# Patient Record
Sex: Male | Born: 1945 | Race: White | Hispanic: No | Marital: Married | Smoking: Former smoker
Health system: Southern US, Community
[De-identification: ages and names within clinical notes are randomized; demographics above are authoritative.]

## PROBLEM LIST (undated history)

## (undated) DIAGNOSIS — G709 Myoneural disorder, unspecified: Secondary | ICD-10-CM

## (undated) DIAGNOSIS — E7800 Pure hypercholesterolemia, unspecified: Secondary | ICD-10-CM

## (undated) DIAGNOSIS — I10 Essential (primary) hypertension: Secondary | ICD-10-CM

## (undated) DIAGNOSIS — R7303 Prediabetes: Secondary | ICD-10-CM

## (undated) DIAGNOSIS — M1990 Unspecified osteoarthritis, unspecified site: Secondary | ICD-10-CM

## (undated) DIAGNOSIS — Z87442 Personal history of urinary calculi: Secondary | ICD-10-CM

## (undated) DIAGNOSIS — C187 Malignant neoplasm of sigmoid colon: Secondary | ICD-10-CM

## (undated) HISTORY — DX: Pure hypercholesterolemia, unspecified: E78.00

## (undated) HISTORY — PX: COLON SURGERY: SHX602

## (undated) HISTORY — PX: PORTACATH PLACEMENT: SHX2246

## (undated) HISTORY — DX: Essential (primary) hypertension: I10

## (undated) HISTORY — DX: Malignant neoplasm of sigmoid colon: C18.7

---

## 2005-07-11 ENCOUNTER — Ambulatory Visit (HOSPITAL_COMMUNITY): Admission: RE | Admit: 2005-07-11 | Discharge: 2005-07-11 | Payer: Self-pay | Admitting: Urology

## 2005-07-13 ENCOUNTER — Observation Stay (HOSPITAL_COMMUNITY): Admission: RE | Admit: 2005-07-13 | Discharge: 2005-07-15 | Payer: Self-pay | Admitting: Urology

## 2005-07-13 ENCOUNTER — Encounter (INDEPENDENT_AMBULATORY_CARE_PROVIDER_SITE_OTHER): Payer: Self-pay | Admitting: Urology

## 2005-07-16 ENCOUNTER — Ambulatory Visit: Payer: Self-pay | Admitting: Internal Medicine

## 2005-07-18 ENCOUNTER — Ambulatory Visit: Payer: Self-pay | Admitting: Internal Medicine

## 2005-07-18 ENCOUNTER — Ambulatory Visit (HOSPITAL_COMMUNITY): Admission: RE | Admit: 2005-07-18 | Discharge: 2005-07-18 | Payer: Self-pay | Admitting: Internal Medicine

## 2005-07-24 ENCOUNTER — Inpatient Hospital Stay (HOSPITAL_COMMUNITY): Admission: AD | Admit: 2005-07-24 | Discharge: 2005-08-02 | Payer: Self-pay | Admitting: General Surgery

## 2005-07-25 ENCOUNTER — Encounter (INDEPENDENT_AMBULATORY_CARE_PROVIDER_SITE_OTHER): Payer: Self-pay | Admitting: General Surgery

## 2005-08-13 ENCOUNTER — Ambulatory Visit (HOSPITAL_COMMUNITY): Payer: Self-pay | Admitting: Oncology

## 2005-08-13 ENCOUNTER — Encounter (HOSPITAL_COMMUNITY): Admission: RE | Admit: 2005-08-13 | Discharge: 2005-09-12 | Payer: Self-pay | Admitting: Oncology

## 2005-08-13 ENCOUNTER — Encounter: Admission: RE | Admit: 2005-08-13 | Discharge: 2005-08-13 | Payer: Self-pay | Admitting: Oncology

## 2005-08-20 ENCOUNTER — Ambulatory Visit (HOSPITAL_COMMUNITY): Admission: RE | Admit: 2005-08-20 | Discharge: 2005-08-20 | Payer: Self-pay | Admitting: Oncology

## 2005-08-24 ENCOUNTER — Ambulatory Visit: Admission: RE | Admit: 2005-08-24 | Discharge: 2005-10-19 | Payer: Self-pay | Admitting: Radiation Oncology

## 2005-08-29 ENCOUNTER — Ambulatory Visit (HOSPITAL_COMMUNITY): Admission: RE | Admit: 2005-08-29 | Discharge: 2005-08-29 | Payer: Self-pay | Admitting: General Surgery

## 2005-09-24 ENCOUNTER — Encounter (HOSPITAL_COMMUNITY): Admission: RE | Admit: 2005-09-24 | Discharge: 2005-10-24 | Payer: Self-pay | Admitting: Oncology

## 2005-09-24 ENCOUNTER — Encounter: Admission: RE | Admit: 2005-09-24 | Discharge: 2005-09-24 | Payer: Self-pay | Admitting: Oncology

## 2005-10-01 ENCOUNTER — Ambulatory Visit (HOSPITAL_COMMUNITY): Payer: Self-pay | Admitting: Oncology

## 2005-10-29 ENCOUNTER — Encounter: Admission: RE | Admit: 2005-10-29 | Discharge: 2005-10-29 | Payer: Self-pay | Admitting: Oncology

## 2005-10-29 ENCOUNTER — Encounter (HOSPITAL_COMMUNITY): Admission: RE | Admit: 2005-10-29 | Discharge: 2005-11-28 | Payer: Self-pay | Admitting: Oncology

## 2005-11-19 ENCOUNTER — Ambulatory Visit (HOSPITAL_COMMUNITY): Payer: Self-pay | Admitting: Oncology

## 2005-12-11 ENCOUNTER — Encounter: Admission: RE | Admit: 2005-12-11 | Discharge: 2005-12-11 | Payer: Self-pay | Admitting: Oncology

## 2005-12-11 ENCOUNTER — Encounter (HOSPITAL_COMMUNITY): Admission: RE | Admit: 2005-12-11 | Discharge: 2006-01-10 | Payer: Self-pay | Admitting: Oncology

## 2006-01-14 ENCOUNTER — Encounter: Admission: RE | Admit: 2006-01-14 | Discharge: 2006-01-14 | Payer: Self-pay | Admitting: Oncology

## 2006-01-14 ENCOUNTER — Encounter (HOSPITAL_COMMUNITY): Admission: RE | Admit: 2006-01-14 | Discharge: 2006-02-13 | Payer: Self-pay | Admitting: Oncology

## 2006-02-04 ENCOUNTER — Ambulatory Visit (HOSPITAL_COMMUNITY): Payer: Self-pay | Admitting: Oncology

## 2006-02-19 ENCOUNTER — Encounter: Admission: RE | Admit: 2006-02-19 | Discharge: 2006-02-19 | Payer: Self-pay | Admitting: Oncology

## 2006-02-19 ENCOUNTER — Encounter (HOSPITAL_COMMUNITY): Admission: RE | Admit: 2006-02-19 | Discharge: 2006-03-21 | Payer: Self-pay | Admitting: Oncology

## 2006-03-21 ENCOUNTER — Ambulatory Visit: Payer: Self-pay | Admitting: Internal Medicine

## 2006-03-25 ENCOUNTER — Ambulatory Visit (HOSPITAL_COMMUNITY): Admission: RE | Admit: 2006-03-25 | Discharge: 2006-03-25 | Payer: Self-pay | Admitting: Oncology

## 2006-03-26 ENCOUNTER — Encounter: Admission: RE | Admit: 2006-03-26 | Discharge: 2006-03-26 | Payer: Self-pay | Admitting: Oncology

## 2006-03-26 ENCOUNTER — Encounter (HOSPITAL_COMMUNITY): Admission: RE | Admit: 2006-03-26 | Discharge: 2006-04-25 | Payer: Self-pay | Admitting: Oncology

## 2006-03-26 ENCOUNTER — Ambulatory Visit (HOSPITAL_COMMUNITY): Payer: Self-pay | Admitting: Oncology

## 2006-03-28 ENCOUNTER — Ambulatory Visit (HOSPITAL_COMMUNITY): Admission: RE | Admit: 2006-03-28 | Discharge: 2006-03-28 | Payer: Self-pay | Admitting: Urology

## 2006-04-02 ENCOUNTER — Ambulatory Visit (HOSPITAL_COMMUNITY): Admission: RE | Admit: 2006-04-02 | Discharge: 2006-04-02 | Payer: Self-pay | Admitting: Urology

## 2006-05-07 ENCOUNTER — Ambulatory Visit: Payer: Self-pay | Admitting: Internal Medicine

## 2006-05-07 ENCOUNTER — Ambulatory Visit (HOSPITAL_COMMUNITY): Admission: RE | Admit: 2006-05-07 | Discharge: 2006-05-07 | Payer: Self-pay | Admitting: Internal Medicine

## 2006-05-15 ENCOUNTER — Ambulatory Visit (HOSPITAL_COMMUNITY): Payer: Self-pay | Admitting: Oncology

## 2006-05-31 ENCOUNTER — Ambulatory Visit (HOSPITAL_COMMUNITY): Admission: RE | Admit: 2006-05-31 | Discharge: 2006-05-31 | Payer: Self-pay | Admitting: Urology

## 2006-06-27 ENCOUNTER — Encounter (HOSPITAL_COMMUNITY): Admission: RE | Admit: 2006-06-27 | Discharge: 2006-07-27 | Payer: Self-pay | Admitting: Oncology

## 2006-08-12 ENCOUNTER — Ambulatory Visit (HOSPITAL_COMMUNITY): Payer: Self-pay | Admitting: Oncology

## 2006-09-27 ENCOUNTER — Ambulatory Visit (HOSPITAL_COMMUNITY): Payer: Self-pay | Admitting: Oncology

## 2006-09-27 ENCOUNTER — Encounter (HOSPITAL_COMMUNITY): Admission: RE | Admit: 2006-09-27 | Discharge: 2006-10-27 | Payer: Self-pay | Admitting: Oncology

## 2006-10-30 ENCOUNTER — Ambulatory Visit (HOSPITAL_COMMUNITY): Admission: RE | Admit: 2006-10-30 | Discharge: 2006-10-30 | Payer: Self-pay | Admitting: Oncology

## 2006-11-04 ENCOUNTER — Encounter (HOSPITAL_COMMUNITY): Admission: RE | Admit: 2006-11-04 | Discharge: 2006-12-04 | Payer: Self-pay | Admitting: Oncology

## 2006-12-19 ENCOUNTER — Ambulatory Visit (HOSPITAL_COMMUNITY): Payer: Self-pay | Admitting: Oncology

## 2007-01-31 ENCOUNTER — Encounter (HOSPITAL_COMMUNITY): Admission: RE | Admit: 2007-01-31 | Discharge: 2007-03-02 | Payer: Self-pay | Admitting: Oncology

## 2007-03-14 ENCOUNTER — Ambulatory Visit (HOSPITAL_COMMUNITY): Payer: Self-pay | Admitting: Oncology

## 2007-04-22 ENCOUNTER — Encounter (HOSPITAL_COMMUNITY): Admission: RE | Admit: 2007-04-22 | Discharge: 2007-05-22 | Payer: Self-pay | Admitting: Oncology

## 2007-04-28 ENCOUNTER — Ambulatory Visit (HOSPITAL_COMMUNITY): Admission: RE | Admit: 2007-04-28 | Discharge: 2007-04-28 | Payer: Self-pay | Admitting: Oncology

## 2007-05-09 ENCOUNTER — Ambulatory Visit (HOSPITAL_COMMUNITY): Payer: Self-pay | Admitting: Oncology

## 2007-07-15 ENCOUNTER — Encounter (HOSPITAL_COMMUNITY): Admission: RE | Admit: 2007-07-15 | Discharge: 2007-08-14 | Payer: Self-pay | Admitting: Oncology

## 2007-07-15 ENCOUNTER — Ambulatory Visit (HOSPITAL_COMMUNITY): Payer: Self-pay | Admitting: Oncology

## 2007-08-01 IMAGING — CR DG CHEST 2V
2 series · 2 of 2 positions shown · non-contrast
Comparison: None.

CLINICAL DATA: Pre-op evaluation for colon cancer.  
 CHEST - 2 VIEW:

[view not recorded (1 of 2)]
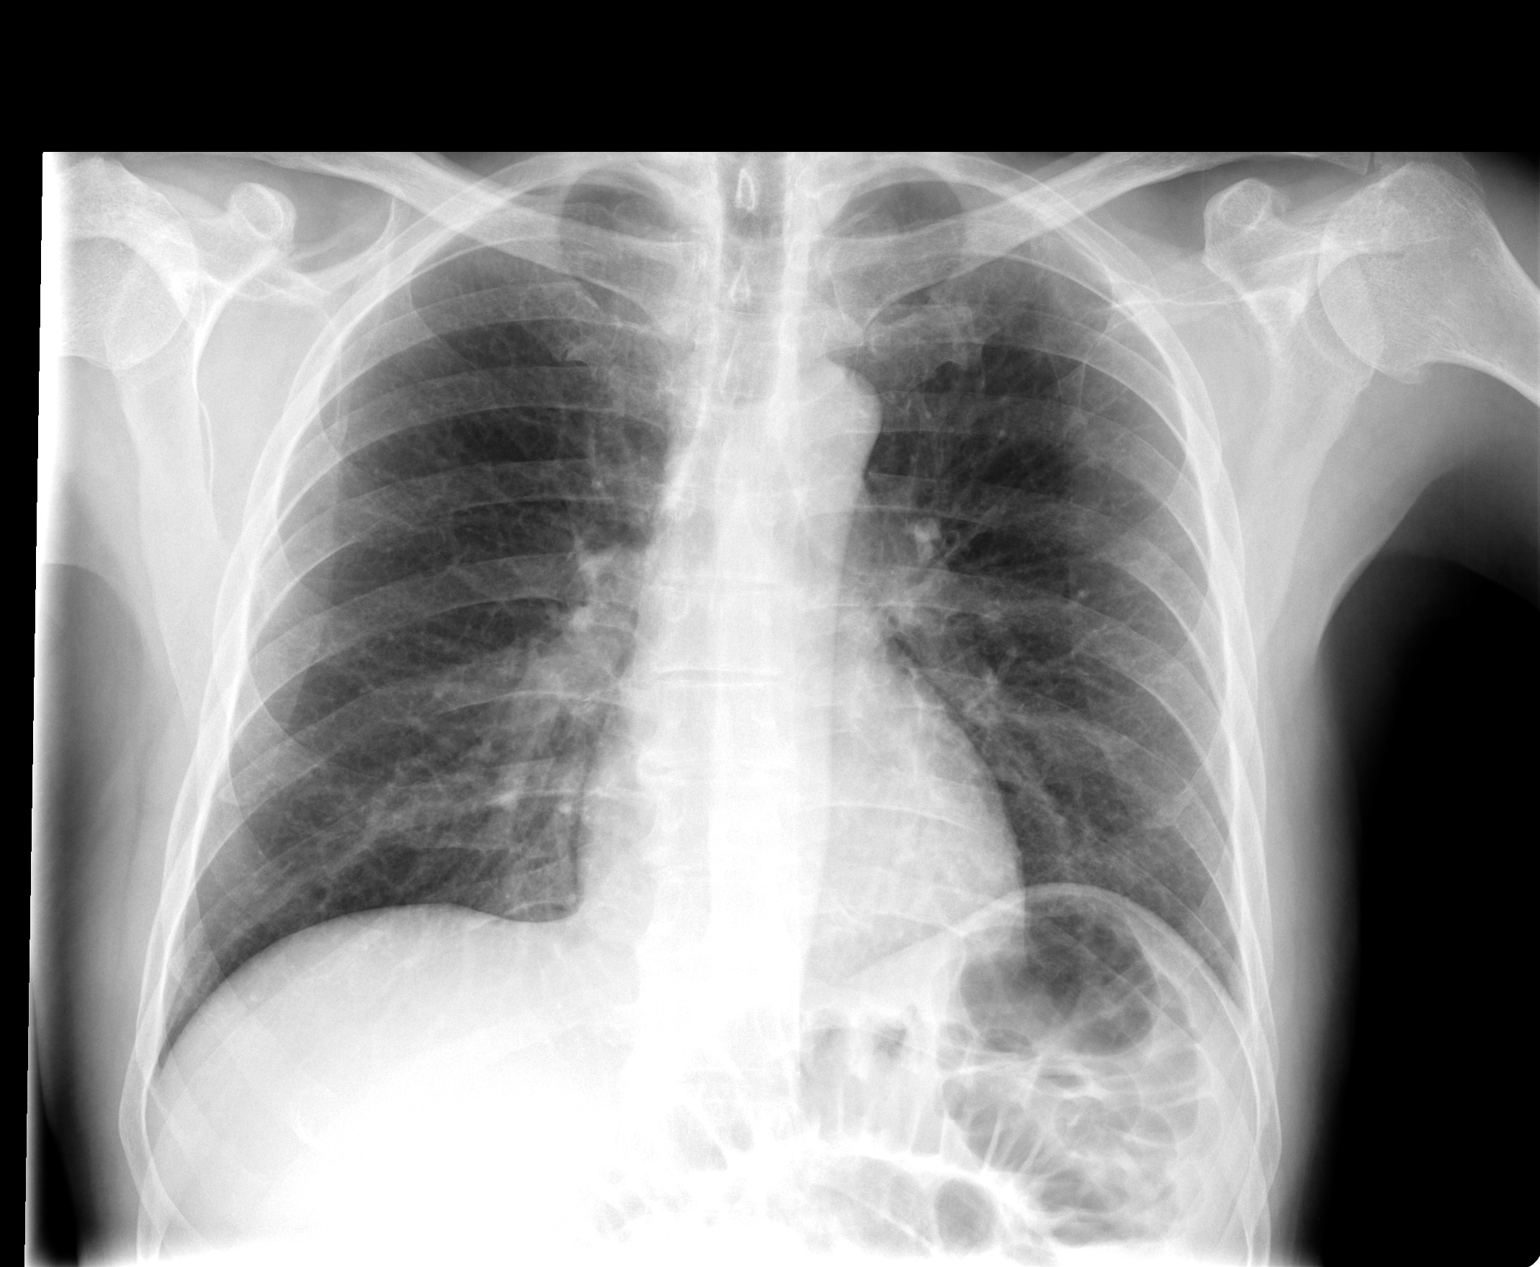

[view not recorded (2 of 2)]
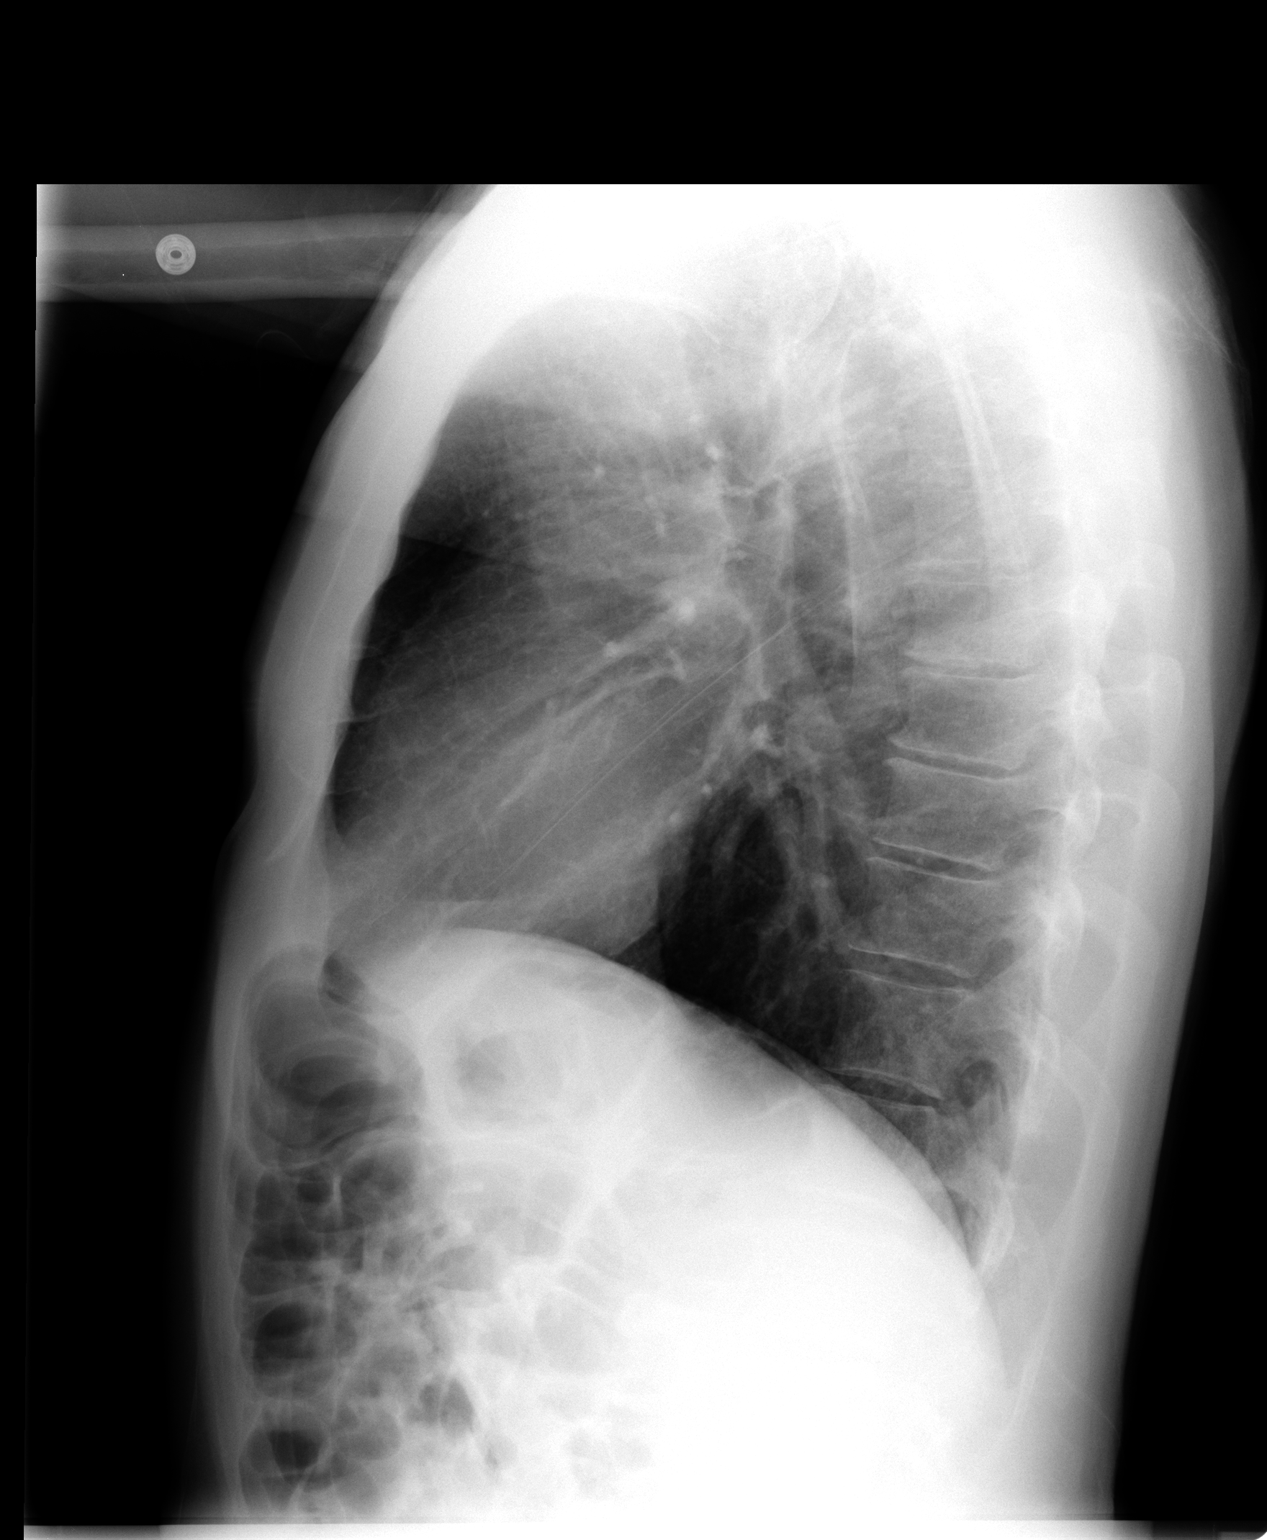

[2 of 2 positions shown; findings below may reference images not displayed]

Lungs are clear without focal consolidation, edema, or pleural effusion.  The cardiopericardial silhouette is within normal limits for size.  Bony structures of the visualized thorax are intact.
IMPRESSION: No acute cardiopulmonary process.

## 2007-10-07 ENCOUNTER — Ambulatory Visit (HOSPITAL_COMMUNITY): Payer: Self-pay | Admitting: Oncology

## 2007-10-07 ENCOUNTER — Encounter (HOSPITAL_COMMUNITY): Admission: RE | Admit: 2007-10-07 | Discharge: 2007-11-06 | Payer: Self-pay | Admitting: Oncology

## 2007-12-02 ENCOUNTER — Ambulatory Visit (HOSPITAL_COMMUNITY): Payer: Self-pay | Admitting: Oncology

## 2007-12-30 ENCOUNTER — Encounter (HOSPITAL_COMMUNITY): Admission: RE | Admit: 2007-12-30 | Discharge: 2008-01-29 | Payer: Self-pay | Admitting: Oncology

## 2008-02-13 ENCOUNTER — Encounter (HOSPITAL_COMMUNITY): Admission: RE | Admit: 2008-02-13 | Discharge: 2008-03-14 | Payer: Self-pay | Admitting: Oncology

## 2008-02-13 ENCOUNTER — Ambulatory Visit (HOSPITAL_COMMUNITY): Payer: Self-pay | Admitting: Oncology

## 2008-03-23 ENCOUNTER — Encounter (HOSPITAL_COMMUNITY): Admission: RE | Admit: 2008-03-23 | Discharge: 2008-04-22 | Payer: Self-pay | Admitting: Oncology

## 2008-05-07 ENCOUNTER — Ambulatory Visit (HOSPITAL_COMMUNITY): Payer: Self-pay | Admitting: Oncology

## 2008-06-09 ENCOUNTER — Encounter (HOSPITAL_COMMUNITY): Admission: RE | Admit: 2008-06-09 | Discharge: 2008-07-09 | Payer: Self-pay | Admitting: Oncology

## 2008-07-21 ENCOUNTER — Ambulatory Visit (HOSPITAL_COMMUNITY): Payer: Self-pay | Admitting: Oncology

## 2008-09-01 ENCOUNTER — Encounter (HOSPITAL_COMMUNITY): Admission: RE | Admit: 2008-09-01 | Discharge: 2008-10-01 | Payer: Self-pay | Admitting: Oncology

## 2008-10-14 ENCOUNTER — Ambulatory Visit (HOSPITAL_COMMUNITY): Payer: Self-pay | Admitting: Oncology

## 2008-12-08 ENCOUNTER — Ambulatory Visit (HOSPITAL_COMMUNITY): Payer: Self-pay | Admitting: Oncology

## 2008-12-08 ENCOUNTER — Encounter (HOSPITAL_COMMUNITY): Admission: RE | Admit: 2008-12-08 | Discharge: 2009-01-07 | Payer: Self-pay | Admitting: Oncology

## 2009-01-20 ENCOUNTER — Encounter (HOSPITAL_COMMUNITY): Admission: RE | Admit: 2009-01-20 | Discharge: 2009-02-19 | Payer: Self-pay | Admitting: Oncology

## 2009-03-01 ENCOUNTER — Ambulatory Visit (HOSPITAL_COMMUNITY): Payer: Self-pay | Admitting: Oncology

## 2009-04-14 ENCOUNTER — Encounter (HOSPITAL_COMMUNITY): Admission: RE | Admit: 2009-04-14 | Discharge: 2009-05-14 | Payer: Self-pay | Admitting: Oncology

## 2009-06-02 ENCOUNTER — Ambulatory Visit (HOSPITAL_COMMUNITY): Payer: Self-pay | Admitting: Oncology

## 2009-07-01 ENCOUNTER — Encounter (HOSPITAL_COMMUNITY): Admission: RE | Admit: 2009-07-01 | Discharge: 2009-07-31 | Payer: Self-pay | Admitting: Oncology

## 2009-08-26 ENCOUNTER — Ambulatory Visit (HOSPITAL_COMMUNITY): Payer: Self-pay | Admitting: Oncology

## 2009-10-07 ENCOUNTER — Encounter (HOSPITAL_COMMUNITY): Admission: RE | Admit: 2009-10-07 | Discharge: 2009-11-06 | Payer: Self-pay | Admitting: Oncology

## 2009-11-18 ENCOUNTER — Ambulatory Visit (HOSPITAL_COMMUNITY): Payer: Self-pay | Admitting: Oncology

## 2009-12-27 ENCOUNTER — Encounter (HOSPITAL_COMMUNITY): Admission: RE | Admit: 2009-12-27 | Discharge: 2010-01-26 | Payer: Self-pay | Admitting: Oncology

## 2010-02-07 ENCOUNTER — Ambulatory Visit (HOSPITAL_COMMUNITY): Payer: Self-pay | Admitting: Oncology

## 2010-03-21 ENCOUNTER — Encounter (HOSPITAL_COMMUNITY): Admission: RE | Admit: 2010-03-21 | Discharge: 2010-03-24 | Payer: Self-pay | Admitting: Oncology

## 2010-05-02 ENCOUNTER — Encounter (HOSPITAL_COMMUNITY)
Admission: RE | Admit: 2010-05-02 | Discharge: 2010-06-01 | Payer: Self-pay | Source: Home / Self Care | Attending: Oncology | Admitting: Oncology

## 2010-05-02 ENCOUNTER — Ambulatory Visit (HOSPITAL_COMMUNITY): Payer: Self-pay | Admitting: Oncology

## 2010-06-13 ENCOUNTER — Encounter (HOSPITAL_COMMUNITY)
Admission: RE | Admit: 2010-06-13 | Discharge: 2010-07-13 | Payer: Self-pay | Source: Home / Self Care | Attending: Oncology | Admitting: Oncology

## 2010-06-30 ENCOUNTER — Ambulatory Visit (HOSPITAL_COMMUNITY)
Admission: RE | Admit: 2010-06-30 | Discharge: 2010-07-25 | Payer: Self-pay | Source: Home / Self Care | Attending: Oncology | Admitting: Oncology

## 2010-07-10 ENCOUNTER — Ambulatory Visit (HOSPITAL_COMMUNITY)
Admission: RE | Admit: 2010-07-10 | Discharge: 2010-07-10 | Payer: Self-pay | Source: Home / Self Care | Attending: General Surgery | Admitting: General Surgery

## 2010-07-10 LAB — BASIC METABOLIC PANEL
BUN: 6 mg/dL (ref 6–23)
CO2: 28 mEq/L (ref 19–32)
Calcium: 9.8 mg/dL (ref 8.4–10.5)
Chloride: 98 mEq/L (ref 96–112)
Creatinine, Ser: 0.82 mg/dL (ref 0.4–1.5)
GFR calc Af Amer: >60 mL/min (ref >60)
GFR calc non Af Amer: >60 mL/min (ref >60)
Glucose, Bld: 87 mg/dL (ref 70–99)
Potassium: 3.8 mEq/L (ref 3.5–5.1)
Sodium: 137 mEq/L (ref 135–145)

## 2010-07-10 LAB — CBC
HCT: 45.2 % (ref 39.0–52.0)
Hemoglobin: 16.2 g/dL (ref 13.0–17.0)
MCH: 31 pg (ref 26.0–34.0)
MCHC: 35.8 g/dL (ref 30.0–36.0)
MCV: 86.4 fL (ref 78.0–100.0)
Platelets: 174 10*3/uL (ref 150–400)
RBC: 5.23 MIL/uL (ref 4.22–5.81)
RDW: 13 % (ref 11.5–15.5)
WBC: 4.6 10*3/uL (ref 4.0–10.5)

## 2010-07-10 LAB — DIFFERENTIAL
Basophils Absolute: 0 10*3/uL (ref 0.0–0.1)
Basophils Relative: 0 % (ref 0–1)
Eosinophils Absolute: 0.1 10*3/uL (ref 0.0–0.7)
Eosinophils Relative: 3 % (ref 0–5)
Lymphocytes Relative: 26 % (ref 12–46)
Lymphs Abs: 1.2 10*3/uL (ref 0.7–4.0)
Monocytes Absolute: 0.4 10*3/uL (ref 0.1–1.0)
Monocytes Relative: 8 % (ref 3–12)
Neutro Abs: 2.8 10*3/uL (ref 1.7–7.7)
Neutrophils Relative %: 62 % (ref 43–77)

## 2010-07-10 LAB — SURGICAL PCR SCREEN
MRSA, PCR: NEGATIVE
Staphylococcus aureus: NEGATIVE

## 2010-07-16 ENCOUNTER — Encounter (INDEPENDENT_AMBULATORY_CARE_PROVIDER_SITE_OTHER): Payer: Self-pay | Admitting: Internal Medicine

## 2010-07-25 ENCOUNTER — Encounter (HOSPITAL_COMMUNITY): Admission: RE | Admit: 2010-07-25 | Payer: Self-pay | Source: Home / Self Care | Admitting: Oncology

## 2010-07-31 NOTE — Op Note (Signed)
  NAMEANTWOINE, Walter Orozco                 ACCOUNT NO.:  [id number]  MEDICAL RECORD NO.:  [id number]          PATIENT TYPE:  AMB  LOCATION:  DAY                           FACILITY:  [facility]  PHYSICIAN:  Barbaraann Barthel, M.D. DATE OF BIRTH:  February 15, 1946  DATE OF PROCEDURE:  07/10/2010 DATE OF DISCHARGE:                              OPERATIVE REPORT   SURGEON:  Barbaraann Barthel, MD  PREOPERATIVE DIAGNOSIS:  Status post treatment of colon cancer with Port- A-Cath.  POSTOPERATIVE DIAGNOSIS:  Status post treatment of colon cancer with Port-A-Cath.  PROCEDURE:  Removal of Port-A-Cath from the right subclavian vein.  NOTE:  This is a 65 year old white male who was treated for invasive rectal carcinoma and had an en bloc resection approximately 5 years ago with a permanent colostomy.  He has done well with this chemotherapy and his treatment and 5 years with no sign of recurrence. Oncology referred him for removal of his Port-A-Cath.  SPECIMEN:  Port-A-Cath Silastic catheter and an infusion device.  WOUND CLASSIFICATION:  Clean.  TECHNIQUE:  The patient was placed in supine position after the adequate administration of IV sedation, used 1% Xylocaine without epinephrine around the area of the port site and excised this and removed this catheter.  There was a considerable pseudo catheter around it so that in manipulating the catheter, it came detached from its Silastic catheter. This was not a problem.  The Silastic catheter did not migrate.  We removed this without any problem and ligated its pseudocapsule vein sheath with 3-0 Polysorb.  We removed this, irrigated the wound with normal saline solution, closed the subcutaneous layer with 3-0 Polysorb and closed the skin with 4-0 nylon.  Sterile dressing with Neosporin OpSite and a 2 x 2 was applied.  Prior to closure, all sponge, needle, and instrument counts were found to be correct.  Estimated blood loss was minimal.  He received 500 mL of  crystalloids intraoperatively. No drains were placed.  There were no complications.     Barbaraann Barthel, M.D.     WB/MEDQ  D:  07/10/2010  T:  07/10/2010  Job:  [id number]  cc:   [name]. [name], MD Fax: [phone]  Electronically Signed by Barbaraann Barthel M.D. on 07/31/2010 10:53:25 AM

## 2010-09-04 LAB — COMPREHENSIVE METABOLIC PANEL
ALT: 34 U/L (ref 0–53)
AST: 23 U/L (ref 0–37)
Albumin: 4.1 g/dL (ref 3.5–5.2)
Calcium: 9.3 mg/dL (ref 8.4–10.5)
GFR calc Af Amer: >60 mL/min (ref >60)
Glucose, Bld: 85 mg/dL (ref 70–99)
Potassium: 3.9 mEq/L (ref 3.5–5.1)
Sodium: 141 mEq/L (ref 135–145)
Total Protein: 7 g/dL (ref 6.0–8.3)

## 2010-09-04 LAB — DIFFERENTIAL
Eosinophils Absolute: 0.1 10*3/uL (ref 0.0–0.7)
Lymphs Abs: 1.3 10*3/uL (ref 0.7–4.0)
Monocytes Absolute: 0.5 10*3/uL (ref 0.1–1.0)
Monocytes Relative: 9 % (ref 3–12)
Neutrophils Relative %: 69 % (ref 43–77)

## 2010-09-04 LAB — CEA: CEA: <0.5 ng/mL (ref 0.0–5.0)

## 2010-09-04 LAB — CBC
HCT: 42.5 % (ref 39.0–52.0)
Hemoglobin: 15.5 g/dL (ref 13.0–17.0)
MCH: 31.8 pg (ref 26.0–34.0)
MCHC: 36.5 g/dL — ABNORMAL HIGH (ref 30.0–36.0)
MCV: 87.1 fL (ref 78.0–100.0)
Platelets: 178 10*3/uL (ref 150–400)
RBC: 4.88 MIL/uL (ref 4.22–5.81)
RDW: 13.2 % (ref 11.5–15.5)
WBC: 6.2 10*3/uL (ref 4.0–10.5)

## 2010-09-10 LAB — COMPREHENSIVE METABOLIC PANEL
ALT: 26 U/L (ref 0–53)
Alkaline Phosphatase: 77 U/L (ref 39–117)
CO2: 25 mEq/L (ref 19–32)
Calcium: 9.1 mg/dL (ref 8.4–10.5)
GFR calc non Af Amer: >60 mL/min (ref >60)
Glucose, Bld: 86 mg/dL (ref 70–99)
Potassium: 3.9 mEq/L (ref 3.5–5.1)
Sodium: 139 mEq/L (ref 135–145)

## 2010-09-10 LAB — DIFFERENTIAL
Basophils Absolute: 0 10*3/uL (ref 0.0–0.1)
Basophils Relative: 0 % (ref 0–1)
Eosinophils Absolute: 0.1 10*3/uL (ref 0.0–0.7)
Neutrophils Relative %: 67 % (ref 43–77)

## 2010-09-10 LAB — CEA: CEA: 0.7 ng/mL (ref 0.0–5.0)

## 2010-09-10 LAB — CBC
HCT: 44 % (ref 39.0–52.0)
Hemoglobin: 15.3 g/dL (ref 13.0–17.0)
MCV: 90.6 fL (ref 78.0–100.0)
RDW: 13.5 % (ref 11.5–15.5)

## 2010-09-12 LAB — COMPREHENSIVE METABOLIC PANEL
AST: 24 U/L (ref 0–37)
Albumin: 4 g/dL (ref 3.5–5.2)
Alkaline Phosphatase: 79 U/L (ref 39–117)
Chloride: 106 mEq/L (ref 96–112)
Creatinine, Ser: 0.74 mg/dL (ref 0.4–1.5)
GFR calc Af Amer: >60 mL/min (ref >60)
Potassium: 3.8 mEq/L (ref 3.5–5.1)
Sodium: 138 mEq/L (ref 135–145)
Total Bilirubin: 0.5 mg/dL (ref 0.3–1.2)

## 2010-09-12 LAB — CBC
RBC: 4.73 MIL/uL (ref 4.22–5.81)
WBC: 4.9 10*3/uL (ref 4.0–10.5)

## 2010-09-12 LAB — DIFFERENTIAL
Basophils Absolute: 0 10*3/uL (ref 0.0–0.1)
Eosinophils Relative: 5 % (ref 0–5)
Lymphocytes Relative: 23 % (ref 12–46)
Monocytes Absolute: 0.5 10*3/uL (ref 0.1–1.0)

## 2010-09-12 LAB — CEA: CEA: 0.9 ng/mL (ref 0.0–5.0)

## 2010-09-21 ENCOUNTER — Encounter (HOSPITAL_COMMUNITY): Payer: BC Managed Care – PPO | Attending: Oncology

## 2010-09-21 ENCOUNTER — Other Ambulatory Visit (HOSPITAL_COMMUNITY): Payer: BC Managed Care – PPO

## 2010-09-21 DIAGNOSIS — Z125 Encounter for screening for malignant neoplasm of prostate: Secondary | ICD-10-CM | POA: Insufficient documentation

## 2010-09-21 DIAGNOSIS — C189 Malignant neoplasm of colon, unspecified: Secondary | ICD-10-CM

## 2010-09-21 DIAGNOSIS — Z79899 Other long term (current) drug therapy: Secondary | ICD-10-CM | POA: Insufficient documentation

## 2010-09-21 DIAGNOSIS — I10 Essential (primary) hypertension: Secondary | ICD-10-CM | POA: Insufficient documentation

## 2010-09-21 DIAGNOSIS — Z85038 Personal history of other malignant neoplasm of large intestine: Secondary | ICD-10-CM | POA: Insufficient documentation

## 2010-10-01 LAB — COMPREHENSIVE METABOLIC PANEL
AST: 23 U/L (ref 0–37)
BUN: 7 mg/dL (ref 6–23)
CO2: 27 mEq/L (ref 19–32)
Calcium: 9.3 mg/dL (ref 8.4–10.5)
Creatinine, Ser: 0.73 mg/dL (ref 0.4–1.5)
GFR calc Af Amer: >60 mL/min (ref >60)
GFR calc non Af Amer: >60 mL/min (ref >60)
Glucose, Bld: 96 mg/dL (ref 70–99)

## 2010-10-01 LAB — DIFFERENTIAL
Lymphocytes Relative: 18 % (ref 12–46)
Lymphs Abs: 0.8 10*3/uL (ref 0.7–4.0)
Neutro Abs: 3.1 10*3/uL (ref 1.7–7.7)
Neutrophils Relative %: 71 % (ref 43–77)

## 2010-10-01 LAB — CBC
Hemoglobin: 15.3 g/dL (ref 13.0–17.0)
MCHC: 35 g/dL (ref 30.0–36.0)
RDW: 13.6 % (ref 11.5–15.5)

## 2010-10-02 LAB — CEA: CEA: 0.8 ng/mL (ref 0.0–5.0)

## 2010-10-05 LAB — CEA: CEA: 1 ng/mL (ref 0.0–5.0)

## 2010-11-10 NOTE — H&P (Signed)
NAME:  Walter Orozco, Walter Orozco                 ACCOUNT NO.:  [id number]   MEDICAL RECORD NO.:  [id number]          PATIENT TYPE:  AMB   LOCATION:  DAY                           FACILITY:  [facility]   PHYSICIAN:  Lionel December, M.D.    DATE OF BIRTH:  06/22/1946   DATE OF ADMISSION:  DATE OF DISCHARGE:  LH                                HISTORY & PHYSICAL   PRESENTING COMPLAINT:  Follow-up regarding colon carcinoma.   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  He had a CT, which  suggested apple core lesion in sigmoid colon.  It was right next to the  bladder.  He had a colonoscopy by Dr. Jena Gauss on July 18, 2005.  He had a  neoplastic lesion at 15 cm from the anal verge, which was debulked.  He had  another apple core lesion just proximal to it.  Therefore, examination was  not complete because of a high-grade stricture.  The patient had surgery on  July 25, 2005, by Dr. Malvin Johns and Dr. Jerre Simon;  he had en bloc resection  of abdominal wall tumor, sigmoid colon and dome of the bladder with end-  colostomy.  He had reconstruction of abdominal wall with MPF graft.  He  received adjuvant radiation and chemotherapy.  He has finished his  chemotherapy.  He has done well.  He did have some side effects of  chemotherapy, a skin rash, etc., but states his rash is going away.  He is  scheduled for a PET scan on March 25, 2006, and has an appointment to see  Dr. [name] following that.  He is having semi-formed stools in his  colostomy.  He has not passed any mucus stool per rectum recently, although  he did that for a few weeks following his surgery.  He has a good appetite.  He denies nausea, vomiting or abdominal pain.  At times he has flatulence,  but this goes away spontaneously.  Following his surgery, he lost his weight  down to 118 pounds but  since then he has gained 14 pounds.  He says he feels  great.   He is on MVI daily.   PAST MEDICAL HISTORY:  Unremarkable other than what is discussed above.   ALLERGIES:  None known.   FAMILY HISTORY:  Positive for colon carcinoma in his father at age 17, but  he died of unrelated causes at age 20.  He has two sisters in good health.   SOCIAL HISTORY:  He is married.  He has two children.  He works at [employer] in [place], [place].  He drinks alcohol  occasionally.  He smoked cigarettes for a few years but quit several years  ago.   PHYSICAL EXAMINATION:  GENERAL:  A pleasant, well-developed, thin Caucasian  male who is in no acute distress.  VITAL SIGNS:  He weighs 132-1/2 pounds.  He is 5 feet 6 inches tall.  Pulse  68 per minute, blood pressure 118/74, temperature is 97.7.  HEENT:  Conjunctivae are pink.  Sclerae are nonicteric.  Oropharyngeal  mucosa is normal.  NECK:  No neck masses or thyromegaly noted.  LUNGS:  Clear to auscultation.  ABDOMEN:  Flat.  A well-healed scar.  Colostomy in LLQ with pasty yellowish  stool in the bag.  Abdomen is soft and nontender without hepatosplenomegaly.  RECTAL:  Examination deferred.  SKIN:  He has some peeling of skin of his hands but no ulceration noted.   ASSESSMENT:  Walter Orozco is a 65 year old Caucasian male who is status post  sigmoid colon resection en bloc with the dome of bladder for infiltrating  adenocarcinoma, who also received adjuvant therapy, both radiation therapy  and chemotherapy, and is doing well.  His proximal colon has never been  evaluated given that he had high-grade stricture.  Feel that this needs to  be done and therefore his proximal colon needs to be examined at some point.  As soon as the result of PET scan is known and hopefully is negative, we  will schedule him for surveillance colonoscopy.  Since he had a very low  lesion, I would also look at the rectal stump.  Once we have known  that he  has no residual or recurrent disease, hopefully Dr. Malvin Johns will take down  his colostomy at some point in the future.   RECOMMENDATIONS:  A colonoscopy and examination of the rectal pouch will be  scheduled after a PET scan has been completed.      Lionel December, M.D.  Electronically Signed     NR/MEDQ  D:  03/21/2006  T:  03/21/2006  Job:  [id number]   cc:   Patrica Duel, M.D.  Fax: [phone]   Barbaraann Barthel, M.D.  Fax: [phone]   [name]. [name], MD  Fax: [phone]   [facility]  Fax: [phone]

## 2010-11-10 NOTE — Consult Note (Signed)
NAMEJAKE, FUHRMANN                 ACCOUNT NO.:  [id number]   MEDICAL RECORD NO.:  [id number]          PATIENT TYPE:  INP   LOCATION:  A309                          FACILITY:  [facility]   PHYSICIAN:  Ky Barban, M.D.DATE OF BIRTH:  [birth year]   DATE OF CONSULTATION:  07/24/2005  DATE OF DISCHARGE:                                   CONSULTATION   CHIEF COMPLAINT:  Bladder tumor.   HISTORY OF PRESENT ILLNESS:  This is a 65 year old gentleman I had seen a  couple of weeks ago, and he was found to have a bladder tumor, and he was  having gross hematuria.  I dissected this tumor.  The tumor appears to be  infiltrating.  Part of the workup and CT scan showed that he had a sigmoid  mass which was invading the bladder and also the abdominal wall.  The  pathologist read the tumor from the bladder.  It looks like it is over the  bowel region.  Further workup with CT and MRI has shown there is a huge mass  in the pelvis invading the bladder, sigmoid and the ureters.  In discussing  with the pathologist, he is not sure if it is primary, ureteral or two  separate primaries coming from the sigmoid colon and ureters, invading the  bladder.  Because ureteral carcinoma is also pathologically similar to the  bowel carcinoma.  He also had colonoscopy done which showed extensive length  of his sigmoid colon involved with it.  Dr. Malvin Johns has admitted the  patient.  We plan to do surgery on him tomorrow, so, I was asked to see him  also.  He is not having any voiding difficulty now.   PAST MEDICAL HISTORY:  1.  He has been healthy all his life.  2.  History of kidney stone 18-20 years ago.  He passed that stone.  3.  No other medical problems like diabetes and hypertension.   FAMILY HISTORY:  No history of prostate cancer.   SOCIAL HISTORY:  He does not smoke or drink.   REVIEW OF SYSTEMS:  Denies any chest pain, orthopnea, PND, nausea, vomiting.   PHYSICAL EXAMINATION:  GENERAL APPEARANCE:   Pale looking gentleman, not in  acute distress.  VITAL SIGNS:  Blood pressure 120/80, temperature normal.  ABDOMEN:  Soft, flat.  Liver, spleen and kidneys nonpalpable.  There is a  tender mass above the supraclavicular area in the midline.  EXTERNAL  GENITALIA:  Circumcised.  Testicles are normal.  RECTAL:  Normal sphincter tone.  No rectal mass.  Prostate 1+, smooth and  firm.   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   RECOMMENDATIONS:  What I suggested and discussed with the patient in detail  is that I am going to have Dr. Malvin Johns, so that we can remove these tumors.  We will have to remove part of his rectus muscle.  Hopefully, we can have a  rectal sheath to close it.  We may have to use synthetic material to close  it.  As  far as the bladder is concerned, I am going to see if I can get away  with doing partial cystectomy.  In case it is difficult to close the  bladder, I may have to use a piece of the bowel to enlarge the bladder.  These things I told him, we cannot tell unless when we go in and actually do  the surgery.  It is a quite complicated and serious operation.  He may  require additional surgery if there is any leak anywhere.  The patient and  his wife are made aware of it and they understand and wanted [place] to go and  proceed.   I appreciate Dr. Malvin Johns letting us see this hypertension.      Ky Barban, M.D.  Electronically Signed     MIJ/MEDQ  D:  07/24/2005  T:  07/25/2005  Job:  [id number]

## 2010-11-10 NOTE — Discharge Summary (Signed)
Walter Orozco, Walter Orozco                 ACCOUNT NO.:  [id number]   MEDICAL RECORD NO.:  [id number]          PATIENT TYPE:  INP   LOCATION:  IC09                          FACILITY:  [facility]   PHYSICIAN:  Barbaraann Barthel, M.D. 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   PROCEDURE:  On July 25, 2005, en bloc resection of abdominal wall, dome  of bladder and sigmoid colon with end colostomy and repair of abdominal wall  with MFT graft.   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 Preadmission, he had a cystoscopy  which revealed an adenocarcinoma infiltrating the done of the bladder. He  also had a CT scan which further delineated this. Colonoscopy revealed two  lesions within the sigmoid colon, one apple-core lesion approximately 15 cm  and another had invaded directly into the bladder. He also shortly after  cystoscopy developed a hard suprapubic mass in his abdominal wall. This was  evaluated with CT scan and MRI which revealed tumor in this area. He was  admitted on July 24, 2005 at which time he had an antibiotic and a  mechanical prep to his colon. He was taken to surgery on July 25, 2005  where an en block resection was carried out in conjunction with the urology  department. We removed the abdominal wall mass, and according to the  pathology report, there were negative margins to this. There were obvious  positive margins where the sigmoid colon had invaded the bladder, and the  bladder margins were very close, where the bladder was repaired, there were  free margins, there was no residual tumor left within the sigmoid colon.  There were noted 3 out of 15 positive lymph nodes with a tumor staging of T4  N1 MX by the pathology department. This was a maximum tumor size of 6 cm and  moderate grade adenocarcinoma. Postoperatively, he did very well. He had  ureteral stents placed intraoperatively and a three-way Foley catheter as  well as a drain in his pelvis and a drain below the graft. The pelvic drain  and the graft drain were removed on approximately the fifth postoperative  day at which time his NG tube was removed. He tolerated the surgery very  well. He did require 3 units of packed red blood cells perioperatively as he  began this surgery with some anemia. He, however, did very well. At the time  of discharge, he has his Foley catheter in place, and this will be attended  to by the urology service. His wound is clean without sign of infection. He  is tolerating p.o. well, and his colostomy shows a viable stoma with good  function, and he has had colostomy care instruction. The rest of his  postoperative course was really unremarkable. He was in the ICU  postoperatively  due to lack of beds. He was not able to be discharged from  the ICU, but he was cleared for discharge on the sixth postoperative day.   LABORATORY DATA:  Pathology report:  As mentioned above, tumor size is 6 cm,  adenocarcinoma with moderate grade, 3 out of 15 positive lymph nodes with T4  N1 MX lesion. His CEA level preoperatively was 1.1. On July 30, 2005, his  white count was 6.8 with a H&H of 9.8 and 28.6. Electrolytes were within  normal limits. CT scans and chest x-ray revealed no acute pulmonary process.  CT scan revealed the findings mentioned regarding his abdominal wall tumor  and the extent of his sigmoid colon.   DISCHARGE INSTRUCTIONS:  He is discharged and has made arrangements for  colostomy cares. He was told to clean his wound with alcohol three times a  day and before and after colostomy care. He is discharged on a regular diet.  He is told to do no heavy lifting, no driving. He is permitted to  shower. He  is permitted to go up and down the  stairs and increase his activity as tolerated. He is discharged on  multivitamin, Bactrim DS 1 tablet 2 times a day for 10 days. He is told not  to remove his Foley catheter, if he has any problems with his Foley to  contact Dr. Jerre Simon. We have made follow-up arrangements to see him, and Dr.  Jerre Simon will see him prior to his discharge.      Barbaraann Barthel, M.D.  Electronically Signed     WB/MEDQ  D:  08/02/2005  T:  08/02/2005  Job:  [id number]   cc:   Patrica Duel, M.D.  Fax: [phone]   Ky Barban, M.D.  Fax: [phone]   [name]. [name], MD  Fax: [phone]

## 2010-11-10 NOTE — Op Note (Signed)
Walter Orozco, Walter Orozco                 ACCOUNT NO.:  [id number]   MEDICAL RECORD NO.:  [id number]          PATIENT TYPE:  AMB   LOCATION:  DAY                           FACILITY:  [facility]   PHYSICIAN:  Lionel December, M.D.    DATE OF BIRTH:  07-Sep-1945   DATE OF PROCEDURE:  05/07/2006  DATE OF DISCHARGE:                                 OPERATIVE REPORT   PROCEDURE:  Surveillance colonoscopy.   INDICATIONS:  Walter Orozco is a 65 year old Caucasian male who was diagnosed with  colon carcinoma invading into the bladder.  His initial colonoscopy in  January2007 by Dr. Jena Gauss was limited because of high-grade stricture due  to neoplasm.  He has undergone en bloc resection of abdominal wall tumor  sigmoid colon and dome of bladder with end colostomy on 07/25/2005.  He  received adjuvant radiation and chemotherapy.  Recent PET scan by Dr.  [name] is negative.  He is undergoing surveillance colonoscopy.  As a  matter of fact his proximal colon has never been evaluated for reasons  stated above.  Procedure risks were reviewed the patient, informed consent  was obtained.   MEDS FOR CONSCIOUS SEDATION:  Demerol 50 mg IV, Versed 5 mg IV.   FINDINGS:  Procedure performed in endoscopy suite.  The patient's vital  signs and O2 sat were monitored during procedure and remained stable.  The  patient was left in supine position.  Digital exam of colostomy was normal.  Olympus videoscope was advanced via colostomy into the proximal sigmoid or  descending colon.  It was easily advanced into splenic flexure beyond.  Preparation was satisfactory.  Scope was passed into cecum which was  identified by appendiceal orifice and ileocecal valve.  Pictures taken for  the record.  Colonic mucosa was carefully examined the way out and there  were no mucosal abnormalities noted.  The patient was then placed in left  lateral recumbent position.  No abnormality noted on external or digital  exam.  Olympus videoscope was placed  rectum and advanced into the  rectosigmoid junction.  There was a short segment of sigmoid colon.  Blunt-  end was well examined.  Mucosa was normal.  Scope was retroflexed to examine  anorectal junction and hemorrhoids were noted below the dentate line.  Pictures taken for the record.  Endoscope was withdrawn.  The patient  tolerated the procedure well.   FINAL DIAGNOSIS:  Normal colonoscopy via colostomy.  Normal rectosigmoid  pouch.   RECOMMENDATIONS:  He will resume his usual meds and diet.   He will discuss with Dr. Malvin Johns when and if he would have takedown of his  colostomy.   We should continue yearly Hemoccults and we will bring him back for another      Lionel December, M.D.  Electronically Signed     NR/MEDQ  D:  05/07/2006  T:  05/07/2006  Job:  [id number]   cc:   Patrica Duel, M.D.  Fax: [phone]   Barbaraann Barthel, M.D.  Fax: [phone]   [name]. [name], MD  Fax: [phone]

## 2010-11-10 NOTE — Group Therapy Note (Signed)
NAMEWAYMAN, HOARD                 ACCOUNT NO.:  [id number]   MEDICAL RECORD NO.:  [id number]          PATIENT TYPE:  REC   LOCATION:  RDNC                         FACILITY:  [facility]   PHYSICIAN:  Boris M. Darovsky, M.D.DATE OF BIRTH:  1946-01-26   DATE OF PROCEDURE:  DATE OF DISCHARGE:  10/19/2005                                   PROGRESS NOTE   [name] is a pleasant 65 year old white gentleman in follow-up of  his stage 4, N1, M0 adenocarcinoma of the sigmoid colon, who previously  underwent resection of his tumor on July 25, 2005 including bladder.  He  had a 3/5 positive lymph node involved in metastatic process.  Apparently  his PET scan was negative postoperative for metastatic disease.  He recently  finished radiation therapy by Artist Pais. Kathrynn Running, M.D. and currently he is  receiving adjuvant chemotherapy, receiving oxaliplatin based chemotherapy  with Avastin and Xeloda.  He already finished four cycles of chemotherapy  and his next treatment is due on January 22, 2006.  Apparently, he tolerated  treatment quite well.  Today he reports that he does have some numbness in  his fingers and some toes but appears to be stable.  Also he has some mild  nausea while he is taking potassium in the morning.  Otherwise, his review  of systems is quite unremarkable.  He denies any fever or chills, denies any  night sweats and weight loss, any cough, any chest pain, any shortness of  breath, any abdominal discomfort, no constipation, no diarrhea.  No  neurological problems except as above.  He denies any skin changes also.  He  reports he is back to work.   PHYSICAL EXAMINATION:  VITAL SIGNS:  Temperature 97.9, heart rate 84,  respiratory rate 16, blood pressure 117/80.  His weight is 142 today.  GENERAL:  Middle aged gentleman appears in no acute distress, alert and  oriented x3, appropriate, __________, no ___________ appreciated.  HEENT:  PERLA.  Mucous membrane moist, no oral  lesions.  LUNGS:  Clear to auscultation bilaterally.  HEART:  Regular rate and rhythm.  No S3, S4.  ABDOMEN:  Soft, benign, no obvious organomegaly.  No obvious masses present.  EXTREMITIES:  No cyanosis, clubbing or edema.  NEUROLOGIC:  Grossly unremarkable.  SKIN:  Do no appreciate any skin changes including tips of his fingers or  his palms.   LABORATORY DATA:  Blood work from today:  none.   IMPRESSION:  1.  Advanced stage III, T4, N1, M0 adenocarcinoma of the sigmoid colon with      invasion into significant portion of the bladder, status post resection      on July 25, 2005 by Dr. Jerre Simon and Dr. Malvin Johns, status post      radiation therapy.  Currently undergoing adjuvant chemotherapy      oxaliplatin based regimen with Avastin and Xeloda, tolerates well.  2.  Neuropathy associated with oxaliplatin treatments, grade 1, stable.  3.  Mild nausea associated with potassium intake.  4.  History of kidney stones.   PLAN:  1.  Patient is to continue his current medications and activities as      previously advised.  2.  Cell count to be checked prior to the next chemotherapy.  3.  Next chemotherapy cycle #5 is scheduled on Oct 23, 2005.  4.  The patient was advised to try to take potassium at bedtime and not with      meals.  5.  Prescriptions Xeloda 500 mg and 150 mg pills to be called into the      pharmacy.  6.  Time spent with patient 20 minutes.           ______________________________  Lebron Conners. Ubaldo Glassing, M.D.     BMD/MEDQ  D:  01/14/2006  T:  01/14/2006  Job:  [id number]   cc:   Barbaraann Barthel, M.D.  Fax: [phone]   Artist Pais. Kathrynn Running, M.D.  Fax: [phone]   Ky Barban, M.D.  Fax: [phone]

## 2010-12-15 ENCOUNTER — Other Ambulatory Visit (HOSPITAL_COMMUNITY): Payer: Self-pay | Admitting: Oncology

## 2010-12-15 ENCOUNTER — Encounter (HOSPITAL_COMMUNITY): Payer: BC Managed Care – PPO | Attending: Oncology

## 2010-12-15 DIAGNOSIS — C189 Malignant neoplasm of colon, unspecified: Secondary | ICD-10-CM

## 2010-12-15 DIAGNOSIS — Z85038 Personal history of other malignant neoplasm of large intestine: Secondary | ICD-10-CM | POA: Insufficient documentation

## 2010-12-15 LAB — CEA: CEA: <0.5 ng/mL (ref 0.0–5.0)

## 2010-12-18 ENCOUNTER — Encounter (HOSPITAL_COMMUNITY): Payer: BC Managed Care – PPO

## 2010-12-18 DIAGNOSIS — C189 Malignant neoplasm of colon, unspecified: Secondary | ICD-10-CM

## 2011-03-13 ENCOUNTER — Encounter (HOSPITAL_COMMUNITY): Payer: PRIVATE HEALTH INSURANCE | Attending: Oncology

## 2011-03-13 DIAGNOSIS — C189 Malignant neoplasm of colon, unspecified: Secondary | ICD-10-CM

## 2011-03-13 DIAGNOSIS — Z85038 Personal history of other malignant neoplasm of large intestine: Secondary | ICD-10-CM | POA: Insufficient documentation

## 2011-03-13 NOTE — Progress Notes (Signed)
Labs drawn today for cea 

## 2011-03-14 LAB — CEA: CEA: <0.5 ng/mL (ref 0.0–5.0)

## 2011-03-15 LAB — CEA: CEA: <0.5

## 2011-03-22 LAB — COMPREHENSIVE METABOLIC PANEL
AST: 24
BUN: 6
CO2: 29
Calcium: 8.9
Creatinine, Ser: 0.8
GFR calc Af Amer: >60
GFR calc non Af Amer: >60
Glucose, Bld: 114 — ABNORMAL HIGH

## 2011-03-22 LAB — CBC
Hemoglobin: 15
MCHC: 34.4
RDW: 13.3

## 2011-03-22 LAB — DIFFERENTIAL
Lymphocytes Relative: 20
Lymphs Abs: 0.9
Neutro Abs: 3.3
Neutrophils Relative %: 70

## 2011-03-26 LAB — CEA: CEA: 1.1

## 2011-03-30 LAB — LIPID PANEL
LDL Cholesterol: 168 mg/dL — ABNORMAL HIGH (ref 0–99)
Total CHOL/HDL Ratio: 5.5 RATIO
VLDL: 16 mg/dL (ref 0–40)

## 2011-03-30 LAB — CBC
HCT: 46.6 % (ref 39.0–52.0)
Hemoglobin: 16 g/dL (ref 13.0–17.0)
RBC: 5.11 MIL/uL (ref 4.22–5.81)
WBC: 5 10*3/uL (ref 4.0–10.5)

## 2011-03-30 LAB — COMPREHENSIVE METABOLIC PANEL
ALT: 25 U/L (ref 0–53)
AST: 24 U/L (ref 0–37)
Alkaline Phosphatase: 82 U/L (ref 39–117)
CO2: 27 mEq/L (ref 19–32)
GFR calc Af Amer: >60 mL/min (ref >60)
GFR calc non Af Amer: >60 mL/min (ref >60)
Glucose, Bld: 110 mg/dL — ABNORMAL HIGH (ref 70–99)
Potassium: 4.3 mEq/L (ref 3.5–5.1)
Sodium: 139 mEq/L (ref 135–145)
Total Protein: 6.6 g/dL (ref 6.0–8.3)

## 2011-03-30 LAB — DIFFERENTIAL
Basophils Relative: 0 % (ref 0–1)
Eosinophils Absolute: 0.1 10*3/uL (ref 0.0–0.7)
Eosinophils Relative: 1 % (ref 0–5)
Monocytes Relative: 9 % (ref 3–12)
Neutrophils Relative %: 73 % (ref 43–77)

## 2011-04-09 LAB — CEA: CEA: 0.5

## 2011-06-05 ENCOUNTER — Encounter (HOSPITAL_COMMUNITY): Payer: PRIVATE HEALTH INSURANCE | Attending: Oncology

## 2011-06-05 DIAGNOSIS — C189 Malignant neoplasm of colon, unspecified: Secondary | ICD-10-CM

## 2011-06-05 DIAGNOSIS — C187 Malignant neoplasm of sigmoid colon: Secondary | ICD-10-CM | POA: Insufficient documentation

## 2011-06-05 LAB — CEA: CEA: <0.5 ng/mL (ref 0.0–5.0)

## 2011-06-05 NOTE — Progress Notes (Signed)
Labs drawn today for cea 

## 2011-06-11 ENCOUNTER — Encounter (HOSPITAL_COMMUNITY): Payer: Self-pay | Admitting: Oncology

## 2011-06-11 ENCOUNTER — Encounter (HOSPITAL_BASED_OUTPATIENT_CLINIC_OR_DEPARTMENT_OTHER): Payer: PRIVATE HEALTH INSURANCE | Admitting: Oncology

## 2011-06-11 VITALS — BP 159/75 | HR 76 | Temp 98.0°F | Wt 173.0 lb

## 2011-06-11 DIAGNOSIS — C7919 Secondary malignant neoplasm of other urinary organs: Secondary | ICD-10-CM

## 2011-06-11 DIAGNOSIS — C187 Malignant neoplasm of sigmoid colon: Secondary | ICD-10-CM | POA: Insufficient documentation

## 2011-06-11 HISTORY — DX: Malignant neoplasm of sigmoid colon: C18.7

## 2011-06-11 NOTE — Progress Notes (Signed)
Isabella Stalling, MD [address] [place] [address]  1. Adenocarcinoma of sigmoid colon  rosuvastatin (CRESTOR) 5 MG tablet, Multiple Vitamin (MULTIVITAMIN) tablet, CEA, CEA, CEA, CEA, CBC, Differential, Comprehensive metabolic panel    CURRENT THERAPY:S/P resection by Dr. Malvin Johns and Dr. Jerre Simon on 07/25/2005 followed by chemotherapy and radiation therapy. He them on to have chemotherapy until August 2007   INTERVAL HISTORY: Walter Orozco 65 y.o. male returns for  regular  visit for followup of  Advanced stage III (T4, N1, M0) adenocarcinoma of the sigmoid colon with invasion into significant portion of his bladder with resection by Dr. Malvin Johns and Dr. Jerre Simon on 07/25/2005 followed by chemotherapy and radiation therapy. His last chemotherapy was in August 2007 and he has remained disease-free since.  The patient denies any complaints. He has a negative oncological review of systems. He is status post Port-A-Cath removal.  Patient understands that he will undergo blood work every 3 months. We'll then followup with him in 6 months time.  He denies any change in his bowel habits. He denies any blood in his stool. His colostomy is operating as expected.   Past Medical History  Diagnosis Date  . Adenocarcinoma of sigmoid colon 06/11/2011  . Hypertension   . High cholesterol     has Adenocarcinoma of sigmoid colon on his problem list.      has no known allergies.  Mr. Plush does not currently have medications on file.  Past Surgical History  Procedure Date  . Colon surgery     Denies any headaches, dizziness, double vision, fevers, chills, night sweats, nausea, vomiting, diarrhea, constipation, chest pain, heart palpitations, shortness of breath, blood in stool, black tarry stool, urinary pain, urinary burning, urinary frequency, hematuria.   PHYSICAL EXAMINATION  ECOG PERFORMANCE STATUS: 0 - Asymptomatic  Filed Vitals:   06/11/11 0939  BP: 159/75  Pulse: 76    Temp: 98 F (36.7 C)    GENERAL:alert, no distress, well nourished, well developed, comfortable, cooperative and smiling SKIN: skin color, texture, turgor are normal HEAD: Normocephalic EYES: normal EARS: External ears normal OROPHARYNX:mucous membranes are moist  NECK: supple, no adenopathy, no bruits, thyroid normal size, non-tender, without nodularity, no stridor, non-tender, trachea midline LYMPH:  no palpable lymphadenopathy, no hepatosplenomegaly BREAST:not examined LUNGS: clear to auscultation and percussion HEART: regular rate & rhythm, no murmurs, no gallops, S1 normal and S2 normal ABDOMEN:abdomen soft, non-tender, normal bowel sounds, colostomy producing stool as expected and no hepatosplenomegaly BACK: Back symmetric, no curvature., No CVA tenderness EXTREMITIES:less then 2 second capillary refill, no joint deformities, effusion, or inflammation, no edema, no skin discoloration, no clubbing, no cyanosis  NEURO: alert & oriented x 3 with fluent speech, no focal motor/sensory deficits, gait normal   LABORATORY DATA: Lab Results  Component Value Date   CEA <0.5 06/05/2011     RADIOGRAPHIC STUDIES:  06/29/2010  Clinical Data: Adenocarcinoma of the sigmoid colon post surgery,  chemotherapy and radiation therapy, follow-up, question metastatic  disease  CT CHEST, ABDOMEN AND PELVIS WITH CONTRAST  Technique: Multidetector CT imaging of the chest, abdomen and  pelvis was performed following the standard protocol during bolus  administration of intravenous contrast. Breast shield utilized.  Sagittal and coronal MPR images reconstructed from axial data set.  Contrast: Dilute oral contrast and 100 ml Omnipaque-300 IV  Comparison: Noncontrast CT images from PET CT 04/28/2007  CT CHEST  Findings:  Aorta normal caliber with minimal atherosclerotic calcification.  Minimal coronary arterial calcifications.  Right chest  wall Port-A-Cath, tip in SVC.  No thoracic adenopathy.   3 mm diameter left lower lobe nodule image 28, probably not  significantly changed accounting for differences in technique.  Minimal atelectasis posterior left sulcus.  Lungs otherwise clear.  No pleural effusion or pneumothorax.  Few scattered Schmorl's nodes.  IMPRESSION:  Stable left lower lobe pulmonary nodule.  No evidence of intrathoracic metastatic disease.  CT ABDOMEN AND PELVIS  Findings:  Multiple bilateral nonobstructing renal calculi, largest 4 mm  diameter lower pole right kidney.  Stable partially calcified mass within body of pancreas, 1.5 x 1.3  cm image 61.  Multiple tiny hepatic cysts.  Remainder of liver, spleen, pancreas, kidneys, and adrenal glands  unremarkable.  Retroaortic left renal vein.  Descending colostomy left pelvis.  Few diverticula descending colon.  Stomach and small bowel loops unremarkable.  Rectal stump unremarkable.  Mild prostatic enlargement, gland 4.6 x 3.4 cm image 114.  No additional mass, adenopathy, free fluid, or inflammatory  process.  Unremarkable bladder and distal ureters.  New lytic focus with sclerotic rim identified in the L3 vertebral  body, most likely a large Schmorl's node.  IMPRESSION:  No evidence of intra abdominal or intrapelvic metastatic disease.  Stable small calcified pancreatic mass.  Few uncomplicated descending colonic diverticula.  Multiple bilateral nonobstructing renal calculi.  Provider: Wilmer Floor, Ronnald Collum    ASSESSMENT:  1. Advanced stage III (T4, N1, M0) adenocarcinoma of the sigmoid colon with invasion into significant portion of his bladder with resection by Dr. Malvin Johns and Dr. Jerre Simon on 07/25/2005 followed by chemotherapy and radiation therapy. His last chemotherapy was in August 2007 and he has remained disease-free since.   PLAN:  1. Lab work every 3 months: CEA 2. Lab work in 6 months: CEA, CBC diff, CMET 3. Return in 6 months for follow-up.    All questions were answered. The  patient knows to call the clinic with any problems, questions or concerns. We can certainly see the patient much sooner if necessary.   Natilee Gauer

## 2011-06-11 NOTE — Progress Notes (Signed)
Addended by: Sterling Big on: 06/11/2011 11:11 AM   Modules accepted: Orders

## 2011-06-11 NOTE — Patient Instructions (Signed)
Walter Orozco  [id number] 10-02-1945   [facility]  Discharge Instructions  RECOMMENDATIONS MADE BY THE CONSULTANT AND ANY TEST RESULTS WILL BE SENT TO YOUR REFERRING DOCTOR.   EXAM FINDINGS BY MD TODAY AND SIGNS AND SYMPTOMS TO REPORT TO CLINIC OR PRIMARY MD: You look great!  CEA has been normal.  Report changes in bowel habits, blood in stool, bone pain, etc.  Call us with name of BP medication and dosage of your crestor [phone]).  MEDICATIONS PRESCRIBED: none     SPECIAL INSTRUCTIONS/FOLLOW-UP: Lab work Needed every 3 months and see Dr. [name] in 6 months.   I acknowledge that I have been informed and understand all the instructions given to me and received a copy. I do not have any more questions at this time, but understand that I may call the Specialty Clinic at [facility] at [phone] during business hours should I have any further questions or need assistance in obtaining follow-up care.    __________________________________________  _____________  __________ Signature of Patient or Authorized Representative            Date                   Time    __________________________________________ Nurse's Signature

## 2011-08-28 ENCOUNTER — Encounter (HOSPITAL_COMMUNITY): Payer: PRIVATE HEALTH INSURANCE | Attending: Oncology

## 2011-08-28 DIAGNOSIS — C187 Malignant neoplasm of sigmoid colon: Secondary | ICD-10-CM

## 2011-08-28 DIAGNOSIS — C7919 Secondary malignant neoplasm of other urinary organs: Secondary | ICD-10-CM

## 2011-08-28 LAB — CEA: CEA: 0.6 ng/mL (ref 0.0–5.0)

## 2011-08-29 NOTE — Progress Notes (Signed)
Lab draw

## 2011-11-20 ENCOUNTER — Encounter (HOSPITAL_COMMUNITY): Payer: PRIVATE HEALTH INSURANCE

## 2011-11-20 ENCOUNTER — Encounter (HOSPITAL_COMMUNITY): Payer: PRIVATE HEALTH INSURANCE | Attending: Oncology

## 2011-11-20 DIAGNOSIS — C187 Malignant neoplasm of sigmoid colon: Secondary | ICD-10-CM | POA: Insufficient documentation

## 2011-11-20 LAB — CBC
MCH: 30.7 pg (ref 26.0–34.0)
MCHC: 34.9 g/dL (ref 30.0–36.0)
MCV: 87.8 fL (ref 78.0–100.0)
Platelets: 162 10*3/uL (ref 150–400)
RBC: 5.25 MIL/uL (ref 4.22–5.81)
RDW: 12.8 % (ref 11.5–15.5)

## 2011-11-20 LAB — COMPREHENSIVE METABOLIC PANEL
ALT: 30 U/L (ref 0–53)
AST: 22 U/L (ref 0–37)
Albumin: 4.2 g/dL (ref 3.5–5.2)
Alkaline Phosphatase: 100 U/L (ref 39–117)
Calcium: 9.7 mg/dL (ref 8.4–10.5)
GFR calc Af Amer: >90 mL/min (ref >90)
Potassium: 4.2 mEq/L (ref 3.5–5.1)
Sodium: 140 mEq/L (ref 135–145)
Total Protein: 7 g/dL (ref 6.0–8.3)

## 2011-11-20 LAB — DIFFERENTIAL
Basophils Absolute: 0 10*3/uL (ref 0.0–0.1)
Basophils Relative: 0 % (ref 0–1)
Eosinophils Absolute: 0.3 10*3/uL (ref 0.0–0.7)
Eosinophils Relative: 5 % (ref 0–5)
Lymphs Abs: 1.9 10*3/uL (ref 0.7–4.0)
Neutrophils Relative %: 54 % (ref 43–77)

## 2011-11-26 ENCOUNTER — Encounter (HOSPITAL_COMMUNITY): Payer: Self-pay | Admitting: Oncology

## 2011-11-26 ENCOUNTER — Encounter (HOSPITAL_COMMUNITY): Payer: PRIVATE HEALTH INSURANCE | Attending: Oncology | Admitting: Oncology

## 2011-11-26 VITALS — BP 146/85 | HR 73 | Temp 98.2°F | Wt 176.0 lb

## 2011-11-26 DIAGNOSIS — C187 Malignant neoplasm of sigmoid colon: Secondary | ICD-10-CM | POA: Insufficient documentation

## 2011-11-26 NOTE — Progress Notes (Signed)
Isabella Stalling, MD, MD [address] [place] [address]  1. Adenocarcinoma of sigmoid colon  pravastatin (PRAVACHOL) 40 MG tablet, CEA, CEA    CURRENT THERAPY: Observation with every 3 months CEA  INTERVAL HISTORY: Derrek P Sweetser 66 y.o. male returns for  regular  visit for followup of Advanced stage III (T4, N1, M0) adenocarcinoma of the sigmoid colon with invasion into significant portion of his bladder with resection by Dr. Malvin Johns and Dr. Jerre Simon on 07/25/2005 followed by chemotherapy and radiation therapy. His last chemotherapy was in August 2007 and he has remained disease-free since.  Mr Coakley is doing very well.  He continues to perform some wood-working activities at home, particularly producing "corn-hole" games.    I personally reviewed and went over laboratory results with the patient.  His CEA remains within normal limits.  His other counts are unremarkable.  The patient is getting lab work performed every 3-6 months by his PCP.  Therefore, we will perform on CEA blood test and allow his PCP to monitor his other lab results.   He inquires about a colostomy reversal.  We discussed what this procedure may entail.  He is considering a surgical consultation with a Careers adviser in [place].  He will continue to consider this procedure and contact our clinic if he decides to pursue a surgical consultation.   Oncologically, the patient denies any complaints.  He denies any change in his stool pattern.  He denies any stool color changes, he denies any blood in stool or black tarry stool.  He denies any pencil thin stool.  His colostomy is producing stool appropriately.      Past Medical History  Diagnosis Date  . Adenocarcinoma of sigmoid colon 06/11/2011  . Hypertension   . High cholesterol     has Adenocarcinoma of sigmoid colon on his problem list.      has no known allergies.  Mr. Adan does not currently have medications on file.  Past Surgical History  Procedure  Date  . Colon surgery     Denies any headaches, dizziness, double vision, fevers, chills, night sweats, nausea, vomiting, diarrhea, constipation, chest pain, heart palpitations, shortness of breath, blood in stool, black tarry stool, urinary pain, urinary burning, urinary frequency, hematuria.   PHYSICAL EXAMINATION  ECOG PERFORMANCE STATUS: 0 - Asymptomatic  Filed Vitals:   11/26/11 0900  BP: 146/85  Pulse: 73  Temp: 98.2 F (36.8 C)    GENERAL:alert, healthy, no distress, well nourished, well developed, comfortable, cooperative and smiling SKIN: skin color, texture, turgor are normal, no rashes or significant lesions HEAD: Normocephalic, No masses, lesions, tenderness or abnormalities EYES: normal, Conjunctiva are pink and non-injected EARS: External ears normal OROPHARYNX:lips, buccal mucosa, and tongue normal and mucous membranes are moist  NECK: supple, no adenopathy, thyroid normal size, non-tender, without nodularity, no stridor, non-tender, trachea midline LYMPH:  no palpable lymphadenopathy, no hepatosplenomegaly BREAST:not examined LUNGS: clear to auscultation and percussion HEART: regular rate & rhythm, no murmurs, no gallops, S1 normal and S2 normal ABDOMEN:abdomen soft, non-tender, normal bowel sounds, no masses or organomegaly, colostomy producing stool and no hepatosplenomegaly BACK: Back symmetric, no curvature., No CVA tenderness EXTREMITIES:less then 2 second capillary refill, no joint deformities, effusion, or inflammation, no edema, no skin discoloration, no clubbing, no cyanosis  NEURO: alert & oriented x 3 with fluent speech, no focal motor/sensory deficits, gait normal   LABORATORY DATA: CBC    Component Value Date/Time   WBC 5.7 11/20/2011 0845   RBC  5.25 11/20/2011 0845   HGB 16.1 11/20/2011 0845   HCT 46.1 11/20/2011 0845   PLT 162 11/20/2011 0845   MCV 87.8 11/20/2011 0845   MCH 30.7 11/20/2011 0845   MCHC 34.9 11/20/2011 0845   RDW 12.8 11/20/2011  0845   LYMPHSABS 1.9 11/20/2011 0845   MONOABS 0.5 11/20/2011 0845   EOSABS 0.3 11/20/2011 0845   BASOSABS 0.0 11/20/2011 0845      Chemistry      Component Value Date/Time   NA 140 11/20/2011 0845   K 4.2 11/20/2011 0845   CL 103 11/20/2011 0845   CO2 27 11/20/2011 0845   BUN 13 11/20/2011 0845   CREATININE 0.76 11/20/2011 0845      Component Value Date/Time   CALCIUM 9.7 11/20/2011 0845   ALKPHOS 100 11/20/2011 0845   AST 22 11/20/2011 0845   ALT 30 11/20/2011 0845   BILITOT 0.3 11/20/2011 0845     Lab Results  Component Value Date   CEA 0.7 11/20/2011       ASSESSMENT:  1. Advanced stage III (T4, N1, M0) adenocarcinoma of the sigmoid colon with invasion into significant portion of his bladder with resection by Dr. Malvin Johns and Dr. Jerre Simon on 07/25/2005 followed by chemotherapy and radiation therapy. His last chemotherapy was in August 2007 and he has remained disease-free since.    PLAN:  1. I personally reviewed and went over laboratory results with the patient. 2. Lab work every 3 months: CEA 3. He will consider a surgical consultation regarding reversal of his colostomy. 4. On our next encounter, we will need to discuss screening colonoscopy.  May need to review the patient's paper chart.  Do not identify GI follow-up in [facility] at this time.  5. Will not pursue CT abd/pelvis screening unless symptomatic.  6. Return in 1 year for follow-up.   All questions were answered. The patient knows to call the clinic with any problems, questions or concerns. We can certainly see the patient much sooner if necessary.  Zaeda Mcferran

## 2011-11-26 NOTE — Patient Instructions (Signed)
[facility]  Discharge Instructions  RECOMMENDATIONS MADE BY THE CONSULTANT AND ANY TEST RESULTS WILL BE SENT TO YOUR REFERRING DOCTOR.   We will draw labs (CEA level) again in 3 months and 6 months and have you see the doctor again after 6 month labs.    I acknowledge that I have been informed and understand all the instructions given to me and received a copy. I do not have any more questions at this time, but understand that I may call the Specialty Clinic at [facility] at [phone] during business hours should I have any further questions or need assistance in obtaining follow-up care.    __________________________________________  _____________  __________ Signature of Patient or Authorized Representative            Date                   Time    __________________________________________ Nurse's Signature

## 2012-02-12 ENCOUNTER — Encounter (HOSPITAL_COMMUNITY): Payer: PRIVATE HEALTH INSURANCE | Attending: Oncology

## 2012-02-12 DIAGNOSIS — C187 Malignant neoplasm of sigmoid colon: Secondary | ICD-10-CM | POA: Insufficient documentation

## 2012-02-12 NOTE — Progress Notes (Signed)
Labs drawn today for cea 

## 2012-02-26 ENCOUNTER — Other Ambulatory Visit (HOSPITAL_COMMUNITY): Payer: PRIVATE HEALTH INSURANCE

## 2012-05-27 ENCOUNTER — Encounter (HOSPITAL_COMMUNITY): Payer: PRIVATE HEALTH INSURANCE | Attending: Oncology

## 2012-05-27 DIAGNOSIS — Z85038 Personal history of other malignant neoplasm of large intestine: Secondary | ICD-10-CM | POA: Insufficient documentation

## 2012-05-27 DIAGNOSIS — Z09 Encounter for follow-up examination after completed treatment for conditions other than malignant neoplasm: Secondary | ICD-10-CM | POA: Insufficient documentation

## 2012-05-27 DIAGNOSIS — C187 Malignant neoplasm of sigmoid colon: Secondary | ICD-10-CM

## 2012-05-27 NOTE — Progress Notes (Signed)
Walter Orozco presented for labwork. Labs per MD order drawn via Peripheral Line 23 gauge needle inserted in right AC  Good blood return present. Procedure without incident.  Needle removed intact. Patient tolerated procedure well.

## 2012-05-28 ENCOUNTER — Encounter (HOSPITAL_BASED_OUTPATIENT_CLINIC_OR_DEPARTMENT_OTHER): Payer: PRIVATE HEALTH INSURANCE | Admitting: Oncology

## 2012-05-28 VITALS — BP 157/83 | HR 80 | Temp 97.6°F | Resp 20 | Wt 172.3 lb

## 2012-05-28 DIAGNOSIS — Z85038 Personal history of other malignant neoplasm of large intestine: Secondary | ICD-10-CM

## 2012-05-28 DIAGNOSIS — C187 Malignant neoplasm of sigmoid colon: Secondary | ICD-10-CM

## 2012-05-28 NOTE — Patient Instructions (Addendum)
[facility]  Discharge Instructions  RECOMMENDATIONS MADE BY THE CONSULTANT AND ANY TEST RESULTS WILL BE SENT TO YOUR REFERRING DOCTOR.   EXAM FINDINGS BY MD TODAY AND SIGNS AND SYMPTOMS TO REPORT TO CLINIC OR PRIMARY MD: Exam and discussion per MD.  We will check your CEA in June.  We will contact Dr. Patty Sermons office to see when you are due for your next colonoscopy.  MEDICATIONS PRESCRIBED: none   INSTRUCTIONS GIVEN AND DISCUSSED: Other :  Report changes in bowel habits, blood in your stool, unexplained weight loss, etc.  SPECIAL INSTRUCTIONS/FOLLOW-UP: Lab work Needed in 6 months and Return to Clinic after labs to see PA   Thank you for choosing [facility] to provide your oncology and hematology care.  To afford each patient quality time with our providers, please arrive at least 15 minutes before your scheduled appointment time.  With your help, our goal is to use those 15 minutes to complete the necessary work-up to ensure our physicians have the information they need to help with your evaluation and healthcare recommendations.    Effective January 1st, 2014, we ask that you re-schedule your appointment with our physicians should you arrive 10 or more minutes late for your appointment.  We strive to give you quality time with our providers, and arriving late affects you and other patients whose appointments are after yours.    Again, thank you for choosing [facility].  Our hope is that these requests will decrease the amount of time that you wait before being seen by our physicians.       _____________________________________________________________  I acknowledge that I have been informed and understand all the instructions given to me and received a copy. I do not have anymore questions at this time but understand that I may call the Cancer Center at [facility] at [phone] during business hours should I have any further  questions or need assistance in obtaining follow-up care.    __________________________________________  _____________  __________ Signature of Patient or Authorized Representative            Date                   Time    __________________________________________ Nurse's Signature

## 2012-05-28 NOTE — Progress Notes (Signed)
Problem number 1 advanced stage III (T4, N1, M0) adenocarcinoma sigmoid colon with invasion into a significant portion of the bladder status post resection by Dr. Malvin Johns and Dr. Brent Bulla on 07/25/2005 followed by combination chemotherapy and radiation therapy followed by oxaliplatinum-based chemotherapy in addition to bevacizumab. He remains disease free. He did not have his CAT scan this year and I suspect it was due to cost issues. He still is not strongly in favor of doing one more annual CT scan due to the cost. He has tremendous out-of-pocket payment. His CEAs remain normal and he feels great. His oncology review of systems is negative. He has not had colonoscopy that I know of for some time so we will get Dr. Karilyn Cota to see him back.  His vital signs are stable fact his weight is little higher than it should be for his height. I first met him he  weighed 124 pounds now is 170 to do 180 pounds. He looks great. He is in acute distress. He has no lymphadenopathy in any location. His lungs are clear. Heart shows a regular rhythm and rate without murmur rub or gallop. Abdomen remains very soft nontender without hepatosplenomegaly. Colostomy is intact. Bowel sounds are normal. No arm or leg edema.  He promises to come back in 6 months for lab work. We'll be in touch with Dr. Patty Sermons office. He is out 7 years from surgery coming 07/25/2012. He has agreed to CAT scans if his CEAs every change or if he develops symptoms etc.

## 2012-05-30 ENCOUNTER — Other Ambulatory Visit (INDEPENDENT_AMBULATORY_CARE_PROVIDER_SITE_OTHER): Payer: Self-pay | Admitting: *Deleted

## 2012-05-30 DIAGNOSIS — Z800 Family history of malignant neoplasm of digestive organs: Secondary | ICD-10-CM

## 2012-05-30 DIAGNOSIS — Z85038 Personal history of other malignant neoplasm of large intestine: Secondary | ICD-10-CM

## 2012-06-02 ENCOUNTER — Encounter (INDEPENDENT_AMBULATORY_CARE_PROVIDER_SITE_OTHER): Payer: Self-pay | Admitting: *Deleted

## 2012-06-02 ENCOUNTER — Telehealth (INDEPENDENT_AMBULATORY_CARE_PROVIDER_SITE_OTHER): Payer: Self-pay | Admitting: *Deleted

## 2012-06-02 DIAGNOSIS — Z1211 Encounter for screening for malignant neoplasm of colon: Secondary | ICD-10-CM

## 2012-06-02 MED ORDER — PEG-KCL-NACL-NASULF-NA ASC-C 100 G PO SOLR: 1.0000 | Freq: Once | ORAL | Status: DC

## 2012-06-02 NOTE — Telephone Encounter (Signed)
Patient needs movi prep 

## 2012-06-12 ENCOUNTER — Telehealth (INDEPENDENT_AMBULATORY_CARE_PROVIDER_SITE_OTHER): Payer: Self-pay | Admitting: *Deleted

## 2012-06-12 NOTE — Telephone Encounter (Signed)
  Procedure: tcs  Reason/Indication:  Hx colon ca, fam hx colon ca  Has patient had this procedure before?  yes  If so, when, by whom and where?  2007  Is there a family history of colon cancer?  yes  Who?  What age when diagnosed?  father  Is patient diabetic?   no      Does patient have prosthetic heart valve?  no  Do you have a pacemaker?  no  Has patient had joint replacement within last 12 months?  no  Is patient on Coumadin, Plavix and/or Aspirin? no  Medications: amlodipine 5 mg daily, simvastatin 40 mg daily  Allergies: nkda  Medication Adjustment:   Procedure date & time: 07/10/12 at 12:00

## 2012-06-12 NOTE — Telephone Encounter (Signed)
agree

## 2012-06-23 NOTE — Progress Notes (Signed)
Patient is scheduled for surveillance colonoscopy on 07/10/2012.

## 2012-06-26 ENCOUNTER — Encounter (HOSPITAL_COMMUNITY): Payer: Self-pay | Admitting: Pharmacy Technician

## 2012-07-10 ENCOUNTER — Encounter (HOSPITAL_COMMUNITY)
Admission: RE | Disposition: A | Discharge status: Home / Self Care | Payer: Self-pay | Source: Ambulatory Visit | Attending: Internal Medicine

## 2012-07-10 ENCOUNTER — Encounter (HOSPITAL_COMMUNITY): Payer: Self-pay | Admitting: *Deleted

## 2012-07-10 ENCOUNTER — Ambulatory Visit (HOSPITAL_COMMUNITY)
Admission: RE | Admit: 2012-07-10 | Discharge: 2012-07-10 | Disposition: A | Discharge status: Home / Self Care | Payer: BC Managed Care – PPO | Source: Ambulatory Visit | Attending: Internal Medicine | Admitting: Internal Medicine

## 2012-07-10 DIAGNOSIS — Z800 Family history of malignant neoplasm of digestive organs: Secondary | ICD-10-CM

## 2012-07-10 DIAGNOSIS — K5730 Diverticulosis of large intestine without perforation or abscess without bleeding: Secondary | ICD-10-CM

## 2012-07-10 DIAGNOSIS — Z85038 Personal history of other malignant neoplasm of large intestine: Secondary | ICD-10-CM

## 2012-07-10 DIAGNOSIS — Z860100 Personal history of colon polyps, unspecified: Secondary | ICD-10-CM | POA: Insufficient documentation

## 2012-07-10 DIAGNOSIS — D126 Benign neoplasm of colon, unspecified: Secondary | ICD-10-CM | POA: Insufficient documentation

## 2012-07-10 DIAGNOSIS — Z933 Colostomy status: Secondary | ICD-10-CM | POA: Insufficient documentation

## 2012-07-10 DIAGNOSIS — I10 Essential (primary) hypertension: Secondary | ICD-10-CM | POA: Insufficient documentation

## 2012-07-10 DIAGNOSIS — Z9049 Acquired absence of other specified parts of digestive tract: Secondary | ICD-10-CM | POA: Insufficient documentation

## 2012-07-10 HISTORY — PX: COLONOSCOPY: SHX5424

## 2012-07-10 SURGERY — COLONOSCOPY
Anesthesia: Moderate Sedation

## 2012-07-10 MED ORDER — SODIUM CHLORIDE 0.45 % IV SOLN
INTRAVENOUS | Status: DC
  Administered 2012-07-10: 1000 mL via INTRAVENOUS

## 2012-07-10 MED ORDER — MEPERIDINE HCL 50 MG/ML IJ SOLN
INTRAMUSCULAR | Status: AC
  Filled 2012-07-10: qty 1

## 2012-07-10 MED ORDER — STERILE WATER FOR IRRIGATION IR SOLN
Status: DC | PRN
  Administered 2012-07-10: 12:00:00

## 2012-07-10 MED ORDER — MIDAZOLAM HCL 5 MG/5ML IJ SOLN
INTRAMUSCULAR | Status: AC
  Filled 2012-07-10: qty 10

## 2012-07-10 MED ORDER — MIDAZOLAM HCL 5 MG/5ML IJ SOLN
INTRAMUSCULAR | Status: DC | PRN
  Administered 2012-07-10 (×4): 2 mg via INTRAVENOUS

## 2012-07-10 MED ORDER — MEPERIDINE HCL 50 MG/ML IJ SOLN
INTRAMUSCULAR | Status: DC | PRN
  Administered 2012-07-10 (×2): 25 mg via INTRAVENOUS

## 2012-07-10 NOTE — OR Nursing (Signed)
Scope placed in rectal vault at 1247. Out at 1249. Thick mucus noted.

## 2012-07-10 NOTE — Op Note (Signed)
COLONOSCOPY PROCEDURE REPORT  PATIENT:  Walter Orozco  MR#:  [id number] Birthdate:  [birth year], 67 y.o., male Endoscopist:  Dr. Malissa Hippo, MD Referred By:  Dr. Bonnetta Barry ref. provider found Procedure Date: 07/10/2012  Procedure:   Colonoscopy via colostomy and examination along with examination of the rectum.  Indications: Patient is 67 year old Caucasian male with history of adenocarcinoma of colon. He is undergoing surveillance colonoscopy. His last exam was in November 2007.  Informed Consent:  The procedure and risks were reviewed with the patient and informed consent was obtained.  Medications:  Demerol 50 mg IV Versed 8 mg IV  Description of procedure:   Procedure was performed in 2 parts. For the first part patient was maintained in supine position and digital examination of colostomy was performed and was normal. Entex video colonoscope was placed via colostomy into the descending colon advanced proximally. It was easily passed into cecum which is identified by appendiceal orifice and ileocecal valve. As the scope was withdrawn findings were recorded and therapy rendered as below. For the second part patient was turned into left lateral recumbent position and rectal examination performed. No abnormality noted on digital exam. Colonoscope was placed in the rectum where large piece of inspissated mucus was noted. Rectal mucosa was unremarkable. I did not attempt to retroflex the scope.  Findings:   Few scattered diverticula throughout the colon. Small polyp ablated via cold biopsy from ascending colon and another 6 mmpolyp snared from ascending colon. Both of these polyps were submitted together. 2 small polyps are coagulated at transverse colon. Normal rectal mucosa.  Therapeutic/Diagnostic Maneuvers Performed:  See above  Complications:  None  Cecal Withdrawal Time:  19+2 minutes  Impression:  Two small polyps at the ascending colon submitted together. One was ablated via cold  biopsy and the other one was snared. Two small polyps at transverse colon were coagulated. Few scattered diverticula throughout the colon. Normal rectal mucosa.  Recommendations:  Standard instructions given. I will contact patient with biopsy results and further recommendations.  Walter Orozco  07/10/2012 12:55 PM  CC: Dr. Isabella Stalling, MD & Dr. Bonnetta Barry ref. provider found

## 2012-07-10 NOTE — H&P (Signed)
Walter Orozco is an 67 y.o. male.   Chief Complaint: Patient is here for colonoscopy. HPI: Patient is 67 year old Caucasian male with surgery for colon carcinoma back in January 2007. He had resection of sigmoid colon along with portion of his bladder and abdominal wall(en bloc resection). It  was T4, N1, M0 lesion. He was treated with chemoradiation postop. He has done well. He has surveillance colonoscopy in examination or rectal stump by me in November 2007. He was supposed to return for followup exam in 3 years. He denies abdominal pain bleeding or tarry stools. He also denies rectal discharge or bleeding. He has good appetite and his weight has been stable. History is significant for colon carcinoma in his father was in his 2s and lived to be 38.   Past Medical History  Diagnosis Date  . Hypertension   . High cholesterol   . Adenocarcinoma of sigmoid colon 06/11/2011    Past Surgical History  Procedure Date  . Colon surgery     Family History  Problem Relation Age of Onset  . Cancer Father    Social History:  reports that he has quit smoking. He has quit using smokeless tobacco. He reports that he drinks alcohol. He reports that he does not use illicit drugs.  Allergies: No Known Allergies  Medications Prior to Admission  Medication Sig Dispense Refill  . amLODipine (NORVASC) 5 MG tablet Take 5 mg by mouth daily.        . peg 3350 powder (MOVIPREP) 100 G SOLR Take 1 kit (100 g total) by mouth once.  1 kit  0  . pravastatin (PRAVACHOL) 40 MG tablet Take 40 mg by mouth daily.        No results found for this or any previous visit (from the past 48 hour(s)). No results found.  ROS  Blood pressure 131/85, pulse 80, temperature 98.2 F (36.8 C), temperature source Oral, resp. rate 22, height 5\' 6"  (1.676 m), weight 167 lb (75.751 kg), SpO2 95.00%. Physical Exam  Constitutional: He appears well-developed and well-nourished.  HENT:  Mouth/Throat: Oropharynx is clear and  moist.  Eyes: Conjunctivae normal are normal. No scleral icterus.  Neck: No thyromegaly present.  Cardiovascular: Normal rate, regular rhythm and normal heart sounds.   No murmur heard. Respiratory: Effort normal and breath sounds normal.  GI: Soft. He exhibits no distension and no mass. There is no tenderness.       Colostomy at [facility]  Musculoskeletal: He exhibits no edema.  Lymphadenopathy:    He has no cervical adenopathy.  Neurological: He is alert.  Skin: Skin is warm and dry.     Assessment/Plan History of adenocarcinoma of colon. Colonoscopy via colostomy and examination a rectosigmoid segment.  Walter Orozco U 07/10/2012, 12:12 PM

## 2012-07-14 ENCOUNTER — Encounter (INDEPENDENT_AMBULATORY_CARE_PROVIDER_SITE_OTHER): Payer: Self-pay | Admitting: *Deleted

## 2012-07-14 ENCOUNTER — Encounter (HOSPITAL_COMMUNITY): Payer: Self-pay | Admitting: Internal Medicine

## 2012-11-24 ENCOUNTER — Encounter (HOSPITAL_COMMUNITY): Payer: Medicare Other | Attending: Oncology

## 2012-11-24 DIAGNOSIS — C187 Malignant neoplasm of sigmoid colon: Secondary | ICD-10-CM

## 2012-11-24 DIAGNOSIS — Z85038 Personal history of other malignant neoplasm of large intestine: Secondary | ICD-10-CM

## 2012-11-24 DIAGNOSIS — Z09 Encounter for follow-up examination after completed treatment for conditions other than malignant neoplasm: Secondary | ICD-10-CM | POA: Insufficient documentation

## 2012-11-24 LAB — COMPREHENSIVE METABOLIC PANEL
AST: 19 U/L (ref 0–37)
Albumin: 4.2 g/dL (ref 3.5–5.2)
Alkaline Phosphatase: 92 U/L (ref 39–117)
Chloride: 103 mEq/L (ref 96–112)
Potassium: 4.1 mEq/L (ref 3.5–5.1)
Total Bilirubin: 0.5 mg/dL (ref 0.3–1.2)

## 2012-11-24 LAB — CBC WITH DIFFERENTIAL/PLATELET
Basophils Absolute: 0 10*3/uL (ref 0.0–0.1)
Lymphocytes Relative: 27 % (ref 12–46)
Neutro Abs: 4.1 10*3/uL (ref 1.7–7.7)
Neutrophils Relative %: 64 % (ref 43–77)
Platelets: 188 10*3/uL (ref 150–400)
RDW: 13.2 % (ref 11.5–15.5)
WBC: 6.4 10*3/uL (ref 4.0–10.5)

## 2012-11-24 LAB — CEA: CEA: 0.7 ng/mL (ref 0.0–5.0)

## 2012-11-24 NOTE — Progress Notes (Signed)
Labs drawn today for cbc/diff,cmp,cea 

## 2012-11-25 ENCOUNTER — Other Ambulatory Visit (HOSPITAL_COMMUNITY): Payer: PRIVATE HEALTH INSURANCE

## 2012-11-25 ENCOUNTER — Encounter (HOSPITAL_COMMUNITY): Payer: Self-pay | Admitting: Oncology

## 2012-11-25 ENCOUNTER — Encounter (HOSPITAL_BASED_OUTPATIENT_CLINIC_OR_DEPARTMENT_OTHER): Payer: Medicare Other | Admitting: Oncology

## 2012-11-25 VITALS — BP 154/76 | HR 74 | Temp 97.9°F | Resp 16 | Wt 176.2 lb

## 2012-11-25 DIAGNOSIS — C187 Malignant neoplasm of sigmoid colon: Secondary | ICD-10-CM

## 2012-11-25 NOTE — Progress Notes (Signed)
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 Malvin Johns and Dr. Jerre Simon on 07/25/2005 followed by combination chemotherapy and radiation therapy followed by oxaliplatinum-based chemotherapy in addition to bevacizumab. Thus far he remains disease free. CAT scans are too expensive for him to afford and he has not had one for 2 years. His last CAT scan was on 06/29/2010 of the chest abdomen and pelvis which showed no evidence for recurrent disease.  Oncology review of systems is negative. He is doing well. He is working intermittently. He is now 67 years old. He has no complaints on oncology review of systems. His colostomy remains functioning quite well.  BP 154/76  Pulse 74  Temp(Src) 97.9 F (36.6 C) (Oral)  Resp 16  Wt 176 lb 3.2 oz (79.924 kg)  BMI 28.45 kg/m2  He is in no acute distress. He has no obvious thyromegaly. He has no obvious skin lesions. His lungs are clear to auscultation and percussion. His heart shows a regular rhythm and rate without murmur rub or gallop. Abdomen remains soft and nontender without hepatosplenomegaly or masses. Bowel sounds are normal. His colostomy is intact. He has no peripheral edema. He is alert and oriented.  His weight is up 4 pounds since December 2013. He states his appetite is superb.  His lab work is excellent. Clinically he has no evidence recurrent disease. He will come back in a year.

## 2012-11-25 NOTE — Patient Instructions (Addendum)
[facility] Discharge Instructions  RECOMMENDATIONS MADE BY THE CONSULTANT AND ANY TEST RESULTS WILL BE SENT TO YOUR REFERRING PHYSICIAN.  EXAM FINDINGS BY THE PHYSICIAN TODAY AND SIGNS OR SYMPTOMS TO REPORT TO CLINIC OR PRIMARY PHYSICIAN: Exam and discussion by MD.  Your blood work is normal and your exam was normal.  No evidence of recurrence by exam.  MEDICATIONS PRESCRIBED:  none  INSTRUCTIONS GIVEN AND DISCUSSED: Report changes in bowel habits, blood in your stool or other problems.  SPECIAL INSTRUCTIONS/FOLLOW-UP: Blood work and follow-up appointment in 1 year.  Thank you for choosing [facility] to provide your oncology and hematology care.  To afford each patient quality time with our providers, please arrive at least 15 minutes before your scheduled appointment time.  With your help, our goal is to use those 15 minutes to complete the necessary work-up to ensure our physicians have the information they need to help with your evaluation and healthcare recommendations.    Effective January 1st, 2014, we ask that you re-schedule your appointment with our physicians should you arrive 10 or more minutes late for your appointment.  We strive to give you quality time with our providers, and arriving late affects you and other patients whose appointments are after yours.    Again, thank you for choosing [facility].  Our hope is that these requests will decrease the amount of time that you wait before being seen by our physicians.       _____________________________________________________________  Should you have questions after your visit to [facility], please contact our office at [phone] between the hours of 8:30 a.m. and 5:00 p.m.  Voicemails left after 4:30 p.m. will not be returned until the following business day.  For prescription refill requests, have your pharmacy contact our office with your prescription refill  request.

## 2012-11-26 ENCOUNTER — Ambulatory Visit (HOSPITAL_COMMUNITY): Payer: PRIVATE HEALTH INSURANCE | Admitting: Oncology

## 2012-11-26 NOTE — Progress Notes (Signed)
Patient up-to-date on surveillance colonoscopies Last exam was in January 2014

## 2013-01-28 ENCOUNTER — Other Ambulatory Visit: Payer: Self-pay

## 2013-04-30 ENCOUNTER — Other Ambulatory Visit: Payer: Self-pay

## 2013-11-24 ENCOUNTER — Encounter (HOSPITAL_COMMUNITY): Payer: Medicare Other | Attending: Hematology and Oncology

## 2013-11-24 DIAGNOSIS — Z87891 Personal history of nicotine dependence: Secondary | ICD-10-CM | POA: Insufficient documentation

## 2013-11-24 DIAGNOSIS — E7800 Pure hypercholesterolemia, unspecified: Secondary | ICD-10-CM | POA: Insufficient documentation

## 2013-11-24 DIAGNOSIS — M7989 Other specified soft tissue disorders: Secondary | ICD-10-CM | POA: Insufficient documentation

## 2013-11-24 DIAGNOSIS — Z79899 Other long term (current) drug therapy: Secondary | ICD-10-CM | POA: Insufficient documentation

## 2013-11-24 DIAGNOSIS — Z923 Personal history of irradiation: Secondary | ICD-10-CM | POA: Insufficient documentation

## 2013-11-24 DIAGNOSIS — G5790 Unspecified mononeuropathy of unspecified lower limb: Secondary | ICD-10-CM | POA: Insufficient documentation

## 2013-11-24 DIAGNOSIS — I10 Essential (primary) hypertension: Secondary | ICD-10-CM | POA: Insufficient documentation

## 2013-11-24 DIAGNOSIS — C189 Malignant neoplasm of colon, unspecified: Secondary | ICD-10-CM

## 2013-11-24 DIAGNOSIS — Z9889 Other specified postprocedural states: Secondary | ICD-10-CM | POA: Insufficient documentation

## 2013-11-24 DIAGNOSIS — Z9221 Personal history of antineoplastic chemotherapy: Secondary | ICD-10-CM | POA: Insufficient documentation

## 2013-11-24 DIAGNOSIS — C187 Malignant neoplasm of sigmoid colon: Secondary | ICD-10-CM

## 2013-11-24 LAB — CBC WITH DIFFERENTIAL/PLATELET
Basophils Absolute: 0 10*3/uL (ref 0.0–0.1)
Basophils Relative: 0 % (ref 0–1)
Eosinophils Absolute: 0.2 10*3/uL (ref 0.0–0.7)
Eosinophils Relative: 3 % (ref 0–5)
HCT: 43.4 % (ref 39.0–52.0)
Hemoglobin: 15.1 g/dL (ref 13.0–17.0)
Lymphocytes Relative: 31 % (ref 12–46)
Lymphs Abs: 1.6 10*3/uL (ref 0.7–4.0)
MCH: 31.2 pg (ref 26.0–34.0)
MCHC: 34.8 g/dL (ref 30.0–36.0)
MCV: 89.7 fL (ref 78.0–100.0)
Monocytes Absolute: 0.6 10*3/uL (ref 0.1–1.0)
Monocytes Relative: 12 % (ref 3–12)
Neutro Abs: 2.8 10*3/uL (ref 1.7–7.7)
Neutrophils Relative %: 54 % (ref 43–77)
Platelets: 176 10*3/uL (ref 150–400)
RBC: 4.84 MIL/uL (ref 4.22–5.81)
RDW: 13 % (ref 11.5–15.5)
WBC: 5.2 10*3/uL (ref 4.0–10.5)

## 2013-11-24 LAB — COMPREHENSIVE METABOLIC PANEL WITH GFR
ALT: 24 U/L (ref 0–53)
AST: 21 U/L (ref 0–37)
Albumin: 4 g/dL (ref 3.5–5.2)
Alkaline Phosphatase: 89 U/L (ref 39–117)
BUN: 11 mg/dL (ref 6–23)
CO2: 28 meq/L (ref 19–32)
Calcium: 9.5 mg/dL (ref 8.4–10.5)
Chloride: 104 meq/L (ref 96–112)
Creatinine, Ser: 0.8 mg/dL (ref 0.50–1.35)
GFR calc Af Amer: >90 mL/min
GFR calc non Af Amer: >90 mL/min
Glucose, Bld: 104 mg/dL — ABNORMAL HIGH (ref 70–99)
Potassium: 4.8 meq/L (ref 3.7–5.3)
Sodium: 142 meq/L (ref 137–147)
Total Bilirubin: 0.4 mg/dL (ref 0.3–1.2)
Total Protein: 7.1 g/dL (ref 6.0–8.3)

## 2013-11-24 LAB — FERRITIN: Ferritin: 130 ng/mL (ref 22–322)

## 2013-11-24 NOTE — Progress Notes (Signed)
Labs drawn

## 2013-11-25 ENCOUNTER — Other Ambulatory Visit (HOSPITAL_COMMUNITY): Payer: BC Managed Care – PPO

## 2013-11-25 LAB — CEA: CEA: 1 ng/mL (ref 0.0–5.0)

## 2013-11-27 ENCOUNTER — Encounter (HOSPITAL_COMMUNITY): Payer: Self-pay

## 2013-11-27 ENCOUNTER — Encounter (HOSPITAL_BASED_OUTPATIENT_CLINIC_OR_DEPARTMENT_OTHER): Payer: Medicare Other

## 2013-11-27 VITALS — BP 127/79 | HR 78 | Temp 97.9°F | Resp 20 | Wt 172.0 lb

## 2013-11-27 DIAGNOSIS — Z85038 Personal history of other malignant neoplasm of large intestine: Secondary | ICD-10-CM

## 2013-11-27 DIAGNOSIS — G589 Mononeuropathy, unspecified: Secondary | ICD-10-CM

## 2013-11-27 DIAGNOSIS — C189 Malignant neoplasm of colon, unspecified: Secondary | ICD-10-CM

## 2013-11-27 NOTE — Patient Instructions (Signed)
[facility] Discharge Instructions  RECOMMENDATIONS MADE BY THE CONSULTANT AND ANY TEST RESULTS WILL BE SENT TO YOUR REFERRING PHYSICIAN.  Return for blood work and office visit in one year.  Thank you for choosing [facility] to provide your oncology and hematology care.  To afford each patient quality time with our providers, please arrive at least 15 minutes before your scheduled appointment time.  With your help, our goal is to use those 15 minutes to complete the necessary work-up to ensure our physicians have the information they need to help with your evaluation and healthcare recommendations.    Effective January 1st, 2014, we ask that you re-schedule your appointment with our physicians should you arrive 10 or more minutes late for your appointment.  We strive to give you quality time with our providers, and arriving late affects you and other patients whose appointments are after yours.    Again, thank you for choosing [facility].  Our hope is that these requests will decrease the amount of time that you wait before being seen by our physicians.       _____________________________________________________________  Should you have questions after your visit to [facility], please contact our office at [phone] between the hours of 8:30 a.m. and 5:00 p.m.  Voicemails left after 4:30 p.m. will not be returned until the following business day.  For prescription refill requests, have your pharmacy contact our office with your prescription refill request.

## 2013-11-27 NOTE — Progress Notes (Signed)
[facility]  OFFICE PROGRESS NOTE  Walter Curet, MD [place] [address]  DIAGNOSIS: Colon cancer - Plan: CBC with Differential, Comprehensive metabolic panel, CEA, Ferritin, CBC with Differential, Comprehensive metabolic panel, CEA  No chief complaint on file.   CURRENT THERAPY: Watchful expectation and surveillance. F2 followup of stage III adenocarcinoma of the sigmoid colon with invasion into the bladder, status post resection followed by chemotherapy and radiation treatment utilizing oxaliplatin-based chemotherapy in addition to bevacizumab. He seen on yearly basis for followup. CT followup would result in excessive financial burden.   INTERVAL HISTORY: Walter Orozco 68 y.o. male returns  followup of stage III adenocarcinoma of the sigmoid colon with invasion into the bladder, status post resection followed by chemotherapy and radiation treatment utilizing oxaliplatin-based chemotherapy in addition to bevacizumab. He seen on yearly basis for followup. CT followup would result in excessive financial burden.  He had developed lower extremity swelling which was reversed by low-dose diuretic in conjunction with lisinopril. He denies any sore throat, cough, wheezing, PND, orthopnea, palpitations, diarrhea, constipation, diarrhea, melena, hematuria, epistaxis, hemoptysis, but still with residual lower extremity paresthesias. Upper extremity numbness has dissipated completely.  MEDICAL HISTORY: Past Medical History  Diagnosis Date  . Hypertension   . High cholesterol   . Adenocarcinoma of sigmoid colon 06/11/2011    INTERIM HISTORY: has Adenocarcinoma of sigmoid colon on his problem list.   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 Romona Curls and Dr. Michela Pitcher on 07/25/2005 followed by combination chemotherapy and radiation  therapy followed by oxaliplatinum-based chemotherapy in addition to bevacizumab.  ALLERGIES:  has No Known Allergies.  MEDICATIONS: has a current medication list which includes the following prescription(s): amlodipine, lisinopril, simvastatin, and ibuprofen.  SURGICAL HISTORY:  Past Surgical History  Procedure Laterality Date  . Colon surgery    . Colonoscopy  07/10/2012    Procedure: COLONOSCOPY;  Surgeon: Rogene Houston, MD;  Location: [facility];  Service: Endoscopy;  Laterality: N/A;  1200    FAMILY HISTORY: family history includes Cancer in his father.  SOCIAL HISTORY:  reports that he has quit smoking. He has quit using smokeless tobacco. He reports that he drinks alcohol. He reports that he does not use illicit drugs.  REVIEW OF SYSTEMS:  Other than that discussed above is noncontributory.  PHYSICAL EXAMINATION: ECOG PERFORMANCE STATUS: 1 - Symptomatic but completely ambulatory  Blood pressure 127/79, pulse 78, temperature 97.9 F (36.6 C), temperature source Oral, resp. rate 20, weight 172 lb (78.019 kg).  GENERAL:alert, no distress and comfortable SKIN: skin color, texture, turgor are normal, no rashes or significant lesions EYES: PERLA; Conjunctiva are pink and non-injected, sclera clear SINUSES: No redness or tenderness over maxillary or ethmoid sinuses OROPHARYNX:no exudate, no erythema on lips, buccal mucosa, or tongue. NECK: supple, thyroid normal size, non-tender, without nodularity. No masses CHEST: Normal AP diameter with no breast masses. LYMPH:  no palpable lymphadenopathy in the cervical, axillary or inguinal LUNGS: clear to auscultation and percussion with normal breathing effort HEART: regular rate & rhythm and no murmurs. ABDOMEN:abdomen soft, non-tender and normal bowel sounds MUSCULOSKELETAL:no cyanosis of digits and no clubbing. Range of motion normal.  NEURO: alert & oriented x 3 with fluent speech, no focal motor/sensory deficits   LABORATORY  DATA: Infusion on 11/24/2013  Component Date Value Ref Range Status  .  WBC 11/24/2013 5.2  4.0 - 10.5 K/uL Final  . RBC 11/24/2013 4.84  4.22 - 5.81 MIL/uL Final  . Hemoglobin 11/24/2013 15.1  13.0 - 17.0 g/dL Final  . HCT 11/24/2013 43.4  39.0 - 52.0 % Final  . MCV 11/24/2013 89.7  78.0 - 100.0 fL Final  . MCH 11/24/2013 31.2  26.0 - 34.0 pg Final  . MCHC 11/24/2013 34.8  30.0 - 36.0 g/dL Final  . RDW 11/24/2013 13.0  11.5 - 15.5 % Final  . Platelets 11/24/2013 176  150 - 400 K/uL Final  . Neutrophils Relative % 11/24/2013 54  43 - 77 % Final  . Neutro Abs 11/24/2013 2.8  1.7 - 7.7 K/uL Final  . Lymphocytes Relative 11/24/2013 31  12 - 46 % Final  . Lymphs Abs 11/24/2013 1.6  0.7 - 4.0 K/uL Final  . Monocytes Relative 11/24/2013 12  3 - 12 % Final  . Monocytes Absolute 11/24/2013 0.6  0.1 - 1.0 K/uL Final  . Eosinophils Relative 11/24/2013 3  0 - 5 % Final  . Eosinophils Absolute 11/24/2013 0.2  0.0 - 0.7 K/uL Final  . Basophils Relative 11/24/2013 0  0 - 1 % Final  . Basophils Absolute 11/24/2013 0.0  0.0 - 0.1 K/uL Final  . Sodium 11/24/2013 142  137 - 147 mEq/L Final  . Potassium 11/24/2013 4.8  3.7 - 5.3 mEq/L Final  . Chloride 11/24/2013 104  96 - 112 mEq/L Final  . CO2 11/24/2013 28  19 - 32 mEq/L Final  . Glucose, Bld 11/24/2013 104* 70 - 99 mg/dL Final  . BUN 11/24/2013 11  6 - 23 mg/dL Final  . Creatinine, Ser 11/24/2013 0.80  0.50 - 1.35 mg/dL Final  . Calcium 11/24/2013 9.5  8.4 - 10.5 mg/dL Final  . Total Protein 11/24/2013 7.1  6.0 - 8.3 g/dL Final  . Albumin 11/24/2013 4.0  3.5 - 5.2 g/dL Final  . AST 11/24/2013 21  0 - 37 U/L Final  . ALT 11/24/2013 24  0 - 53 U/L Final  . Alkaline Phosphatase 11/24/2013 89  39 - 117 U/L Final  . Total Bilirubin 11/24/2013 0.4  0.3 - 1.2 mg/dL Final  . GFR calc non Af Amer 11/24/2013 >90  >90 mL/min Final  . GFR calc Af Amer 11/24/2013 >90  >90 mL/min Final   Comment: (NOTE)                          The eGFR has been calculated  using the CKD EPI equation.                          This calculation has not been validated in all clinical situations.                          eGFR's persistently <90 mL/min signify possible Chronic Kidney                          Disease.  . CEA 11/24/2013 1.0  0.0 - 5.0 ng/mL Final   Performed at [facility]  . Ferritin 11/24/2013 130  22 - 322 ng/mL Final   Performed at [facility]: No new pathology.  Urinalysis No results found for this basename: colorurine,  appearanceur,  labspec,  phurine,  glucoseu,  hgbur,  bilirubinur,  ketonesur,  proteinur,  urobilinogen,  nitrite,  leukocytesur    RADIOGRAPHIC STUDIES: No results found.  ASSESSMENT:  #1. Stage III (T4, N1, M0) adenocarcinoma sigmoid colon with 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 Romona Curls and Dr. Michela Pitcher on 07/25/2005 followed by combination chemotherapy and radiation therapy followed by oxaliplatinum-based chemotherapy in addition to bevacizumab, no evidence of disease. #2. Residual lower  extremity neuropathy, slowly improving. #3. Hypertension, controlled.    PLAN:  #1. Continue watchful expectation. #2. Followup in one year with CBC, chem profile, and CEA.   All questions were answered. The patient knows to call the clinic with any problems, questions or concerns. We can certainly see the patient much sooner if necessary.   I spent 25 minutes counseling the patient face to face. The total time spent in the appointment was 30 minutes.    Farrel Gobble, MD 11/27/2013 9:48 AM  DISCLAIMER:  This note was dictated with voice recognition software.  Similar sounding words can inadvertently be transcribed inaccurately and may not be corrected upon review.

## 2014-10-29 ENCOUNTER — Ambulatory Visit (INDEPENDENT_AMBULATORY_CARE_PROVIDER_SITE_OTHER): Payer: Medicare Other | Admitting: Urology

## 2014-10-29 DIAGNOSIS — N400 Enlarged prostate without lower urinary tract symptoms: Secondary | ICD-10-CM

## 2014-10-29 DIAGNOSIS — R972 Elevated prostate specific antigen [PSA]: Secondary | ICD-10-CM

## 2014-11-26 ENCOUNTER — Other Ambulatory Visit (HOSPITAL_COMMUNITY): Payer: BC Managed Care – PPO

## 2014-11-26 ENCOUNTER — Ambulatory Visit (HOSPITAL_COMMUNITY): Payer: Medicare Other | Admitting: Oncology

## 2014-12-09 ENCOUNTER — Other Ambulatory Visit (HOSPITAL_COMMUNITY): Payer: Self-pay

## 2014-12-09 DIAGNOSIS — C187 Malignant neoplasm of sigmoid colon: Secondary | ICD-10-CM

## 2014-12-15 ENCOUNTER — Ambulatory Visit (HOSPITAL_COMMUNITY): Payer: Medicare Other | Admitting: Oncology

## 2014-12-16 ENCOUNTER — Encounter (HOSPITAL_COMMUNITY): Payer: Medicare Other | Attending: Hematology & Oncology | Admitting: Oncology

## 2014-12-16 ENCOUNTER — Encounter (HOSPITAL_COMMUNITY): Payer: Medicare Other

## 2014-12-16 ENCOUNTER — Encounter (HOSPITAL_COMMUNITY): Payer: Self-pay | Admitting: Oncology

## 2014-12-16 VITALS — BP 129/73 | HR 88 | Temp 97.7°F | Resp 22 | Wt 180.1 lb

## 2014-12-16 DIAGNOSIS — C187 Malignant neoplasm of sigmoid colon: Secondary | ICD-10-CM | POA: Diagnosis not present

## 2014-12-16 LAB — COMPREHENSIVE METABOLIC PANEL
ALT: 28 U/L (ref 17–63)
ANION GAP: 11 (ref 5–15)
AST: 25 U/L (ref 15–41)
Albumin: 4.6 g/dL (ref 3.5–5.0)
Alkaline Phosphatase: 80 U/L (ref 38–126)
BUN: 13 mg/dL (ref 6–20)
CALCIUM: 9.4 mg/dL (ref 8.9–10.3)
CO2: 26 mmol/L (ref 22–32)
CREATININE: 0.83 mg/dL (ref 0.61–1.24)
Chloride: 103 mmol/L (ref 101–111)
GFR calc Af Amer: >60 mL/min (ref >60)
GFR calc non Af Amer: >60 mL/min (ref >60)
GLUCOSE: 83 mg/dL (ref 65–99)
Potassium: 3.6 mmol/L (ref 3.5–5.1)
Sodium: 140 mmol/L (ref 135–145)
Total Bilirubin: 0.8 mg/dL (ref 0.3–1.2)
Total Protein: 7.7 g/dL (ref 6.5–8.1)

## 2014-12-16 LAB — CBC WITH DIFFERENTIAL/PLATELET
BASOS PCT: 0 % (ref 0–1)
Basophils Absolute: 0 10*3/uL (ref 0.0–0.1)
Eosinophils Absolute: 0.1 10*3/uL (ref 0.0–0.7)
Eosinophils Relative: 2 % (ref 0–5)
HEMATOCRIT: 45.1 % (ref 39.0–52.0)
Hemoglobin: 15.8 g/dL (ref 13.0–17.0)
Lymphocytes Relative: 33 % (ref 12–46)
Lymphs Abs: 2.4 10*3/uL (ref 0.7–4.0)
MCH: 31.4 pg (ref 26.0–34.0)
MCHC: 35 g/dL (ref 30.0–36.0)
MCV: 89.7 fL (ref 78.0–100.0)
MONO ABS: 0.7 10*3/uL (ref 0.1–1.0)
Monocytes Relative: 10 % (ref 3–12)
NEUTROS ABS: 4.1 10*3/uL (ref 1.7–7.7)
Neutrophils Relative %: 55 % (ref 43–77)
Platelets: 170 10*3/uL (ref 150–400)
RBC: 5.03 MIL/uL (ref 4.22–5.81)
RDW: 13 % (ref 11.5–15.5)
WBC: 7.3 10*3/uL (ref 4.0–10.5)

## 2014-12-16 NOTE — Patient Instructions (Signed)
..  [place] at [facility] Discharge Instructions  RECOMMENDATIONS MADE BY THE CONSULTANT AND ANY TEST RESULTS WILL BE SENT TO YOUR REFERRING PHYSICIAN.  Labs done today  Labs in 1 year  Return in 1 year for follow up  Thank you for choosing [place] at [facility] to provide your oncology and hematology care.  To afford each patient quality time with our provider, please arrive at least 15 minutes before your scheduled appointment time.    You need to re-schedule your appointment should you arrive 10 or more minutes late.  We strive to give you quality time with our providers, and arriving late affects you and other patients whose appointments are after yours.  Also, if you no show three or more times for appointments you may be dismissed from the clinic at the providers discretion.     Again, thank you for choosing [facility].  Our hope is that these requests will decrease the amount of time that you wait before being seen by our physicians.       _____________________________________________________________  Should you have questions after your visit to [facility], please contact our office at [phone] between the hours of 8:30 a.m. and 4:30 p.m.  Voicemails left after 4:30 p.m. will not be returned until the following business day.  For prescription refill requests, have your pharmacy contact our office.

## 2014-12-16 NOTE — Assessment & Plan Note (Signed)
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 Romona Curls and Dr. Michela Pitcher on 07/25/2005 followed by combination chemotherapy and radiation therapy followed by oxaliplatinum-based chemotherapy in addition to bevacizumab. Thus far he remains disease free. CAT scans are too expensive for him to afford and he has not had one for 2 years. His last CAT scan was on 06/29/2010 of the chest abdomen and pelvis which showed no evidence for recurrent disease.  Labs today: CBC diff, CMET, CEA  Return in 1 year for follow-up.  We would be glad to see him sooner if necessary.  Next year marks his 10 year mark from definitive surgery and consideration for release from the [facility] may be considered.

## 2014-12-16 NOTE — Progress Notes (Signed)
Walter Orozco, [place] [address]  Adenocarcinoma of sigmoid colon - Plan: CBC with Differential, Comprehensive metabolic panel, CEA  CURRENT THERAPY: Surveillance per NCCN guidelines.  INTERVAL HISTORY: Walter Orozco 69 y.o. 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 Romona Curls and Dr. Michela Pitcher on 07/25/2005 followed by combination chemotherapy and radiation therapy followed by oxaliplatinum-based chemotherapy in addition to bevacizumab. Thus far he remains disease free. CAT scans are too expensive for him to afford and he has not had one for 2 years. His last CAT scan was on 06/29/2010 of the chest abdomen and pelvis which showed no evidence for recurrent disease.  I personally reviewed and went over laboratory results with the patient.  The results are noted within this dictation.  He is doing great!  He works on his landscaping daily for about 2 hours.  He is golfing every Wednesday and the other day he shot a 74.  "I feel great."  He denies any blood in stool, dark stool, change in stool color, caliber, and frequency.  His appetite is strong.  He denies any weight loss.  Past Medical History  Diagnosis Date  . Hypertension   . High cholesterol   . Adenocarcinoma of sigmoid colon 06/11/2011    has Adenocarcinoma of sigmoid colon on his problem list.     has No Known Allergies.  Current Outpatient Prescriptions on File Prior to Visit  Medication Sig Dispense Refill  . amLODipine (NORVASC) 10 MG tablet Take 10 mg by mouth daily.    Marland Kitchen ibuprofen (ADVIL) 200 MG tablet Take 200 mg by mouth every 6 (six) hours as needed for pain.    Marland Kitchen lisinopril (PRINIVIL,ZESTRIL) 10 MG tablet Take 10 mg by mouth daily.    . simvastatin (ZOCOR) 40 MG tablet Take 40 mg by mouth daily.     No current facility-administered  medications on file prior to visit.    Past Surgical History  Procedure Laterality Date  . Colon surgery    . Colonoscopy  07/10/2012    Procedure: COLONOSCOPY;  Surgeon: Rogene Houston, MD;  Location: [facility];  Service: Endoscopy;  Laterality: N/A;  1200    Denies any headaches, dizziness, double vision, fevers, chills, night sweats, nausea, vomiting, diarrhea, constipation, chest pain, heart palpitations, shortness of breath, blood in stool, black tarry stool, urinary pain, urinary burning, urinary frequency, hematuria.   PHYSICAL EXAMINATION  ECOG PERFORMANCE STATUS: 0 - Asymptomatic  Filed Vitals:   12/16/14 1352  BP: 129/73  Pulse: 88  Temp: 97.7 F (36.5 C)  Resp: 22    GENERAL:alert, healthy, no distress, well nourished, well developed, comfortable, cooperative and smiling SKIN: skin color, texture, turgor are normal, no rashes or significant lesions HEAD: Normocephalic, No masses, lesions, tenderness or abnormalities EYES: normal, PERRLA, EOMI, Conjunctiva are pink and non-injected EARS: External ears normal OROPHARYNX:lips, buccal mucosa, and tongue normal and mucous membranes are moist  NECK: supple, no adenopathy, thyroid normal size, non-tender, without nodularity, no stridor, non-tender, trachea midline LYMPH:  no palpable lymphadenopathy, no hepatosplenomegaly BREAST:not examined LUNGS: clear to auscultation and percussion HEART: regular rate & rhythm, no murmurs, no gallops, S1 normal and S2 normal ABDOMEN:abdomen soft, non-tender, obese, normal bowel sounds and no masses or organomegaly BACK: Back symmetric, no curvature., No CVA tenderness EXTREMITIES:less then 2 second  capillary refill, no joint deformities, effusion, or inflammation, no edema, no skin discoloration, no clubbing, no cyanosis  NEURO: alert & oriented x 3 with fluent speech, no focal motor/sensory deficits, gait normal    LABORATORY DATA: CBC    Component Value Date/Time   WBC 7.3  12/16/2014 1415   RBC 5.03 12/16/2014 1415   HGB 15.8 12/16/2014 1415   HCT 45.1 12/16/2014 1415   PLT 170 12/16/2014 1415   MCV 89.7 12/16/2014 1415   MCH 31.4 12/16/2014 1415   MCHC 35.0 12/16/2014 1415   RDW 13.0 12/16/2014 1415   LYMPHSABS 2.4 12/16/2014 1415   MONOABS 0.7 12/16/2014 1415   EOSABS 0.1 12/16/2014 1415   BASOSABS 0.0 12/16/2014 1415      Chemistry      Component Value Date/Time   NA 140 12/16/2014 1415   K 3.6 12/16/2014 1415   CL 103 12/16/2014 1415   CO2 26 12/16/2014 1415   BUN 13 12/16/2014 1415   CREATININE 0.83 12/16/2014 1415      Component Value Date/Time   CALCIUM 9.4 12/16/2014 1415   ALKPHOS 80 12/16/2014 1415   AST 25 12/16/2014 1415   ALT 28 12/16/2014 1415   BILITOT 0.8 12/16/2014 1415        ASSESSMENT AND PLAN:  Adenocarcinoma of sigmoid colon Advanced stage III (T4, N1, M0) adenocarcinoma of the sigmoid colon with 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 Romona Curls and Dr. Michela Pitcher on 07/25/2005 followed by combination chemotherapy and radiation therapy followed by oxaliplatinum-based chemotherapy in addition to bevacizumab. Thus far he remains disease free. CAT scans are too expensive for him to afford and he has not had one for 2 years. His last CAT scan was on 06/29/2010 of the chest abdomen and pelvis which showed no evidence for recurrent disease.  Labs today: CBC diff, CMET, CEA  Return in 1 year for follow-up.  We would be glad to see him sooner if necessary.  Next year marks his 10 year mark from definitive surgery and consideration for release from the [facility] may be considered.   THERAPY PLAN:  Surveillance per NCCN guidelines is exhausted.  We will see him back next at which time we can consider release from the clinic as he will be 10 years out from definitive surgery. NCCN guidelines for surveillance for T3/T4, N0-2, M0 colorectal cancer are:   A. H+P every 3-6 months x 2 years and then  every 6 months for a total of 5 years   B. CEA every 3 months x 2 years and then every 6 months for a total of 5 years   C. CT abd/pelvis annually for up to 5 years   D. Colonoscopy in 1 year except if no preoperative colonoscopy due to obstructing lesion, colonoscopy in 3-6 months.    1. If advanced adenoma, repeat in 1 year   2. If no advanced adenoma, repeat in 3 years, then every 5 years  E. PET scan not routinely recommended.   All questions were answered. The patient knows to call the clinic with any problems, questions or concerns. We can certainly see the patient much sooner if necessary.  Patient and plan discussed with Dr. Ancil Linsey and she is in agreement with the aforementioned.   This note is electronically signed by: Doy Mince 12/16/2014 5:01 PM

## 2014-12-17 ENCOUNTER — Telehealth (HOSPITAL_COMMUNITY): Payer: Self-pay | Admitting: Emergency Medicine

## 2014-12-17 LAB — CEA: CEA: 1.3 ng/mL (ref 0.0–4.7)

## 2014-12-17 NOTE — Telephone Encounter (Signed)
-----   Message from [facility], PA-C sent at 12/17/2014  3:24 PM EDT ----- Excellent.  Let him know

## 2014-12-17 NOTE — Telephone Encounter (Signed)
Notified pt that blood work was good

## 2015-04-29 ENCOUNTER — Ambulatory Visit (INDEPENDENT_AMBULATORY_CARE_PROVIDER_SITE_OTHER): Payer: Medicare Other | Admitting: Urology

## 2015-04-29 DIAGNOSIS — R972 Elevated prostate specific antigen [PSA]: Secondary | ICD-10-CM

## 2015-04-29 DIAGNOSIS — N400 Enlarged prostate without lower urinary tract symptoms: Secondary | ICD-10-CM | POA: Diagnosis not present

## 2015-07-13 ENCOUNTER — Encounter (INDEPENDENT_AMBULATORY_CARE_PROVIDER_SITE_OTHER): Payer: Self-pay | Admitting: *Deleted

## 2015-10-20 ENCOUNTER — Other Ambulatory Visit (INDEPENDENT_AMBULATORY_CARE_PROVIDER_SITE_OTHER): Payer: Self-pay | Admitting: *Deleted

## 2015-10-20 ENCOUNTER — Encounter (INDEPENDENT_AMBULATORY_CARE_PROVIDER_SITE_OTHER): Payer: Self-pay | Admitting: *Deleted

## 2015-10-20 DIAGNOSIS — Z800 Family history of malignant neoplasm of digestive organs: Secondary | ICD-10-CM

## 2015-10-20 DIAGNOSIS — Z85038 Personal history of other malignant neoplasm of large intestine: Secondary | ICD-10-CM

## 2015-10-20 DIAGNOSIS — Z86010 Personal history of colonic polyps: Secondary | ICD-10-CM

## 2015-12-01 ENCOUNTER — Encounter (INDEPENDENT_AMBULATORY_CARE_PROVIDER_SITE_OTHER): Payer: Self-pay | Admitting: *Deleted

## 2015-12-19 ENCOUNTER — Ambulatory Visit (HOSPITAL_COMMUNITY): Payer: Medicare Other | Admitting: Hematology & Oncology

## 2015-12-19 ENCOUNTER — Ambulatory Visit (HOSPITAL_COMMUNITY): Payer: Medicare Other | Admitting: Oncology

## 2015-12-19 ENCOUNTER — Other Ambulatory Visit (HOSPITAL_COMMUNITY): Payer: Medicare Other

## 2016-01-17 ENCOUNTER — Encounter (HOSPITAL_COMMUNITY): Payer: Self-pay | Admitting: Oncology

## 2016-01-17 ENCOUNTER — Encounter (HOSPITAL_COMMUNITY): Payer: Medicare Other | Attending: Hematology & Oncology | Admitting: Oncology

## 2016-01-17 ENCOUNTER — Encounter (HOSPITAL_COMMUNITY): Payer: Medicare Other

## 2016-01-17 DIAGNOSIS — C187 Malignant neoplasm of sigmoid colon: Secondary | ICD-10-CM | POA: Diagnosis present

## 2016-01-17 DIAGNOSIS — Z809 Family history of malignant neoplasm, unspecified: Secondary | ICD-10-CM | POA: Insufficient documentation

## 2016-01-17 DIAGNOSIS — Z9889 Other specified postprocedural states: Secondary | ICD-10-CM | POA: Diagnosis not present

## 2016-01-17 DIAGNOSIS — Z87891 Personal history of nicotine dependence: Secondary | ICD-10-CM | POA: Insufficient documentation

## 2016-01-17 DIAGNOSIS — Z9221 Personal history of antineoplastic chemotherapy: Secondary | ICD-10-CM | POA: Diagnosis not present

## 2016-01-17 DIAGNOSIS — Z923 Personal history of irradiation: Secondary | ICD-10-CM | POA: Insufficient documentation

## 2016-01-17 LAB — COMPREHENSIVE METABOLIC PANEL
ALT: 32 U/L (ref 17–63)
ANION GAP: 6 (ref 5–15)
AST: 25 U/L (ref 15–41)
Albumin: 4.4 g/dL (ref 3.5–5.0)
Alkaline Phosphatase: 76 U/L (ref 38–126)
BUN: 14 mg/dL (ref 6–20)
CHLORIDE: 106 mmol/L (ref 101–111)
CO2: 27 mmol/L (ref 22–32)
Calcium: 9.3 mg/dL (ref 8.9–10.3)
Creatinine, Ser: 0.82 mg/dL (ref 0.61–1.24)
Glucose, Bld: 87 mg/dL (ref 65–99)
POTASSIUM: 4.1 mmol/L (ref 3.5–5.1)
SODIUM: 139 mmol/L (ref 135–145)
Total Bilirubin: 0.6 mg/dL (ref 0.3–1.2)
Total Protein: 7.2 g/dL (ref 6.5–8.1)

## 2016-01-17 LAB — CBC WITH DIFFERENTIAL/PLATELET
BASOS ABS: 0 10*3/uL (ref 0.0–0.1)
Basophils Relative: 0 %
EOS ABS: 0.1 10*3/uL (ref 0.0–0.7)
EOS PCT: 1 %
HCT: 43.9 % (ref 39.0–52.0)
Hemoglobin: 15.5 g/dL (ref 13.0–17.0)
LYMPHS PCT: 33 %
Lymphs Abs: 2.3 10*3/uL (ref 0.7–4.0)
MCH: 31.4 pg (ref 26.0–34.0)
MCHC: 35.3 g/dL (ref 30.0–36.0)
MCV: 88.9 fL (ref 78.0–100.0)
MONO ABS: 0.6 10*3/uL (ref 0.1–1.0)
Monocytes Relative: 9 %
Neutro Abs: 4.1 10*3/uL (ref 1.7–7.7)
Neutrophils Relative %: 57 %
PLATELETS: 155 10*3/uL (ref 150–400)
RBC: 4.94 MIL/uL (ref 4.22–5.81)
RDW: 12.9 % (ref 11.5–15.5)
WBC: 7.1 10*3/uL (ref 4.0–10.5)

## 2016-01-17 NOTE — Patient Instructions (Signed)
[place] at [facility] Discharge Instructions  RECOMMENDATIONS MADE BY THE CONSULTANT AND ANY TEST RESULTS WILL BE SENT TO YOUR REFERRING PHYSICIAN.  You saw Kirby Crigler, PA-C today Return in 12 months for follow up. You are being referred to survivorship program.  Thank you for choosing [place] at [facility] to provide your oncology and hematology care.  To afford each patient quality time with our provider, please arrive at least 15 minutes before your scheduled appointment time.   Beginning January 23rd 2017 lab work for the [facility] will be done in the  Main lab at [facility] on 1st floor. If you have a lab appointment with the [place] please come in thru the  Main Entrance and check in at the main information desk  You need to re-schedule your appointment should you arrive 10 or more minutes late.  We strive to give you quality time with our providers, and arriving late affects you and other patients whose appointments are after yours.  Also, if you no show three or more times for appointments you may be dismissed from the clinic at the providers discretion.     Again, thank you for choosing [facility].  Our hope is that these requests will decrease the amount of time that you wait before being seen by our physicians.       _____________________________________________________________  Should you have questions after your visit to [facility], please contact our office at [phone] between the hours of 8:30 a.m. and 4:30 p.m.  Voicemails left after 4:30 p.m. will not be returned until the following business day.  For prescription refill requests, have your pharmacy contact our office.         Resources For Cancer Patients and their Caregivers ? American Cancer Society: Can assist with transportation, wigs, general needs, runs Look Good Feel Better.        812 368 3640 ? Cancer  Care: Provides financial assistance, online support groups, medication/co-pay assistance.  1-800-813-HOPE 218-862-4982) ? [facility] Assists [place] cancer patients and their families through emotional , educational and financial support.  (254)212-0388 ? [place] DSS Where to apply for food stamps, Medicaid and utility assistance. 830-039-6302 ? [facility]: Transportation to medical appointments. 908-355-7101 ? Social Security Administration: May apply for disability if have a Stage IV cancer. 213-774-5883 5405241484 ? [employer], Disability and Transit Services: Assists with nutrition, care and transit needs. [place] Support Programs: @10RELATIVEDAYS @ > Cancer Support Group  2nd Tuesday of the month 1pm-2pm, Journey Room  > Creative Journey  3rd Tuesday of the month 1130am-1pm, Journey Room  > Look Good Feel Better  1st Wednesday of the month 10am-12 noon, Journey Room (Call [place] to register [phone])

## 2016-01-17 NOTE — Assessment & Plan Note (Addendum)
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 Romona Curls and Dr. Michela Pitcher on 07/25/2005 followed by combination chemotherapy and radiation therapy followed by oxaliplatinum-based chemotherapy in addition to bevacizumab. Thus far he remains disease free. CAT scans are too expensive for him to afford and he has not had one for 2 years. His last CAT scan was on 06/29/2010 of the chest abdomen and pelvis which showed no evidence for recurrent disease.  Labs today: CBC diff, CMET, CEA.  I personally reviewed and went over laboratory results with the patient.  The results are noted within this dictation.  Labs in 1 year: CBC diff, CMET, CEA.  Return in 1 year for follow-up.  We would be glad to see him sooner if necessary.  This year marks his 10 year anniversary from definitive surgery and adjuvant treatment.  Will refer him into survivorship program for ongoing survivorship with annual follow-up.

## 2016-01-17 NOTE — Progress Notes (Signed)
Maricela Curet, MD [place] [address]  Adenocarcinoma of sigmoid colon [facility])  CURRENT THERAPY: Surveillance per NCCN guidelines  INTERVAL HISTORY: Walter Orozco 70 y.o. 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 Romona Curls and Dr. Michela Pitcher on 07/25/2005 followed by combination chemotherapy and radiation therapy followed by oxaliplatinum-based chemotherapy in addition to bevacizumab. Thus far he remains disease free. CAT scans are too expensive for him to afford and he has not had one for 2 years. His last CAT scan was on 06/29/2010 of the chest abdomen and pelvis which showed no evidence for recurrent disease.  He is doing well and denies any complaints today. He continues to golf on a regular basis and is playing well.  Review of Systems  Constitutional: Negative.  Negative for chills, fever, malaise/fatigue and weight loss.  HENT: Negative.   Eyes: Negative.   Respiratory: Negative.   Cardiovascular: Negative.   Gastrointestinal: Negative.  Negative for abdominal pain, blood in stool, constipation, diarrhea, melena, nausea and vomiting.  Genitourinary: Negative.   Musculoskeletal: Negative.   Skin: Negative.   Neurological: Negative.  Negative for weakness.  Endo/Heme/Allergies: Negative.   Psychiatric/Behavioral: Negative.     Past Medical History:  Diagnosis Date  . Adenocarcinoma of sigmoid colon ([place]) 06/11/2011  . High cholesterol   . Hypertension     Past Surgical History:  Procedure Laterality Date  . COLON SURGERY    . COLONOSCOPY  07/10/2012   Procedure: COLONOSCOPY;  Surgeon: Rogene Houston, MD;  Location: [facility];  Service: Endoscopy;  Laterality: N/A;  1200    Family History  Problem Relation Age of Onset  . Cancer Father     Social History   Social History  . Marital status:  Married    Spouse name: N/A  . Number of children: N/A  . Years of education: N/A   Social History Main Topics  . Smoking status: Former Research scientist (life sciences)  . Smokeless tobacco: Former Systems developer  . Alcohol use Yes     Comment: occasional glass of wine or beer  . Drug use: No  . Sexual activity: Not Asked   Other Topics Concern  . None   Social History Narrative  . None     PHYSICAL EXAMINATION  ECOG PERFORMANCE STATUS: 0 - Asymptomatic  Vitals:   01/17/16 1400  BP: 134/75  Pulse: 72  Resp: 16  Temp: 98.3 F (36.8 C)    GENERAL:alert, healthy, no distress, well nourished, well developed, comfortable, cooperative, smiling and unaccompanied SKIN: skin color, texture, turgor are normal, no rashes or significant lesions HEAD: Normocephalic, No masses, lesions, tenderness or abnormalities EYES: normal, EOMI, Conjunctiva are pink and non-injected EARS: External ears normal OROPHARYNX:lips, buccal mucosa, and tongue normal and mucous membranes are moist  NECK: supple, no adenopathy, thyroid normal size, non-tender, without nodularity, trachea midline LYMPH:  no palpable lymphadenopathy, no hepatosplenomegaly BREAST:not examined LUNGS: clear to auscultation and percussion HEART: regular rate & rhythm, no murmurs, no gallops, S1 normal and S2 normal ABDOMEN:abdomen soft, non-tender and normal bowel sounds BACK: Back symmetric, no curvature. EXTREMITIES:less then 2 second capillary refill, no joint deformities, effusion, or inflammation, no skin discoloration, no cyanosis  NEURO: alert & oriented x 3 with fluent speech, no focal motor/sensory deficits, gait normal   LABORATORY DATA: CBC    Component Value  Date/Time   WBC 7.1 01/17/2016 1400   RBC 4.94 01/17/2016 1400   HGB 15.5 01/17/2016 1400   HCT 43.9 01/17/2016 1400   PLT 155 01/17/2016 1400   MCV 88.9 01/17/2016 1400   MCH 31.4 01/17/2016 1400   MCHC 35.3 01/17/2016 1400   RDW 12.9 01/17/2016 1400   LYMPHSABS 2.3 01/17/2016  1400   MONOABS 0.6 01/17/2016 1400   EOSABS 0.1 01/17/2016 1400   BASOSABS 0.0 01/17/2016 1400      Chemistry      Component Value Date/Time   NA 139 01/17/2016 1400   K 4.1 01/17/2016 1400   CL 106 01/17/2016 1400   CO2 27 01/17/2016 1400   BUN 14 01/17/2016 1400   CREATININE 0.82 01/17/2016 1400      Component Value Date/Time   CALCIUM 9.3 01/17/2016 1400   ALKPHOS 76 01/17/2016 1400   AST 25 01/17/2016 1400   ALT 32 01/17/2016 1400   BILITOT 0.6 01/17/2016 1400     Lab Results  Component Value Date   CEA 1.3 12/16/2014      PENDING LABS:   RADIOGRAPHIC STUDIES:  No results found.   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 Romona Curls and Dr. Michela Pitcher on 07/25/2005 followed by combination chemotherapy and radiation therapy followed by oxaliplatinum-based chemotherapy in addition to bevacizumab. Thus far he remains disease free. CAT scans are too expensive for him to afford and he has not had one for 2 years. His last CAT scan was on 06/29/2010 of the chest abdomen and pelvis which showed no evidence for recurrent disease.  Labs today: CBC diff, CMET, CEA.  I personally reviewed and went over laboratory results with the patient.  The results are noted within this dictation.  Labs in 1 year: CBC diff, CMET, CEA.  Return in 1 year for follow-up.  We would be glad to see him sooner if necessary.  This year marks his 10 year anniversary from definitive surgery and adjuvant treatment.  Will refer him into survivorship program for ongoing survivorship with annual follow-up.   ORDERS PLACED FOR THIS ENCOUNTER: No orders of the defined types were placed in this encounter.   MEDICATIONS PRESCRIBED THIS ENCOUNTER: No orders of the defined types were placed in this encounter.   THERAPY PLAN:    NCCN guidelines for surveillance for Colon cancer are as follows (1.2017):  A. Stage I   1. Colonoscopy at year 1    A. If advanced adenoma, repeat in 1 year    B. If no advanced adenoma, repeat in 3 years, and then every 5 years.  B. Stage II, Stage III   1. H+P every 3-6 months x 2 years and then every 6 months for a total of 5 years    2. CEA every 3-6 months x 2 years and then every 6 months for a total of 5 years    3. CT CAP every 6-12 months (category 2B for frequency < 12 months) for a total of 5 years .   4.  Colonoscopy in 1 year except if no preoperative colonoscopy due to obstructing lesion, colonoscopy in 3-6 months.     A. If advanced adenoma, repeat in 1 year    B. If no advanced adenoma, repeat in 3 years, then every 5 years   5. PET/CT  scan is not recommended.  C. Stage IV   1. H+P every 3-6 months x 2 years and then every 6 months for a total of 5 years    2. CEA every 3 months x 2 years and then every 6 months for a total of 3- 5 years    3. CT CAP every 3-6 months (category 2B for frequency < 6 months) x 2 years., then every 6-12 months for a total of 5 years .   4. Colonoscopy in 1 year except if no preoperative colonoscopy due to obstructing lesion, colonoscopy in 3-6 months.     A. If advanced adenoma, repeat in 1 year    B. If no advanced adenoma, repeat in 3 years, then every 5 years   All questions were answered. The patient knows to call the clinic with any problems, questions or concerns. We can certainly see the patient much sooner if necessary.  Patient and plan discussed with Dr. Ancil Linsey and she is in agreement with the aforementioned.   This note is electronically signed by: Robynn Pane, PA-C 01/17/2016 10:00 PM

## 2016-01-18 LAB — CEA: CEA: 1.3 ng/mL (ref 0.0–4.7)

## 2016-01-20 ENCOUNTER — Encounter (INDEPENDENT_AMBULATORY_CARE_PROVIDER_SITE_OTHER): Payer: Self-pay | Admitting: *Deleted

## 2016-01-20 ENCOUNTER — Telehealth (INDEPENDENT_AMBULATORY_CARE_PROVIDER_SITE_OTHER): Payer: Self-pay | Admitting: *Deleted

## 2016-01-20 NOTE — Telephone Encounter (Signed)
Patient needs trilyte 

## 2016-01-24 MED ORDER — PEG 3350-KCL-NA BICARB-NACL 420 G PO SOLR: 4000.0000 mL | Freq: Once | ORAL | 0 refills | Status: AC

## 2016-02-06 ENCOUNTER — Telehealth (INDEPENDENT_AMBULATORY_CARE_PROVIDER_SITE_OTHER): Payer: Self-pay | Admitting: *Deleted

## 2016-02-06 NOTE — Telephone Encounter (Signed)
Referring MD/PCP: dondiego   Procedure: tcs  Reason/Indication:  Hx colon ca, hx polyps, fam hx colon ca  Has patient had this procedure before?  Yes, 2014 -- epic  If so, when, by whom and where?    Is there a family history of colon cancer?  Yes, father  Who?  What age when diagnosed?    Is patient diabetic?   no      Does patient have prosthetic heart valve or mechanical valve?  no  Do you have a pacemaker?  no  Has patient ever had endocarditis? no  Has patient had joint replacement within last 12 months?  no  Does patient tend to be constipated or take laxatives? no  Does patient have a history of alcohol/drug use?  no  Is patient on Coumadin, Plavix and/or Aspirin? no  Medications: simvastatin 40 mg daily, amlodipine 10 mg daily, lisinopril 10 mg daily  Allergies: nkda  Medication Adjustment:   Procedure date & time: 02/29/16 at 1030

## 2016-02-08 NOTE — Telephone Encounter (Signed)
agree

## 2016-02-29 ENCOUNTER — Encounter (HOSPITAL_COMMUNITY): Payer: Self-pay | Admitting: *Deleted

## 2016-02-29 ENCOUNTER — Ambulatory Visit (HOSPITAL_COMMUNITY)
Admission: RE | Admit: 2016-02-29 | Discharge: 2016-02-29 | Disposition: A | Discharge status: Home / Self Care | Payer: Medicare Other | Source: Ambulatory Visit | Attending: Internal Medicine | Admitting: Internal Medicine

## 2016-02-29 ENCOUNTER — Encounter (HOSPITAL_COMMUNITY)
Admission: RE | Disposition: A | Discharge status: Home / Self Care | Payer: Self-pay | Source: Ambulatory Visit | Attending: Internal Medicine

## 2016-02-29 DIAGNOSIS — Z933 Colostomy status: Secondary | ICD-10-CM | POA: Diagnosis not present

## 2016-02-29 DIAGNOSIS — I10 Essential (primary) hypertension: Secondary | ICD-10-CM | POA: Insufficient documentation

## 2016-02-29 DIAGNOSIS — Z1211 Encounter for screening for malignant neoplasm of colon: Secondary | ICD-10-CM | POA: Diagnosis present

## 2016-02-29 DIAGNOSIS — Z85038 Personal history of other malignant neoplasm of large intestine: Secondary | ICD-10-CM | POA: Insufficient documentation

## 2016-02-29 DIAGNOSIS — Z800 Family history of malignant neoplasm of digestive organs: Secondary | ICD-10-CM

## 2016-02-29 DIAGNOSIS — Z79899 Other long term (current) drug therapy: Secondary | ICD-10-CM | POA: Diagnosis not present

## 2016-02-29 DIAGNOSIS — Z923 Personal history of irradiation: Secondary | ICD-10-CM | POA: Insufficient documentation

## 2016-02-29 DIAGNOSIS — K6289 Other specified diseases of anus and rectum: Secondary | ICD-10-CM

## 2016-02-29 DIAGNOSIS — E7800 Pure hypercholesterolemia, unspecified: Secondary | ICD-10-CM | POA: Insufficient documentation

## 2016-02-29 DIAGNOSIS — Z9221 Personal history of antineoplastic chemotherapy: Secondary | ICD-10-CM | POA: Insufficient documentation

## 2016-02-29 DIAGNOSIS — K5730 Diverticulosis of large intestine without perforation or abscess without bleeding: Secondary | ICD-10-CM | POA: Insufficient documentation

## 2016-02-29 DIAGNOSIS — Z87891 Personal history of nicotine dependence: Secondary | ICD-10-CM | POA: Insufficient documentation

## 2016-02-29 DIAGNOSIS — Z86010 Personal history of colonic polyps: Secondary | ICD-10-CM

## 2016-02-29 HISTORY — PX: COLONOSCOPY: SHX5424

## 2016-02-29 SURGERY — COLONOSCOPY
Anesthesia: Moderate Sedation

## 2016-02-29 MED ORDER — STERILE WATER FOR IRRIGATION IR SOLN
Status: DC | PRN
  Administered 2016-02-29: 2.5 mL

## 2016-02-29 MED ORDER — SODIUM CHLORIDE 0.9 % IV SOLN
INTRAVENOUS | Status: DC
  Administered 2016-02-29: 1000 mL via INTRAVENOUS

## 2016-02-29 MED ORDER — MIDAZOLAM HCL 5 MG/5ML IJ SOLN
INTRAMUSCULAR | Status: AC
  Filled 2016-02-29: qty 10

## 2016-02-29 MED ORDER — MEPERIDINE HCL 50 MG/ML IJ SOLN
INTRAMUSCULAR | Status: DC | PRN
  Administered 2016-02-29 (×2): 25 mg via INTRAVENOUS

## 2016-02-29 MED ORDER — HYDROCORTISONE ACETATE 25 MG RE SUPP: 25.0000 mg | Freq: Every day | RECTAL | 1 refills | Status: DC

## 2016-02-29 MED ORDER — MEPERIDINE HCL 50 MG/ML IJ SOLN
INTRAMUSCULAR | Status: AC
  Filled 2016-02-29: qty 1

## 2016-02-29 MED ORDER — MIDAZOLAM HCL 5 MG/5ML IJ SOLN
INTRAMUSCULAR | Status: DC | PRN
  Administered 2016-02-29 (×2): 2 mg via INTRAVENOUS
  Administered 2016-02-29: 3 mg via INTRAVENOUS

## 2016-02-29 NOTE — Discharge Instructions (Signed)
Resume usual medications and diet. Anusol HC suppository 1 per rectum daily at bedtime for 2 weeks. No driving for 24 hours. Patient will call with biopsy results. Next colonoscopy in 5 years.   Colonoscopy, Care After These instructions give you information on caring for yourself after your procedure. Your doctor may also give you more specific instructions. Call your doctor if you have any problems or questions after your procedure. HOME CARE  Do not drive for 24 hours.  Do not sign important papers or use machinery for 24 hours.  You may shower.  You may go back to your usual activities, but go slower for the first 24 hours.  Take rest breaks often during the first 24 hours.  Walk around or use warm packs on your belly (abdomen) if you have belly cramping or gas.  Drink enough fluids to keep your pee (urine) clear or pale yellow.  Resume your normal diet. Avoid heavy or fried foods.  Avoid drinking alcohol for 24 hours or as told by your doctor.  Only take medicines as told by your doctor. If a tissue sample (biopsy) was taken during the procedure:   Do not take aspirin or blood thinners for 7 days, or as told by your doctor.  Do not drink alcohol for 7 days, or as told by your doctor.  Eat soft foods for the first 24 hours. GET HELP IF: You still have a small amount of blood in your poop (stool) 2-3 days after the procedure. GET HELP RIGHT AWAY IF:  You have more than a small amount of blood in your poop.  You see clumps of tissue (blood clots) in your poop.  Your belly is puffy (swollen).  You feel sick to your stomach (nauseous) or throw up (vomit).  You have a fever.  You have belly pain that gets worse and medicine does not help. MAKE SURE YOU:  Understand these instructions.  Will watch your condition.  Will get help right away if you are not doing well or get worse.   This information is not intended to replace advice given to you by your health  care provider. Make sure you discuss any questions you have with your health care provider.   Document Released: 07/14/2010 Document Revised: 06/16/2013 Document Reviewed: 02/16/2013 Elsevier Interactive Patient Education [employer].

## 2016-02-29 NOTE — Op Note (Signed)
[facility] Patient Name: Walter Orozco Procedure Date: 02/29/2016 10:47 AM MRN: [id number] Date of Birth: 1945/09/18 Attending MD: Hildred Laser , MD CSN: [id number] Age: 70 Admit Type: Outpatient Procedure:                Colonoscopy via colostomy and examination of rectal                            stump. Indications:              High risk colon cancer surveillance: Personal                            history of colon cancer Providers:                Hildred Laser, MD, Lurline Del, RN, Randa Spike,                            Technician Referring MD:             Ralene Bathe. Dondiego, MD Medicines:                Meperidine 50 mg IV, Midazolam 7 mg IV Complications:            No immediate complications. Estimated Blood Loss:     Estimated blood loss was minimal. Procedure:                Pre-Anesthesia Assessment:                           - Prior to the procedure, a History and Physical                            was performed, and patient medications and                            allergies were reviewed. The patient's tolerance of                            previous anesthesia was also reviewed. The risks                            and benefits of the procedure and the sedation                            options and risks were discussed with the patient.                            All questions were answered, and informed consent                            was obtained. Prior Anticoagulants: The patient                            last took ibuprofen 14 days prior to the procedure.  ASA Grade Assessment: II - A patient with mild                            systemic disease. After reviewing the risks and                            benefits, the patient was deemed in satisfactory                            condition to undergo the procedure.                           After obtaining informed consent, the colonoscope                            was passed  under direct vision. Throughout the                            procedure, the patient's blood pressure, pulse, and                            oxygen saturations were monitored continuously. The                            [id number]) was introduced through the                            stoma and advanced to the the cecum, identified by                            appendiceal orifice and ileocecal valve. rectal                            stump was also examined via anus.The colonoscopy                            was performed without difficulty. The patient                            tolerated the procedure well. The quality of the                            bowel preparation was excellent. Scope was not                            retroflexed in the rectum. The ileocecal valve,                            appendiceal orifice, and rectum were photographed. Scope In: 11:06:25 AM Scope Out: 11:22:27 AM Scope Withdrawal Time: 0 hours 12 minutes 30 seconds  Total Procedure Duration: 0 hours 16 minutes 2 seconds  Findings:      Scattered medium-mouthed diverticula were found in the sigmoid colon,       descending colon, transverse  colon, hepatic flexure and ascending colon.      A diffuse area of moderately altered vascular, congested, erythematous,       eroded (linear-pattern) and friable (with contact bleeding) mucosa was       found in the rectum. Biopsies were taken with a cold forceps for       histology. Impression:               - Diverticulosis in the sigmoid colon, in the                            descending colon, in the transverse colon, at the                            hepatic flexure and in the ascending colon.                           - Altered vascular, congested, erythematous, eroded                            (linear-pattern) and friable (with contact                            bleeding) mucosa in the rectum.ndoscopic changes                            suggestive  ofdiversion colitis. Biopsied. Moderate Sedation:      Moderate (conscious) sedation was administered by the endoscopy nurse       and supervised by the endoscopist. The following parameters were       monitored: oxygen saturation, heart rate, blood pressure, CO2       capnography and response to care. Total physician intraservice time was       24 minutes. Recommendation:           - Patient has a contact number available for                            emergencies. The signs and symptoms of potential                            delayed complications were discussed with the                            patient. Return to normal activities tomorrow.                            Written discharge instructions were provided to the                            patient.                           - Patient has a contact number available for                            emergencies. The signs and symptoms of potential  delayed complications were discussed with the                            patient. Return to normal activities tomorrow.                            Written discharge instructions were provided to the                            patient.                           - High fiber diet today.                           - Continue present medications.                           - Use hydrocortisone suppository 25 mg 1 per rectum                            once a day.                           - Await pathology results.                           - Repeat colonoscopy in 5 years for surveillance. Procedure Code(s):        --- Professional ---                           [id number], Colonoscopy, flexible; with biopsy, single                            or multiple                           99152, Moderate sedation services provided by the                            same physician or other qualified health care                            professional performing the diagnostic or                             therapeutic service that the sedation supports,                            requiring the presence of an independent trained                            observer to assist in the monitoring of the                            patient's level of consciousness and physiological  status; initial 15 minutes of intraservice time,                            patient age 23 years or older                           [phone], Moderate sedation services; each additional                            15 minutes intraservice time Diagnosis Code(s):        --- Professional ---                           [phone], Personal history of other malignant                            neoplasm of large intestine                           K62.89, Other specified diseases of anus and rectum                           K62.5, Hemorrhage of anus and rectum                           K57.30, Diverticulosis of large intestine without                            perforation or abscess without bleeding CPT copyright 2016 American Medical Association. All rights reserved. The codes documented in this report are preliminary and upon coder review may  be revised to meet current compliance requirements. Hildred Laser, MD Hildred Laser, MD 02/29/2016 11:34:13 AM This report has been signed electronically. Number of Addenda: 0

## 2016-02-29 NOTE — H&P (Signed)
Walter Orozco is an 70 y.o. male.   Chief Complaint: Patient is here for colonoscopy. HPI: Patient is 70 year old Caucasian male with history of CRC and is here for surveillance colonoscopy. He underwent sigmoid colon resection back in January 2007. He also had chew and chemotherapy and radiation therapy. Last colonoscopy was in January 2014. He still has rectosigmoid stump in place. Family history significant for CRC in his father who was in his mid 27s at the time of diagnosis and died at age 8 of unrelated causes.  Past Medical History:  Diagnosis Date  . Adenocarcinoma of sigmoid colon ([place]) 06/11/2011  . High cholesterol   . Hypertension     Past Surgical History:  Procedure Laterality Date  . COLON SURGERY    . COLONOSCOPY  07/10/2012   Procedure: COLONOSCOPY;  Surgeon: Rogene Houston, MD;  [facility];  Service: Endoscopy;  Laterality: N/A;  1200    Family History  Problem Relation Age of Onset  . Cancer Father    Social History:  reports that he has quit smoking. He has quit using smokeless tobacco. He reports that he drinks alcohol. He reports that he does not use drugs.  Allergies: No Known Allergies  Medications Prior to Admission  Medication Sig Dispense Refill  . amLODipine (NORVASC) 10 MG tablet Take 10 mg by mouth daily.    Marland Kitchen lisinopril (PRINIVIL,ZESTRIL) 10 MG tablet Take 10 mg by mouth daily.    . simvastatin (ZOCOR) 40 MG tablet Take 40 mg by mouth daily.    Marland Kitchen ibuprofen (ADVIL) 200 MG tablet Take 200 mg by mouth every 6 (six) hours as needed for pain.      No results found for this or any previous visit (from the past 48 hour(s)). No results found.  ROS  Blood pressure 130/88, pulse 98, temperature 98.2 F (36.8 C), temperature source Oral, resp. rate 20, height 5\' 6"  (1.676 m), weight 171 lb (77.6 kg), SpO2 98 %. Physical Exam  Constitutional: He appears well-developed and well-nourished.  HENT:  Mouth/Throat: Oropharynx is clear and moist.   Eyes: Conjunctivae are normal. No scleral icterus.  Neck: No thyromegaly present.  Cardiovascular: Normal rate, regular rhythm and normal heart sounds.   No murmur heard. Respiratory: Effort normal and breath sounds normal.  GI:  Abdomen is full with midline scar. He has colostomy in left lower quadrant of his abdomen. Small scar also noted in right low quadrant. Abdomen is soft and nontender without organomegaly or masses.  Musculoskeletal: He exhibits no edema.  Lymphadenopathy:    He has no cervical adenopathy.  Neurological: He is alert.  Skin: Skin is warm and dry.     Assessment/Plan History of CRC. Surveillance colonoscopy.  Hildred Laser, MD 02/29/2016, 10:52 AM

## 2016-03-05 ENCOUNTER — Encounter (HOSPITAL_COMMUNITY): Payer: Self-pay | Admitting: Internal Medicine

## 2016-04-10 ENCOUNTER — Encounter (HOSPITAL_COMMUNITY): Payer: Medicare Other | Attending: Adult Health | Admitting: Adult Health

## 2016-04-10 DIAGNOSIS — C187 Malignant neoplasm of sigmoid colon: Secondary | ICD-10-CM

## 2016-04-10 DIAGNOSIS — Z85038 Personal history of other malignant neoplasm of large intestine: Secondary | ICD-10-CM | POA: Diagnosis present

## 2016-04-10 NOTE — Progress Notes (Addendum)
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 Romona Curls & Dr. Michela Pitcher  -Adjuvant chemoradiation with Taxol and Oxiplatinum-based chemotherapy and bevacizumab -Last restaging CT 06/29/2010 and was negative for recurrent disease.    INTERVAL HISTORY:  Mr. Slaski presents to the survivorship clinic for our initial meeting and to provide additional support for his history of stage III colon cancer. He has overall been feeling very well. His last colonoscopy was one month ago with Dr. Shonna Chock; he was told "everything is fine and we will see you in 5 years."  Mr. Villafuerte was excited about this news.  He denies any abdominal pain, diarrhea, constipation, blood in his stools, melena, difficulty urinating, or hematuria. 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  His energy levels and appetite are good.  He remains very active. He does not do any formal exercise program, but plays golf at least once a week. He also [place] on [place] acres and stays active doing yard work.  He also wears a fitness and averages about 5000-7000 steps per day.  He continues to work 1/2 day per week at [employer].   Mr. Desrochers is married and lives with his wife of 22 years in [place], [place]; he is originally from [place]. His mother-in-law also lives with the patient and his wife, as she has Alzheimer's and they are her caretakers.  They have 2 children, one daughter and one son. Their daughter Elzie Rings is divorced & works in Scientist, research (physical sciences). Their son Marjory Lies is married to a nurse & he works as a Music therapist; IT trainer both live in [place], [place]. Mr. Klaes does not have any grandchildren. Mr. 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    ADDITIONAL REVIEW OF SYSTEMS:  Review of Systems  Constitutional: Negative.   HENT: Negative.   Eyes: Negative.   Respiratory: Negative.   Cardiovascular: Negative.   Gastrointestinal: Negative for blood in stool, constipation, diarrhea and melena.  Genitourinary: Negative for dysuria and hematuria.  Musculoskeletal: Negative.   Skin: Negative.   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      PAST MEDICAL & SURGICAL HISTORY:  Past Medical History:  Diagnosis Date  . Adenocarcinoma of sigmoid colon ([place]) 06/11/2011  . High cholesterol   . Hypertension    Past Surgical History:  Procedure Laterality Date  . COLON SURGERY    . COLONOSCOPY  07/10/2012   Procedure: COLONOSCOPY;  Surgeon: Rogene Houston, MD;  [facility];  Service: Endoscopy;  Laterality: N/A;  1200  . COLONOSCOPY N/A 02/29/2016   Procedure: COLONOSCOPY;  Surgeon: Rogene Houston, MD;  [facility];  Service: Endoscopy;  Laterality: N/A;  1200 - moved to 9/6 @ 10:30     HEALTH MAINTENANCE:  -Last physical with PCP: Sees Dr. Bea Graff every 6 months for blood work and physical -Last PSA: Sees Dr. Jeffie Pollock at [facility] annually for mildly elevated PSA and enlarged prostate -  Last skin exam: Sees a dermatologist in [place] annually.  -Vaccines: Patient elects not to get the flu shot.    SOCIAL HISTORY:  Mr. Hornbeak is married and lives with his wife of 62 years in [place], [place]; he is originally from [place]. His mother-in-law also lives with the patient and his wife, as she has Alzheimer's and they are her caretakers.  They have 2 children, one daughter and one son. Their daughter Elzie Rings is divorced & works in Scientist, research (physical sciences). Their son  Marjory Lies is married to a nurse & he works as a Music therapist; IT trainer both live in [place], [place]. Mr. Oppegard does not have any grandchildren. Mr. 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  He is a former smoker; he denies any current tobacco use. He drinks alcohol occasionally. He denies any illicit drug use.   CURRENT MEDICATIONS:  Current Outpatient Prescriptions on File Prior to Visit  Medication Sig Dispense Refill  . amLODipine (NORVASC) 10 MG tablet Take 10 mg by mouth daily.    . hydrocortisone (ANUSOL-HC) 25 MG suppository Place 1 suppository (25 mg total) rectally at bedtime. 14 suppository 1  . ibuprofen (ADVIL) 200 MG tablet Take 200 mg by mouth every 6 (six) hours as needed for pain.    Marland Kitchen lisinopril (PRINIVIL,ZESTRIL) 10 MG tablet Take 10 mg by mouth daily.    . simvastatin (ZOCOR) 40 MG tablet Take 40 mg by mouth daily.     No current facility-administered medications on file prior to visit.     ALLERGIES: No Known Allergies  PHYSICAL EXAM:  General: Male in no acute distress.  Unaccompanied today.  HEENT: Head is normocephalic. Conjunctivae clear without exudate.  Sclerae anicteric.  Cardiovascular: Normal rate and rhythm Respiratory: Clear to auscultation bilaterally. Chest expansion symmetric without accessory muscle use. Breathing non-labored.    GU: Deferred.   GI: Soft, non-tender abdomen. Normoactive bowel sounds.  Neuro: No focal deficits. Steady gait.   Psych: Normal mood and affect for situation. Extremities: Trace ankle edema.  Skin: Warm and dry.   LABORATORY DATA: None for this visit.   DIAGNOSTIC IMAGING:  None for this visit.    ASSESSMENT & PLAN:  Mr. Kibby is a pleasant 70 y.o. male with history of Stage III (T4N1M0) adenocarcinoma Of the sigmoid colon with bladder invasion, diagnosed in 2007; treated  with surgical resection followed by chemoradiation with oxaliplatin and bevacizumab. Patient has remained without evidence of disease since that time.  Patient presents to survivorship clinic today for long-term survivorship visit.   1. Stage III adenocarcinoma of sigmoid colon:  Clinically, Mr. Oiler remains without evidence of disease.  His last surveillance CT chest, abdomen, pelvis was on 06/29/2010 and showed no evidence of recurrence. Given that he is now 10 years out from completion of his treatment, surveillance imaging is no longer recommended according to NCCN guidelines. Monitoring CEA levels are also not recommended beyond 5 years.  His last colonoscopy was in 02/2016 and was reportedly normal. He will return for subsequent colonoscopy in 5 years as recommended by his gastroenterologist, Dr. Shonna Chock.  He will maintain follow-up at [facility] in 12/2016 for continued surveillance.       2. Tobacco/alcohol education: I commended Mr. Greenslade continued efforts to remain tobacco-free.  He understands the importance of avoiding tobacco products as it  relates to his overall health and risk of cancer recurrence.  He is committed to abstaining from tobacco. Encouraged him to limit his alcohol consumption.   3. Physical activity/Healthy eating: Getting adequate physical activity and maintaining a healthy diet as a cancer survivor is important for overall wellness and reduces the risk of cancer recurrence. We discussed the [facility], which is a fitness program that is offered to cancer survivors free of charge.  We also reviewed "The Nutrition Rainbow" handout, which provides patient education on healthy fruits and vegetables and the anticancer properties of foods in certain color groups.  4. Health promotion/Cancer screening:   He is reportedly up-to-date on his PSA screening. He sees his PCP every 6 months for routine blood work and physical. He has a Paediatric nurse that he sees  annually for skin surveillance.  He declines the annual flu vaccine due to side effects he has had to the vaccine in the past. I encouraged him to maintain follow-up with his PCP and specialists for appropriate cancer screenings & vaccinations, as recommended.    5. Support services/Counseling: It is not uncommon for this period of the patient's cancer care trajectory to be one of many emotions and stressors.  Mr. Sharpley was encouraged to take advantage of our support services programs and support groups to better cope in his new life as a cancer survivor after completing anti-cancer treatment. Today, I provided support through active listening, validation of concerns, and expressive supportive counseling.    Dispo:  -Return to [facility] in 12/2016 for continued follow-up.    A total of 35 minutes was spent in face-to-face care of this patient, with greater than 50% of that time spent in counseling and care coordination.   Mike Craze, NP Survivorship Program [place] [phone]

## 2016-04-18 ENCOUNTER — Encounter (HOSPITAL_COMMUNITY): Payer: Self-pay | Admitting: Adult Health

## 2016-05-11 ENCOUNTER — Ambulatory Visit (INDEPENDENT_AMBULATORY_CARE_PROVIDER_SITE_OTHER): Payer: Medicare Other | Admitting: Urology

## 2016-05-11 DIAGNOSIS — N400 Enlarged prostate without lower urinary tract symptoms: Secondary | ICD-10-CM

## 2016-05-11 DIAGNOSIS — R972 Elevated prostate specific antigen [PSA]: Secondary | ICD-10-CM

## 2017-01-21 ENCOUNTER — Other Ambulatory Visit (HOSPITAL_COMMUNITY): Payer: Self-pay | Admitting: *Deleted

## 2017-01-21 DIAGNOSIS — C187 Malignant neoplasm of sigmoid colon: Secondary | ICD-10-CM

## 2017-01-22 ENCOUNTER — Ambulatory Visit (HOSPITAL_COMMUNITY): Payer: Medicare Other | Admitting: Oncology

## 2017-01-22 ENCOUNTER — Encounter (HOSPITAL_COMMUNITY): Payer: Medicare Other

## 2017-01-22 ENCOUNTER — Encounter (HOSPITAL_COMMUNITY): Payer: Medicare Other | Attending: Oncology | Admitting: Oncology

## 2017-01-22 ENCOUNTER — Encounter (HOSPITAL_COMMUNITY): Payer: Self-pay | Admitting: Oncology

## 2017-01-22 ENCOUNTER — Other Ambulatory Visit (HOSPITAL_COMMUNITY): Payer: Medicare Other

## 2017-01-22 DIAGNOSIS — Z85038 Personal history of other malignant neoplasm of large intestine: Secondary | ICD-10-CM | POA: Diagnosis present

## 2017-01-22 DIAGNOSIS — C187 Malignant neoplasm of sigmoid colon: Secondary | ICD-10-CM | POA: Diagnosis present

## 2017-01-22 DIAGNOSIS — G620 Drug-induced polyneuropathy: Secondary | ICD-10-CM

## 2017-01-22 LAB — CBC WITH DIFFERENTIAL/PLATELET
Basophils Absolute: 0 10*3/uL (ref 0.0–0.1)
Basophils Relative: 0 %
EOS ABS: 0.1 10*3/uL (ref 0.0–0.7)
EOS PCT: 1 %
HCT: 45.2 % (ref 39.0–52.0)
Hemoglobin: 15.6 g/dL (ref 13.0–17.0)
LYMPHS ABS: 1.7 10*3/uL (ref 0.7–4.0)
LYMPHS PCT: 23 %
MCH: 30.7 pg (ref 26.0–34.0)
MCHC: 34.5 g/dL (ref 30.0–36.0)
MCV: 89 fL (ref 78.0–100.0)
MONO ABS: 0.7 10*3/uL (ref 0.1–1.0)
Monocytes Relative: 10 %
Neutro Abs: 5 10*3/uL (ref 1.7–7.7)
Neutrophils Relative %: 66 %
PLATELETS: 178 10*3/uL (ref 150–400)
RBC: 5.08 MIL/uL (ref 4.22–5.81)
RDW: 12.8 % (ref 11.5–15.5)
WBC: 7.6 10*3/uL (ref 4.0–10.5)

## 2017-01-22 LAB — COMPREHENSIVE METABOLIC PANEL
ALT: 25 U/L (ref 17–63)
ANION GAP: 7 (ref 5–15)
AST: 25 U/L (ref 15–41)
Albumin: 4.4 g/dL (ref 3.5–5.0)
Alkaline Phosphatase: 71 U/L (ref 38–126)
BUN: 14 mg/dL (ref 6–20)
CHLORIDE: 106 mmol/L (ref 101–111)
CO2: 26 mmol/L (ref 22–32)
Calcium: 9.4 mg/dL (ref 8.9–10.3)
Creatinine, Ser: 0.78 mg/dL (ref 0.61–1.24)
GFR calc Af Amer: >60 mL/min (ref >60)
Glucose, Bld: 116 mg/dL — ABNORMAL HIGH (ref 65–99)
POTASSIUM: 3.9 mmol/L (ref 3.5–5.1)
SODIUM: 139 mmol/L (ref 135–145)
Total Bilirubin: 0.9 mg/dL (ref 0.3–1.2)
Total Protein: 7.3 g/dL (ref 6.5–8.1)

## 2017-01-22 NOTE — Patient Instructions (Addendum)
[place] at [facility] Discharge Instructions  RECOMMENDATIONS MADE BY THE CONSULTANT AND ANY TEST RESULTS WILL BE SENT TO YOUR REFERRING PHYSICIAN.  You were seen today by Kirby Crigler PA-C. Return in 12 months for labs and follow up.    Thank you for choosing [facility] at [facility] to provide your oncology and hematology care.  To afford each patient quality time with our provider, please arrive at least 15 minutes before your scheduled appointment time.    If you have a lab appointment with the [facility] please come in thru the  Main Entrance and check in at the main information desk  You need to re-schedule your appointment should you arrive 10 or more minutes late.  We strive to give you quality time with our providers, and arriving late affects you and other patients whose appointments are after yours.  Also, if you no show three or more times for appointments you may be dismissed from the clinic at the providers discretion.     Again, thank you for choosing [facility].  Our hope is that these requests will decrease the amount of time that you wait before being seen by our physicians.       _____________________________________________________________  Should you have questions after your visit to [facility], please contact our office at [phone] between the hours of 8:30 a.m. and 4:30 p.m.  Voicemails left after 4:30 p.m. will not be returned until the following business day.  For prescription refill requests, have your pharmacy contact our office.       Resources For Cancer Patients and their Caregivers ? American Cancer Society: Can assist with transportation, wigs, general needs, runs Look Good Feel Better.        (519)866-6997 ? Cancer Care: Provides financial assistance, online support groups, medication/co-pay assistance.  1-800-813-HOPE 918-016-4455) ? [place] Assists [place] cancer patients and their families through emotional , educational and financial support.  628-566-5470 ? [place] DSS Where to apply for food stamps, Medicaid and utility assistance. (980)233-2252 ? [facility]: Transportation to medical appointments. 640-547-8438 ? Social Security Administration: May apply for disability if have a Stage IV cancer. 253-605-4752 825-106-2027 ? [employer], Disability and Transit Services: Assists with nutrition, care and transit needs. [facility]erm[facility] Support Programs: @10RELATIVEDAYS @ > Cancer Support Group  2nd Tuesday of the month 1pm-2pm, Journey Room  > Creative Journey  3rd Tuesday of the month 1130am-1pm, Journey Room  > Look Good Feel Better  1st Wednesday of the month 10am-12 noon, Journey Room (Call [place] to register [phone])

## 2017-01-22 NOTE — Progress Notes (Signed)
Lucia Gaskins, MD [place] [address]  Adenocarcinoma of sigmoid colon [facility])  CURRENT THERAPY: Surveillance per NCCN guidelines  INTERVAL HISTORY: Walter Orozco 71 y.o. male returns for followup of Stage III (T4N1M0) adenocarcinoma of the sigmoid colon with invasion into a significant portion of his bladder. Status post resection by Dr. Romona Curls and Dr. Michela Pitcher on 07/25/2005 followed by combination chemotherapy and radiation, followed by oxaliplatin-based chemotherapy in addition to bevacizumab.   Thus far, he remains disease-free. Last CT scan on 06/29/2010 of chest, abdomen, and pelvis demonstrated NED.  Last colonoscopy by Dr. Laural Golden on 02/29/2016 demonstrating diverticulosis and diversion colitis.  HPI Elements   Location: Sigmoid colon   Quality: Adenocarcinoma   Severity: Stage III   Duration: Diagnosed in 2007   Context: Invasion into a significant portion of bladder   Timing: Status post resection by Dr. Romona Curls and Dr. Michela Pitcher on 07/25/2005.   Modifying Factors:   Associated Signs & Symptoms:     His daughter is pregnant and she will be due in October. This is his first grand child. It is a girl. He is very excited about this.  He has intentionally while lost weight over the last 6 months. He is making healthier eating choices and increasing his exercise. He is doing very well. He feels much improved since losing weight.  Review of Systems  Constitutional: Positive for weight loss (intentional). Negative for chills and fever.  HENT: Negative.   Eyes: Negative.   Respiratory: Negative.  Negative for cough.   Cardiovascular: Negative.  Negative for chest pain.  Gastrointestinal: Negative.  Negative for blood in stool, constipation, diarrhea, melena, nausea and vomiting.  Genitourinary: Negative.   Musculoskeletal: Negative.   Skin: Negative.   Neurological: Positive for sensory change (chemotherapy-induced peripheral neuropathy, stable,  chronic). Negative for weakness.  Endo/Heme/Allergies: Negative.   Psychiatric/Behavioral: Negative.     Past Medical History:  Diagnosis Date  . Adenocarcinoma of sigmoid colon ([place]) 06/11/2011  . High cholesterol   . Hypertension     Past Surgical History:  Procedure Laterality Date  . COLON SURGERY    . COLONOSCOPY  07/10/2012   Procedure: COLONOSCOPY;  Surgeon: Rogene Houston, MD;  [facility];  Service: Endoscopy;  Laterality: N/A;  1200  . COLONOSCOPY N/A 02/29/2016   Procedure: COLONOSCOPY;  Surgeon: Rogene Houston, MD;  [facility];  Service: Endoscopy;  Laterality: N/A;  1200 - moved to 9/6 @ 10:30    Family History  Problem Relation Age of Onset  . Cancer Father     Social History   Social History  . Marital status: Married    Spouse name: N/A  . Number of children: N/A  . Years of education: N/A   Social History Main Topics  . Smoking status: Former Research scientist (life sciences)  . Smokeless tobacco: Former Systems developer  . Alcohol use Yes     Comment: occasional glass of wine or beer  . Drug use: No  . Sexual activity: Not Asked   Other Topics Concern  . None   Social History Narrative  . None     PHYSICAL EXAMINATION  ECOG PERFORMANCE STATUS: 0 - Asymptomatic  Vitals:   01/22/17 1421  Pulse: 74  Resp: 16    Vitals - 1 value per visit 0/02/9832  SYSTOLIC 825  DIASTOLIC 78  Pulse 98  Temperature 98.2  Respirations 18  Weight (lb) 171    GENERAL:alert, no  distress, well nourished, well developed, comfortable, cooperative, smiling and unaccompanied SKIN: skin color, texture, turgor are normal, no rashes or significant lesions HEAD: Normocephalic, No masses, lesions, tenderness or abnormalities EYES: normal, EOMI, Conjunctiva are pink and non-injected EARS: External ears normal OROPHARYNX:lips, buccal mucosa, and tongue normal and mucous membranes are moist  NECK: supple, no adenopathy, trachea midline LYMPH:  no palpable  lymphadenopathy BREAST:not examined LUNGS: clear to auscultation and percussion HEART: regular rate & rhythm, no murmurs and no gallops ABDOMEN:abdomen soft, non-tender, normal bowel sounds and no masses or organomegaly BACK: Back symmetric, no curvature. EXTREMITIES:less then 2 second capillary refill, no joint deformities, effusion, or inflammation, no skin discoloration, no cyanosis  NEURO: alert & oriented x 3 with fluent speech, no focal motor/sensory deficits, gait normal   LABORATORY DATA: CBC    Component Value Date/Time   WBC 7.6 01/22/2017 1329   RBC 5.08 01/22/2017 1329   HGB 15.6 01/22/2017 1329   HCT 45.2 01/22/2017 1329   PLT 178 01/22/2017 1329   MCV 89.0 01/22/2017 1329   MCH 30.7 01/22/2017 1329   MCHC 34.5 01/22/2017 1329   RDW 12.8 01/22/2017 1329   LYMPHSABS 1.7 01/22/2017 1329   MONOABS 0.7 01/22/2017 1329   EOSABS 0.1 01/22/2017 1329   BASOSABS 0.0 01/22/2017 1329      Chemistry      Component Value Date/Time   NA 139 01/22/2017 1329   K 3.9 01/22/2017 1329   CL 106 01/22/2017 1329   CO2 26 01/22/2017 1329   BUN 14 01/22/2017 1329   CREATININE 0.78 01/22/2017 1329      Component Value Date/Time   CALCIUM 9.4 01/22/2017 1329   ALKPHOS 71 01/22/2017 1329   AST 25 01/22/2017 1329   ALT 25 01/22/2017 1329   BILITOT 0.9 01/22/2017 1329     Lab Results  Component Value Date   CEA 1.3 01/17/2016    PENDING LABS:   RADIOGRAPHIC STUDIES:  No results found.   PATHOLOGY:    ASSESSMENT AND PLAN:  Adenocarcinoma of sigmoid colon ([place]) Stage III (T4N1M0) adenocarcinoma of the sigmoid colon with invasion into a significant portion of his bladder. Status post resection by Dr. Romona Curls and Dr. Michela Pitcher on 07/25/2005 followed by combination chemotherapy and radiation, followed by oxaliplatin-based chemotherapy in addition to bevacizumab.   Thus far, he remains disease-free. Last CT scan on 06/29/2010 of chest, abdomen, and pelvis demonstrated NED.   Last colonoscopy by Dr. Laural Golden on 02/29/2016 demonstrating diverticulosis and diversion colitis.  Labs today: CBC diff, CMET, CEA.  I personally reviewed and went over laboratory results with the patient.  The results are noted within this dictation.  Labs in 12 months: CBC diff, CMET, CEA.  Return in 12 months for follow-up and ongoing long-term survivorship.  He wishes to continue annual follow-up with [place].  He has completed surveillance in accordance with NCCN guidelines.  ORDERS PLACED FOR THIS ENCOUNTER: No orders of the defined types were placed in this encounter.   MEDICATIONS PRESCRIBED THIS ENCOUNTER: Meds ordered this encounter  Medications  . naproxen (NAPROSYN) 500 MG tablet    THERAPY PLAN:  NCCN guidelines for surveillance for Colon cancer are as follows (2.2018):  A. Stage I   1. Colonoscopy at year 1    A. If advanced adenoma, repeat in 1 year    B. If no advanced adenoma, repeat in 3 years, and then every 5 years.  B. Stage II, Stage III   1. H+P every 3-6 months x  2 years and then every 6 months for a total of 5 years    2. CEA every 3-6 months x 2 years and then every 6 months for a total of 5 years    3. CT CAP every 6-12 months (category 2B for frequency < 12 months) for a total of 5 years .   4.  Colonoscopy in 1 year except if no preoperative colonoscopy due to obstructing lesion, colonoscopy in 3-6 months.     A. If advanced adenoma, repeat in 1 year    B. If no advanced adenoma, repeat in 3 years, then every 5 years   5. PET/CT scan is not recommended.  C. Stage IV   1. H+P every 3-6 months x 2 years and then every 6 months for a total of 5 years    2. CEA every 3 months x 2 years and then every 6 months for a total of 3- 5 years    3. CT CAP every 3-6 months (category 2B for frequency < 6 months) x 2 years, then every 6-12 months for a total of 5 years .   4. Colonoscopy in 1 year except if no preoperative colonoscopy due to obstructing lesion, colonoscopy  in 3-6 months.     A. If advanced adenoma, repeat in 1 year    B. If no advanced adenoma, repeat in 3 years, then every 5 years  All questions were answered. The patient knows to call the clinic with any problems, questions or concerns. We can certainly see the patient much sooner if necessary.  Patient and plan discussed with Dr. Twana First and she is in agreement with the aforementioned.   This note is electronically signed by: Doy Mince 01/22/2017 2:32 PM

## 2017-01-22 NOTE — Assessment & Plan Note (Addendum)
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 Status post resection by Dr. Romona Curls and Dr. Michela Pitcher on 07/25/2005 followed by combination chemotherapy and radiation, followed by oxaliplatin-based chemotherapy in addition to bevacizumab.   Thus far, he remains disease-free. Last CT scan on 06/29/2010 of chest, abdomen, and pelvis demonstrated NED.  Last colonoscopy by Dr. Laural Golden on 02/29/2016 demonstrating diverticulosis and diversion colitis.  Labs today: CBC diff, CMET, CEA.  I personally reviewed and went over laboratory results with the patient.  The results are noted within this dictation.  Labs in 12 months: CBC diff, CMET, CEA.  Return in 12 months for follow-up and ongoing long-term survivorship.  He wishes to continue annual follow-up with [place].  He has completed surveillance in accordance with NCCN guidelines.

## 2017-01-23 LAB — CEA: CEA: 1.5 ng/mL (ref 0.0–4.7)

## 2017-04-16 ENCOUNTER — Other Ambulatory Visit (HOSPITAL_COMMUNITY): Payer: Self-pay | Admitting: Family Medicine

## 2017-04-16 ENCOUNTER — Ambulatory Visit (HOSPITAL_COMMUNITY)
Admission: RE | Admit: 2017-04-16 | Discharge: 2017-04-16 | Disposition: A | Discharge status: Home / Self Care | Payer: Medicare Other | Source: Ambulatory Visit | Attending: Family Medicine | Admitting: Family Medicine

## 2017-04-16 ENCOUNTER — Other Ambulatory Visit: Payer: Self-pay | Admitting: Orthopedic Surgery

## 2017-04-16 DIAGNOSIS — M85812 Other specified disorders of bone density and structure, left shoulder: Secondary | ICD-10-CM | POA: Insufficient documentation

## 2017-04-16 DIAGNOSIS — M159 Polyosteoarthritis, unspecified: Secondary | ICD-10-CM

## 2017-04-16 DIAGNOSIS — M25512 Pain in left shoulder: Secondary | ICD-10-CM | POA: Insufficient documentation

## 2017-04-16 DIAGNOSIS — M150 Primary generalized (osteo)arthritis: Secondary | ICD-10-CM

## 2017-04-16 DIAGNOSIS — M19012 Primary osteoarthritis, left shoulder: Secondary | ICD-10-CM | POA: Insufficient documentation

## 2017-05-10 ENCOUNTER — Ambulatory Visit (INDEPENDENT_AMBULATORY_CARE_PROVIDER_SITE_OTHER): Payer: Medicare Other | Admitting: Urology

## 2017-05-10 DIAGNOSIS — R972 Elevated prostate specific antigen [PSA]: Secondary | ICD-10-CM | POA: Diagnosis not present

## 2017-05-10 DIAGNOSIS — N400 Enlarged prostate without lower urinary tract symptoms: Secondary | ICD-10-CM

## 2018-01-14 ENCOUNTER — Other Ambulatory Visit (HOSPITAL_COMMUNITY): Payer: Self-pay

## 2018-01-14 DIAGNOSIS — C187 Malignant neoplasm of sigmoid colon: Secondary | ICD-10-CM

## 2018-01-16 ENCOUNTER — Inpatient Hospital Stay (HOSPITAL_COMMUNITY): Payer: Medicare Other | Attending: Hematology

## 2018-01-16 DIAGNOSIS — Z809 Family history of malignant neoplasm, unspecified: Secondary | ICD-10-CM | POA: Diagnosis not present

## 2018-01-16 DIAGNOSIS — Z79899 Other long term (current) drug therapy: Secondary | ICD-10-CM | POA: Insufficient documentation

## 2018-01-16 DIAGNOSIS — I10 Essential (primary) hypertension: Secondary | ICD-10-CM | POA: Diagnosis not present

## 2018-01-16 DIAGNOSIS — Z923 Personal history of irradiation: Secondary | ICD-10-CM | POA: Diagnosis not present

## 2018-01-16 DIAGNOSIS — Z9221 Personal history of antineoplastic chemotherapy: Secondary | ICD-10-CM | POA: Diagnosis not present

## 2018-01-16 DIAGNOSIS — E7800 Pure hypercholesterolemia, unspecified: Secondary | ICD-10-CM | POA: Insufficient documentation

## 2018-01-16 DIAGNOSIS — G629 Polyneuropathy, unspecified: Secondary | ICD-10-CM | POA: Diagnosis not present

## 2018-01-16 DIAGNOSIS — C187 Malignant neoplasm of sigmoid colon: Secondary | ICD-10-CM

## 2018-01-16 DIAGNOSIS — Z87891 Personal history of nicotine dependence: Secondary | ICD-10-CM | POA: Insufficient documentation

## 2018-01-16 DIAGNOSIS — Z85048 Personal history of other malignant neoplasm of rectum, rectosigmoid junction, and anus: Secondary | ICD-10-CM | POA: Diagnosis present

## 2018-01-16 LAB — COMPREHENSIVE METABOLIC PANEL
ALBUMIN: 4.5 g/dL (ref 3.5–5.0)
ALK PHOS: 77 U/L (ref 38–126)
ALT: 26 U/L (ref 0–44)
AST: 23 U/L (ref 15–41)
Anion gap: 9 (ref 5–15)
BUN: 14 mg/dL (ref 8–23)
CALCIUM: 9.5 mg/dL (ref 8.9–10.3)
CHLORIDE: 104 mmol/L (ref 98–111)
CO2: 26 mmol/L (ref 22–32)
CREATININE: 0.8 mg/dL (ref 0.61–1.24)
GFR calc Af Amer: >60 mL/min (ref >60)
GFR calc non Af Amer: >60 mL/min (ref >60)
Glucose, Bld: 94 mg/dL (ref 70–99)
Potassium: 4.4 mmol/L (ref 3.5–5.1)
SODIUM: 139 mmol/L (ref 135–145)
Total Bilirubin: 0.9 mg/dL (ref 0.3–1.2)
Total Protein: 7.4 g/dL (ref 6.5–8.1)

## 2018-01-16 LAB — CBC WITH DIFFERENTIAL/PLATELET
Basophils Absolute: 0 10*3/uL (ref 0.0–0.1)
Basophils Relative: 0 %
Eosinophils Absolute: 0.1 10*3/uL (ref 0.0–0.7)
Eosinophils Relative: 2 %
HCT: 45.4 % (ref 39.0–52.0)
Hemoglobin: 15.8 g/dL (ref 13.0–17.0)
Lymphocytes Relative: 26 %
Lymphs Abs: 1.8 10*3/uL (ref 0.7–4.0)
MCH: 31.7 pg (ref 26.0–34.0)
MCHC: 34.8 g/dL (ref 30.0–36.0)
MCV: 91 fL (ref 78.0–100.0)
MONOS PCT: 10 %
Monocytes Absolute: 0.7 10*3/uL (ref 0.1–1.0)
Neutro Abs: 4.4 10*3/uL (ref 1.7–7.7)
Neutrophils Relative %: 62 %
PLATELETS: 172 10*3/uL (ref 150–400)
RBC: 4.99 MIL/uL (ref 4.22–5.81)
RDW: 12.9 % (ref 11.5–15.5)
WBC: 7 10*3/uL (ref 4.0–10.5)

## 2018-01-17 LAB — CEA: CEA: 1.3 ng/mL (ref 0.0–4.7)

## 2018-01-20 ENCOUNTER — Encounter (HOSPITAL_COMMUNITY): Payer: Self-pay | Admitting: Internal Medicine

## 2018-01-20 ENCOUNTER — Other Ambulatory Visit (HOSPITAL_COMMUNITY): Payer: BLUE CROSS/BLUE SHIELD

## 2018-01-20 ENCOUNTER — Inpatient Hospital Stay (HOSPITAL_BASED_OUTPATIENT_CLINIC_OR_DEPARTMENT_OTHER): Payer: Medicare Other | Admitting: Internal Medicine

## 2018-01-20 ENCOUNTER — Other Ambulatory Visit: Payer: Self-pay

## 2018-01-20 VITALS — BP 141/76 | HR 84 | Temp 98.2°F | Resp 20 | Wt 167.8 lb

## 2018-01-20 DIAGNOSIS — Z87891 Personal history of nicotine dependence: Secondary | ICD-10-CM

## 2018-01-20 DIAGNOSIS — C187 Malignant neoplasm of sigmoid colon: Secondary | ICD-10-CM

## 2018-01-20 DIAGNOSIS — G629 Polyneuropathy, unspecified: Secondary | ICD-10-CM | POA: Diagnosis not present

## 2018-01-20 DIAGNOSIS — Z79899 Other long term (current) drug therapy: Secondary | ICD-10-CM

## 2018-01-20 DIAGNOSIS — Z923 Personal history of irradiation: Secondary | ICD-10-CM

## 2018-01-20 DIAGNOSIS — I10 Essential (primary) hypertension: Secondary | ICD-10-CM | POA: Diagnosis not present

## 2018-01-20 DIAGNOSIS — Z809 Family history of malignant neoplasm, unspecified: Secondary | ICD-10-CM

## 2018-01-20 DIAGNOSIS — E7800 Pure hypercholesterolemia, unspecified: Secondary | ICD-10-CM

## 2018-01-20 DIAGNOSIS — Z85048 Personal history of other malignant neoplasm of rectum, rectosigmoid junction, and anus: Secondary | ICD-10-CM | POA: Diagnosis not present

## 2018-01-20 DIAGNOSIS — Z9221 Personal history of antineoplastic chemotherapy: Secondary | ICD-10-CM

## 2018-01-20 NOTE — Patient Instructions (Signed)
[facility] at [facility] Discharge Instructions  Today you saw Dr. Higgs   Thank you for choosing [facility] at [facility] to provide your oncology and hematology care.  To afford each patient quality time with our provider, please arrive at least 15 minutes before your scheduled appointment time.   If you have a lab appointment with the Cancer Center please come in thru the  Main Entrance and check in at the main information desk  You need to re-schedule your appointment should you arrive 10 or more minutes late.  We strive to give you quality time with our providers, and arriving late affects you and other patients whose appointments are after yours.  Also, if you no show three or more times for appointments you may be dismissed from the clinic at the providers discretion.     Again, thank you for choosing [facility].  Our hope is that these requests will decrease the amount of time that you wait before being seen by our physicians.       _____________________________________________________________  Should you have questions after your visit to  Cancer Center, please contact our office at [phone] between the hours of 8:00 a.m. and 4:30 p.m.  Voicemails left after 4:00 p.m. will not be returned until the following business day.  For prescription refill requests, have your pharmacy contact our office and allow 72 hours.    Cancer Center Support Programs:   > Cancer Support Group  2nd Tuesday of the month 1pm-2pm, Journey Room   

## 2018-01-20 NOTE — Progress Notes (Signed)
Diagnosis Adenocarcinoma of sigmoid colon ([place]) - Plan: CBC with Differential/Platelet, Comprehensive metabolic panel, Ferritin, CEA  Staging Cancer Staging Adenocarcinoma of sigmoid colon ([place]) Staging form: Colon and Rectum, AJCC 7th Edition - Clinical: Stage IIIC (T4b, N1, M0) - Signed by Baird Cancer, PA on 11/26/2011   Assessment and Plan: 1.   Adenocarcinoma of sigmoid colon ([place]).  72 yr old male previously followed by Dr. 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 Status post resection by Dr. Romona Curls and Dr. Michela Pitcher on 07/25/2005 followed by combination chemotherapy and radiation, followed by oxaliplatin-based chemotherapy in addition to bevacizumab.   Thus far, he remains disease-free. Last CT scan on 06/29/2010 of chest, abdomen, and pelvis demonstrated NED.  Last colonoscopy by Dr. Laural Golden on 02/29/2016 demonstrating diverticulosis and diversion colitis.  He is recommended for colonoscopy in 5 yrs which would be 2022.   Pt is doing well today.  He denies any weight loss, blood in stool, constipation, abdominal pain, diarrhea, cough, SOB.   Labs done 01/16/2018 reviewed with pt and showed WBC 7, HB 15.8 plts 172,000.  Chemistries WNL.  Cr 0.80, LFTs WNL.  Cea 1.3.  Pt desires to continue yearly follow-up.  He will RTC in 12/2018 for follow-up and repeat labs.  He is advised to notify the office if he has any problems prior to his next visit.    2.  HTN.  BP is 122/72.  Follow-up with PCP as recommended.    3.  Neuropathy.  Pt reports this is steadily improving.  Will continue to monitor on follow-up.    4  Health maintenance.  Pt should follow-up with GI as recommended.    Interval History: Historical data obtained from the note dated 01/22/2017.   72 y.o. male previously followed by Dr. 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 Status post resection by Dr. Romona Curls and Dr. Michela Pitcher on 07/25/2005 followed by combination chemotherapy and radiation, followed by oxaliplatin-based chemotherapy in addition to bevacizumab.   Thus far, he remains disease-free. Last CT scan on 06/29/2010 of chest, abdomen, and pelvis demonstrated NED.  Last colonoscopy by Dr. Laural Golden on 02/29/2016 demonstrating diverticulosis and diversion colitis.  Current Status:  Pt is seen today for follow-up.  He is here to go over labs.     Problem List Patient Active Problem List   Diagnosis Date Noted  . Adenocarcinoma of sigmoid colon ([place]) [C18.7] 06/11/2011    Past Medical History Past Medical History:  Diagnosis Date  . Adenocarcinoma of sigmoid colon ([place]) 06/11/2011  . High cholesterol   . Hypertension     Past Surgical History Past Surgical History:  Procedure Laterality Date  . COLON SURGERY    . COLONOSCOPY  07/10/2012   Procedure: COLONOSCOPY;  Surgeon: Rogene Houston, MD;  [facility];  Service: Endoscopy;  Laterality: N/A;  1200  . COLONOSCOPY N/A 02/29/2016   Procedure: COLONOSCOPY;  Surgeon: Rogene Houston, MD;  [facility];  Service: Endoscopy;  Laterality: N/A;  1200 - moved to 9/6 @ 10:30    Family History Family History  Problem Relation Age of Onset  . Cancer Father      Social History  reports that he has quit smoking. He has quit using smokeless tobacco. He reports that he drinks alcohol. He reports that he does not use drugs.  Medications  Current Outpatient Medications:  .  amLODipine (NORVASC) 10 MG tablet, Take 10 mg by mouth daily., Disp: , Rfl:  .  Cholecalciferol (VITAMIN D-3 PO), Take 125 Units by mouth daily., Disp: , Rfl:  .  lisinopril (PRINIVIL,ZESTRIL) 10 MG tablet, Take 10 mg by mouth daily., Disp: , Rfl:  .  meloxicam (MOBIC) 7.5 MG tablet, Take 7.5 mg by mouth daily. , Disp: , Rfl:  .  naproxen (NAPROSYN) 500 MG tablet, , Disp: , Rfl:  .  simvastatin (ZOCOR) 40 MG  tablet, Take 40 mg by mouth daily., Disp: , Rfl:   Allergies Patient has no known allergies.  Review of Systems Review of Systems - Oncology ROS negative other than minimal neuropathy.    Physical Exam  Vitals Wt Readings from Last 3 Encounters:  01/20/18 167 lb 12.8 oz (76.1 kg)  01/22/17 163 lb (73.9 kg)  02/29/16 171 lb (77.6 kg)   Temp Readings from Last 3 Encounters:  01/20/18 98.2 F (36.8 C) (Oral)  02/29/16 98.2 F (36.8 C) (Oral)  01/17/16 98.3 F (36.8 C) (Oral)   BP Readings from Last 3 Encounters:  01/20/18 (!) 141/76  02/29/16 112/78  01/17/16 134/75   Pulse Readings from Last 3 Encounters:  01/20/18 84  01/22/17 74  02/29/16 98   Constitutional: Well-developed, well-nourished, and in no distress.   HENT: Head: Normocephalic and atraumatic.  Mouth/Throat: No oropharyngeal exudate. Mucosa moist. Eyes: Pupils are equal, round, and reactive to light. Conjunctivae are normal. No scleral icterus.  Neck: Normal range of motion. Neck supple. No JVD present.  Cardiovascular: Normal rate, regular rhythm and normal heart sounds.  Exam reveals no gallop and no friction rub.   No murmur heard. Pulmonary/Chest: Effort normal and breath sounds normal. No respiratory distress. No wheezes.No rales.  Abdominal: Soft. Bowel sounds are normal. No distension. There is no tenderness. There is no guarding.  Musculoskeletal: No edema or tenderness.  Lymphadenopathy: No cervical, axillary or supraclavicular adenopathy.  Neurological: Alert and oriented to person, place, and time. No cranial nerve deficit.  Skin: Skin is warm and dry. No rash noted. No erythema. No pallor.  Psychiatric: Affect and judgment normal.   Labs No visits with results within 3 Day(s) from this visit.  Latest known visit with results is:  Appointment on 01/16/2018  Component Date Value Ref Range Status  . WBC 01/16/2018 7.0  4.0 - 10.5 K/uL Final  . RBC 01/16/2018 4.99  4.22 - 5.81 MIL/uL Final   . Hemoglobin 01/16/2018 15.8  13.0 - 17.0 g/dL Final  . HCT 01/16/2018 45.4  39.0 - 52.0 % Final  . MCV 01/16/2018 91.0  78.0 - 100.0 fL Final  . MCH 01/16/2018 31.7  26.0 - 34.0 pg Final  . MCHC 01/16/2018 34.8  30.0 - 36.0 g/dL Final  . RDW 01/16/2018 12.9  11.5 - 15.5 % Final  . Platelets 01/16/2018 172  150 - 400 K/uL Final  . Neutrophils Relative % 01/16/2018 62  % Final  . Neutro Abs 01/16/2018 4.4  1.7 - 7.7 K/uL Final  . Lymphocytes Relative 01/16/2018 26  % Final  . Lymphs Abs 01/16/2018 1.8  0.7 - 4.0 K/uL Final  . Monocytes Relative 01/16/2018 10  % Final  . Monocytes Absolute 01/16/2018 0.7  0.1 - 1.0 K/uL Final  . Eosinophils Relative 01/16/2018 2  % Final  . Eosinophils Absolute 01/16/2018 0.1  0.0 - 0.7 K/uL Final  . Basophils Relative 01/16/2018 0  % Final  . Basophils  Absolute 01/16/2018 0.0  0.0 - 0.1 K/uL Final   Performed at [facility], [address] [place], [place] [address]  . Sodium 01/16/2018 139  135 - 145 mmol/L Final  . Potassium 01/16/2018 4.4  3.5 - 5.1 mmol/L Final  . Chloride 01/16/2018 104  98 - 111 mmol/L Final  . CO2 01/16/2018 26  22 - 32 mmol/L Final  . Glucose, Bld 01/16/2018 94  70 - 99 mg/dL Final  . BUN 01/16/2018 14  8 - 23 mg/dL Final  . Creatinine, Ser 01/16/2018 0.80  0.61 - 1.24 mg/dL Final  . Calcium 01/16/2018 9.5  8.9 - 10.3 mg/dL Final  . Total Protein 01/16/2018 7.4  6.5 - 8.1 g/dL Final  . Albumin 01/16/2018 4.5  3.5 - 5.0 g/dL Final  . AST 01/16/2018 23  15 - 41 U/L Final  . ALT 01/16/2018 26  0 - 44 U/L Final  . Alkaline Phosphatase 01/16/2018 77  38 - 126 U/L Final  . Total Bilirubin 01/16/2018 0.9  0.3 - 1.2 mg/dL Final  . GFR calc non Af Amer 01/16/2018 >60  >60 mL/min Final  . GFR calc Af Amer 01/16/2018 >60  >60 mL/min Final   Comment: (NOTE) The eGFR has been calculated using the CKD EPI equation. This calculation has not been validated in all clinical situations. eGFR's persistently <60 mL/min signify possible  Chronic Kidney Disease.   Georgiann Hahn gap 01/16/2018 9  5 - 15 Final   Performed at [facility], [address] [place], [place] [address]  . CEA 01/16/2018 1.3  0.0 - 4.7 ng/mL Final   Comment: (NOTE)                             Nonsmokers          <3.9                             Smokers             <5.6 [employer] Electrochemiluminescence Immunoassay (ECLIA) Values obtained with different assay methods or kits cannot be used interchangeably.  Results cannot be interpreted as absolute evidence of the presence or absence of malignant disease. Performed At: [facility] [place], [place] [id number] Rush Farmer MD [id number]      Pathology Orders Placed This Encounter  Procedures  . CBC with Differential/Platelet    Standing Status:   Future    Standing Expiration Date:   01/21/2020  . Comprehensive metabolic panel    Standing Status:   Future    Standing Expiration Date:   01/21/2020  . Ferritin    Standing Status:   Future    Standing Expiration Date:   01/21/2020  . CEA    Standing Status:   Future    Standing Expiration Date:   01/21/2020       Zoila Shutter MD

## 2018-05-02 ENCOUNTER — Ambulatory Visit (INDEPENDENT_AMBULATORY_CARE_PROVIDER_SITE_OTHER): Payer: Medicare Other | Admitting: Urology

## 2018-05-02 DIAGNOSIS — N400 Enlarged prostate without lower urinary tract symptoms: Secondary | ICD-10-CM

## 2018-05-02 DIAGNOSIS — R972 Elevated prostate specific antigen [PSA]: Secondary | ICD-10-CM

## 2019-01-16 ENCOUNTER — Other Ambulatory Visit (HOSPITAL_COMMUNITY): Payer: Self-pay | Admitting: *Deleted

## 2019-01-16 DIAGNOSIS — C187 Malignant neoplasm of sigmoid colon: Secondary | ICD-10-CM

## 2019-01-19 ENCOUNTER — Other Ambulatory Visit: Payer: Self-pay

## 2019-01-19 ENCOUNTER — Inpatient Hospital Stay (HOSPITAL_COMMUNITY): Payer: Medicare HMO | Attending: Hematology

## 2019-01-19 DIAGNOSIS — C187 Malignant neoplasm of sigmoid colon: Secondary | ICD-10-CM | POA: Diagnosis present

## 2019-01-19 LAB — CBC WITH DIFFERENTIAL/PLATELET
Abs Immature Granulocytes: 0.03 10*3/uL (ref 0.00–0.07)
Basophils Absolute: 0 10*3/uL (ref 0.0–0.1)
Basophils Relative: 0 %
Eosinophils Absolute: 0.1 10*3/uL (ref 0.0–0.5)
Eosinophils Relative: 2 %
HCT: 48.7 % (ref 39.0–52.0)
Hemoglobin: 16.2 g/dL (ref 13.0–17.0)
Immature Granulocytes: 1 %
Lymphocytes Relative: 26 %
Lymphs Abs: 1.7 10*3/uL (ref 0.7–4.0)
MCH: 30.6 pg (ref 26.0–34.0)
MCHC: 33.3 g/dL (ref 30.0–36.0)
MCV: 92.1 fL (ref 80.0–100.0)
Monocytes Absolute: 0.7 10*3/uL (ref 0.1–1.0)
Monocytes Relative: 11 %
Neutro Abs: 4 10*3/uL (ref 1.7–7.7)
Neutrophils Relative %: 60 %
Platelets: 185 10*3/uL (ref 150–400)
RBC: 5.29 MIL/uL (ref 4.22–5.81)
RDW: 12.7 % (ref 11.5–15.5)
WBC: 6.6 10*3/uL (ref 4.0–10.5)
nRBC: 0 % (ref 0.0–0.2)

## 2019-01-19 LAB — COMPREHENSIVE METABOLIC PANEL
ALT: 24 U/L (ref 0–44)
AST: 18 U/L (ref 15–41)
Albumin: 4.4 g/dL (ref 3.5–5.0)
Alkaline Phosphatase: 79 U/L (ref 38–126)
Anion gap: 10 (ref 5–15)
BUN: 13 mg/dL (ref 8–23)
CO2: 25 mmol/L (ref 22–32)
Calcium: 9.5 mg/dL (ref 8.9–10.3)
Chloride: 105 mmol/L (ref 98–111)
Creatinine, Ser: 0.85 mg/dL (ref 0.61–1.24)
GFR calc Af Amer: >60 mL/min (ref >60)
GFR calc non Af Amer: >60 mL/min (ref >60)
Glucose, Bld: 99 mg/dL (ref 70–99)
Potassium: 4.3 mmol/L (ref 3.5–5.1)
Sodium: 140 mmol/L (ref 135–145)
Total Bilirubin: 0.7 mg/dL (ref 0.3–1.2)
Total Protein: 7.3 g/dL (ref 6.5–8.1)

## 2019-01-19 LAB — FERRITIN: Ferritin: 69 ng/mL (ref 24–336)

## 2019-01-20 LAB — CEA: CEA: 1.5 ng/mL (ref 0.0–4.7)

## 2019-01-26 ENCOUNTER — Encounter (HOSPITAL_COMMUNITY): Payer: Self-pay | Admitting: Hematology

## 2019-01-26 ENCOUNTER — Inpatient Hospital Stay (HOSPITAL_COMMUNITY): Payer: Medicare HMO | Attending: Hematology | Admitting: Hematology

## 2019-01-26 ENCOUNTER — Other Ambulatory Visit: Payer: Self-pay

## 2019-01-26 DIAGNOSIS — Z85038 Personal history of other malignant neoplasm of large intestine: Secondary | ICD-10-CM | POA: Insufficient documentation

## 2019-01-26 DIAGNOSIS — C187 Malignant neoplasm of sigmoid colon: Secondary | ICD-10-CM

## 2019-01-26 DIAGNOSIS — Z79899 Other long term (current) drug therapy: Secondary | ICD-10-CM | POA: Insufficient documentation

## 2019-01-26 DIAGNOSIS — R5383 Other fatigue: Secondary | ICD-10-CM | POA: Diagnosis not present

## 2019-01-26 DIAGNOSIS — E7800 Pure hypercholesterolemia, unspecified: Secondary | ICD-10-CM | POA: Diagnosis not present

## 2019-01-26 DIAGNOSIS — Z9221 Personal history of antineoplastic chemotherapy: Secondary | ICD-10-CM | POA: Insufficient documentation

## 2019-01-26 DIAGNOSIS — I10 Essential (primary) hypertension: Secondary | ICD-10-CM | POA: Insufficient documentation

## 2019-01-26 DIAGNOSIS — Z923 Personal history of irradiation: Secondary | ICD-10-CM | POA: Diagnosis not present

## 2019-01-26 DIAGNOSIS — Z87891 Personal history of nicotine dependence: Secondary | ICD-10-CM | POA: Diagnosis not present

## 2019-01-26 DIAGNOSIS — M2550 Pain in unspecified joint: Secondary | ICD-10-CM | POA: Diagnosis not present

## 2019-01-26 NOTE — Assessment & Plan Note (Signed)
1. 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  - Status post resection by Dr. Romona Curls and Dr. Michela Pitcher on 07/25/2005  - Resection was followed by combination chemotherapy and radiation, followed by oxaliplatin-based chemotherapy in addition to bevacizumab.    - Last CT scan on 06/29/2010 of chest, abdomen, and pelvis demonstrated NED.  - Last colonoscopy  on 02/29/2016 demonstrating diverticulosis and diversion colitis. He was recommended for repeat colonoscopy in 5 years, 2022.  -Labs today are stable no evidence of anemia.  CEA is 1.5.  Denies any blood per rectum or change in bowel habits. -He will continue with active surveillance. -Return to clinic in 1 year with labs.

## 2019-01-26 NOTE — Progress Notes (Signed)
[address],  [address]   CLINIC:  Medical Oncology/Hematology  PCP:  Lucia Gaskins, MD [place] [address] [phone]   REASON FOR VISIT:  Follow-up for Colon Cancer  CURRENT THERAPY: Clinical surveillance   CANCER STAGING: Cancer Staging Adenocarcinoma of sigmoid colon [facility]) Staging form: Colon and Rectum, AJCC 7th Edition - Clinical: Stage IIIC (T4b, N1, M0) - Signed by Baird Cancer, PA on 11/26/2011    INTERVAL HISTORY:  Walter Orozco 73 y.o. male presents today for follow-up of history of colon cancer.  Patient was originally diagnosed in 2007 status post resection and chemo and radiation.  He denies any significant fatigue.  He denies any change in bowel habits.  No blood per rectum.  No weight loss.  He states overall he is doing well.  He has had no recent hospitalizations.   REVIEW OF SYSTEMS:  Review of Systems  Musculoskeletal: Positive for arthralgias.  All other systems reviewed and are negative.    PAST MEDICAL/SURGICAL HISTORY:  Past Medical History:  Diagnosis Date  . Adenocarcinoma of sigmoid colon ([place]) 06/11/2011  . High cholesterol   . Hypertension    Past Surgical History:  Procedure Laterality Date  . COLON SURGERY    . COLONOSCOPY  07/10/2012   Procedure: COLONOSCOPY;  Surgeon: Rogene Houston, MD;  [facility];  Service: Endoscopy;  Laterality: N/A;  1200  . COLONOSCOPY N/A 02/29/2016   Procedure: COLONOSCOPY;  Surgeon: Rogene Houston, MD;  [facility];  Service: Endoscopy;  Laterality: N/A;  1200 - moved to 9/6 @ 10:30     SOCIAL HISTORY:  Social History   Socioeconomic History  . Marital status: Married    Spouse name: Not on file  . Number of children: Not on file  . Years of education: Not on file  . Highest education level: Not on file  Occupational History  . Not on file  Social Needs  . Financial resource strain: Not on file  .  Food insecurity    Worry: Not on file    Inability: Not on file  . Transportation needs    Medical: Not on file    Non-medical: Not on file  Tobacco Use  . Smoking status: Former Research scientist (life sciences)  . Smokeless tobacco: Former Network engineer and Sexual Activity  . Alcohol use: Yes    Comment: occasional glass of wine or beer  . Drug use: No  . Sexual activity: Not on file  Lifestyle  . Physical activity    Days per week: Not on file    Minutes per session: Not on file  . Stress: Not on file  Relationships  . Social Herbalist on phone: Not on file    Gets together: Not on file    Attends religious service: Not on file    Active member of club or organization: Not on file    Attends meetings of clubs or organizations: Not on file    Relationship status: Not on file  . Intimate partner violence    Fear of current or ex partner: Not on file    Emotionally abused: Not on file    Physically abused: Not on file    Forced sexual activity: Not on file  Other Topics Concern  . Not on file  Social History Narrative  . Not on file    FAMILY HISTORY:  Family History  Problem  Relation Age of Onset  . Cancer Father     CURRENT MEDICATIONS:  Outpatient Encounter Medications as of 01/26/2019  Medication Sig  . amLODipine (NORVASC) 10 MG tablet Take 10 mg by mouth daily.  . Cholecalciferol (VITAMIN D-3 PO) Take 125 Units by mouth daily.  Marland Kitchen lisinopril (PRINIVIL,ZESTRIL) 10 MG tablet Take 10 mg by mouth daily.  . simvastatin (ZOCOR) 40 MG tablet Take 40 mg by mouth daily.  . [DISCONTINUED] meloxicam (MOBIC) 7.5 MG tablet Take 7.5 mg by mouth daily.   . [DISCONTINUED] naproxen (NAPROSYN) 500 MG tablet    No facility-administered encounter medications on file as of 01/26/2019.     ALLERGIES:  No Known Allergies   PHYSICAL EXAM:  ECOG Performance status: 1  Vitals:   01/26/19 1000  BP: 127/66  Pulse: 74  Resp: 16  Temp: 98.6 F (37 C)  SpO2: 99%   Filed Weights    01/26/19 1000  Weight: 176 lb 5 oz (80 kg)    Physical Exam Constitutional:      Appearance: Normal appearance. He is obese.  HENT:     Head: Normocephalic.     Right Ear: External ear normal.     Left Ear: External ear normal.     Nose: Nose normal.     Mouth/Throat:     Mouth: Mucous membranes are dry.  Eyes:     Conjunctiva/sclera: Conjunctivae normal.  Neck:     Musculoskeletal: Normal range of motion.  Cardiovascular:     Rate and Rhythm: Normal rate and regular rhythm.     Pulses: Normal pulses.     Heart sounds: Normal heart sounds.  Pulmonary:     Effort: Pulmonary effort is normal.     Breath sounds: Normal breath sounds.  Abdominal:     General: Bowel sounds are normal.     Palpations: Abdomen is soft.  Musculoskeletal: Normal range of motion.  Skin:    General: Skin is warm and dry.  Neurological:     General: No focal deficit present.     Mental Status: He is alert and oriented to person, place, and time.  Psychiatric:        Mood and Affect: Mood normal.        Behavior: Behavior normal.        Thought Content: Thought content normal.        Judgment: Judgment normal.      LABORATORY DATA:  I have reviewed the labs as listed.  CBC    Component Value Date/Time   WBC 6.6 01/19/2019 1030   RBC 5.29 01/19/2019 1030   HGB 16.2 01/19/2019 1030   HCT 48.7 01/19/2019 1030   PLT 185 01/19/2019 1030   MCV 92.1 01/19/2019 1030   MCH 30.6 01/19/2019 1030   MCHC 33.3 01/19/2019 1030   RDW 12.7 01/19/2019 1030   LYMPHSABS 1.7 01/19/2019 1030   MONOABS 0.7 01/19/2019 1030   EOSABS 0.1 01/19/2019 1030   BASOSABS 0.0 01/19/2019 1030   CMP Latest Ref Rng & Units 01/19/2019 01/16/2018 01/22/2017  Glucose 70 - 99 mg/dL 99 94 116(H)  BUN 8 - 23 mg/dL 13 14 14   Creatinine 0.61 - 1.24 mg/dL 0.85 0.80 0.78  Sodium 135 - 145 mmol/L 140 139 139  Potassium 3.5 - 5.1 mmol/L 4.3 4.4 3.9  Chloride 98 - 111 mmol/L 105 104 106  CO2 22 - 32 mmol/L 25 26 26   Calcium 8.9  - 10.3 mg/dL 9.5 9.5 9.4  Total Protein  6.5 - 8.1 g/dL 7.3 7.4 7.3  Total Bilirubin 0.3 - 1.2 mg/dL 0.7 0.9 0.9  Alkaline Phos 38 - 126 U/L 79 77 71  AST 15 - 41 U/L 18 23 25   ALT 0 - 44 U/L 24 26 25        ASSESSMENT & PLAN:   Adenocarcinoma of sigmoid colon ([facility]) 1. 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  - Status post resection by Dr. Romona Curls and Dr. Michela Pitcher on 07/25/2005  - Resection was followed by combination chemotherapy and radiation, followed by oxaliplatin-based chemotherapy in addition to bevacizumab.    - Last CT scan on 06/29/2010 of chest, abdomen, and pelvis demonstrated NED.  - Last colonoscopy  on 02/29/2016 demonstrating diverticulosis and diversion colitis. He was recommended for repeat colonoscopy in 5 years, 2022.  -Labs today are stable no evidence of anemia.  CEA is 1.5.  Denies any blood per rectum or change in bowel habits. -He will continue with active surveillance. -Return to clinic in 1 year with labs.      Orders placed this encounter:  Orders Placed This Encounter  Procedures  . CBC with Differential  . Comprehensive metabolic panel  . CEA  . Ferritin  . Iron and TIBC      Walter Orozco, [place] [phone]

## 2019-04-24 IMAGING — DX DG SHOULDER 2+V*L*
3 series · 3 of 3 positions shown · non-contrast
Comparison: No recent prior.

CLINICAL DATA: Shoulder pain.

EXAM:
LEFT SHOULDER - 2+ VIEW

[shoulder grashey]
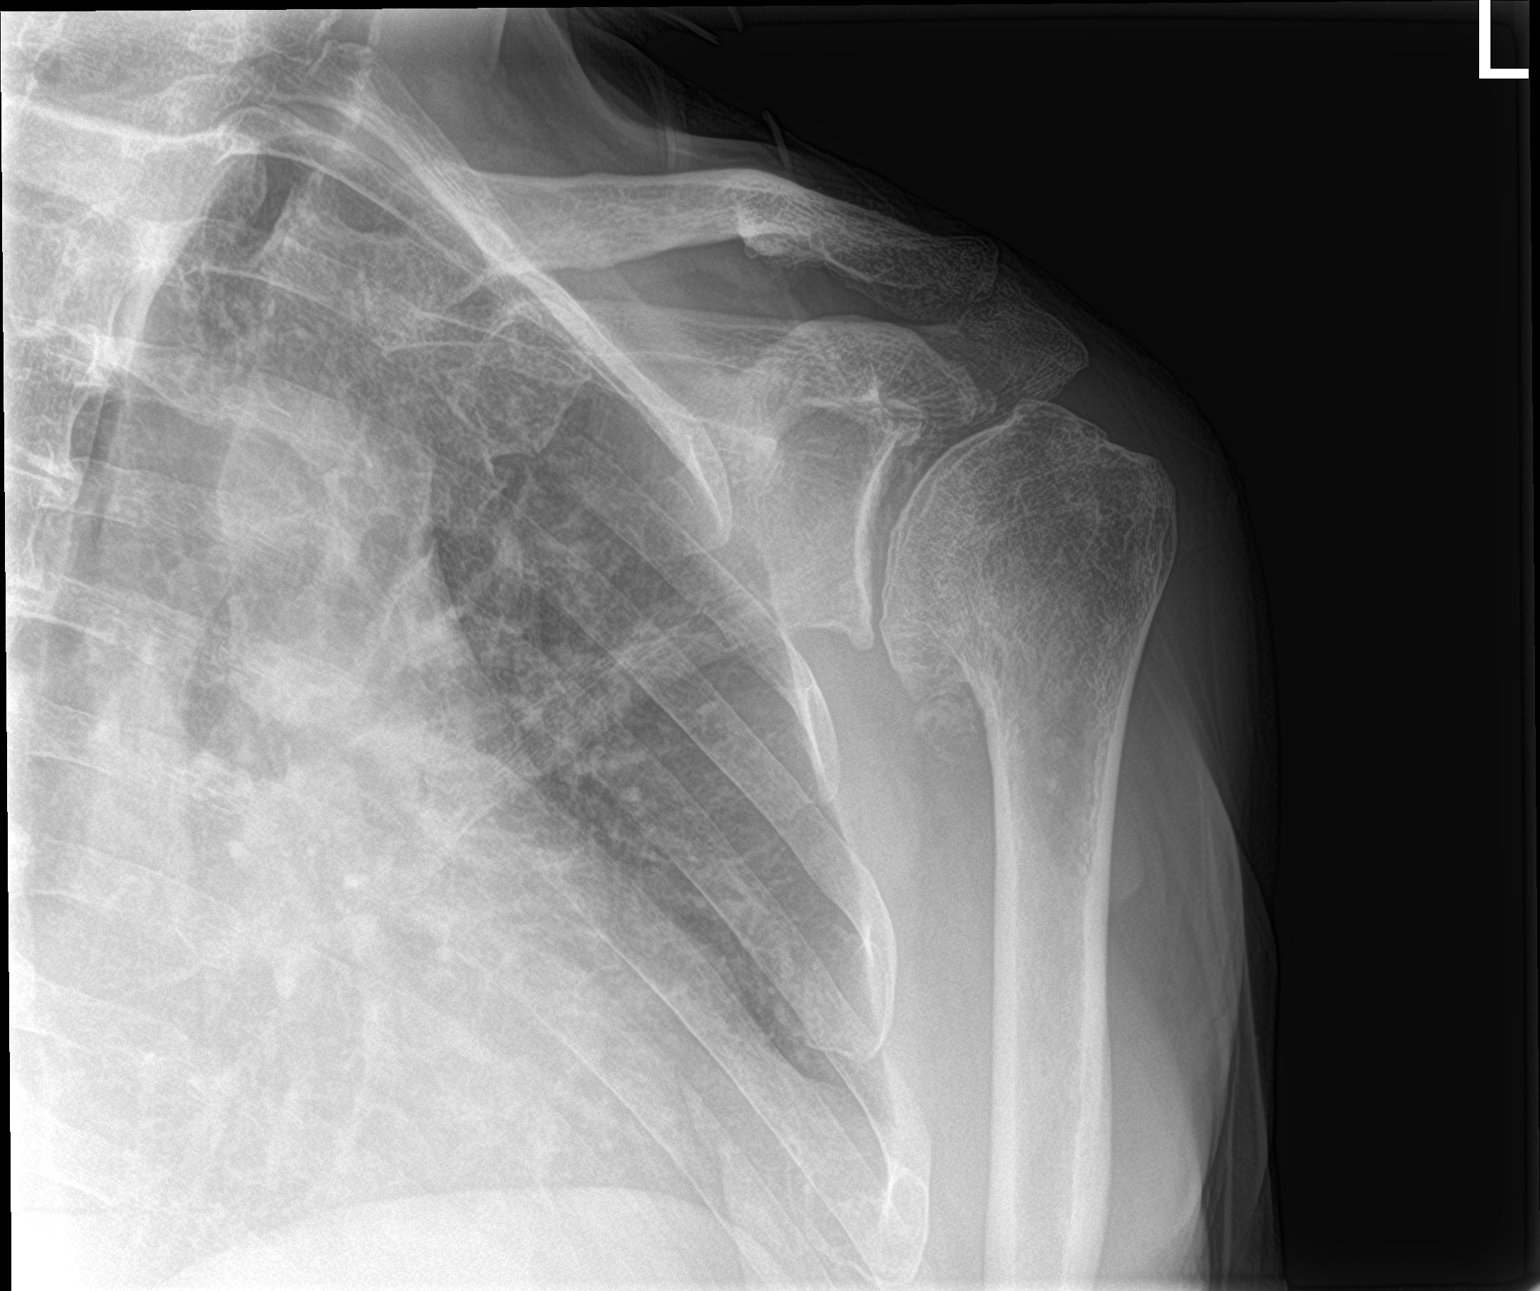

[shoulder y view]
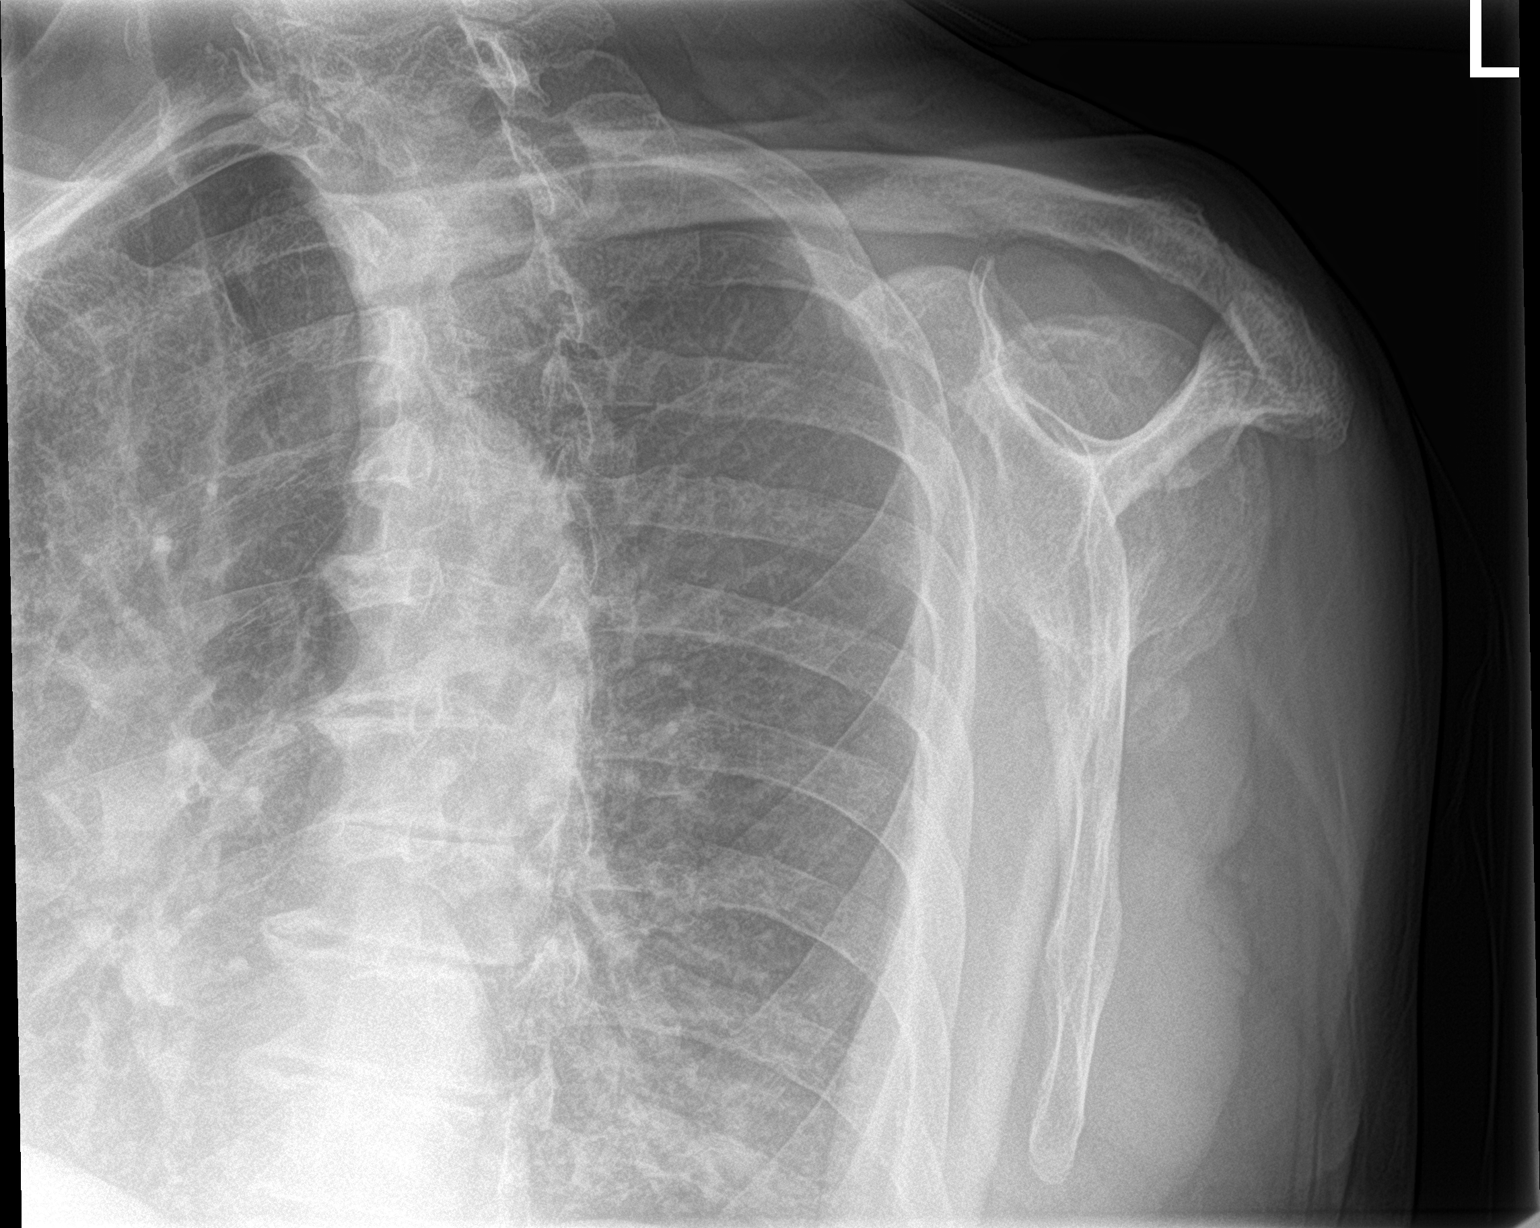

[shoulder axillary]
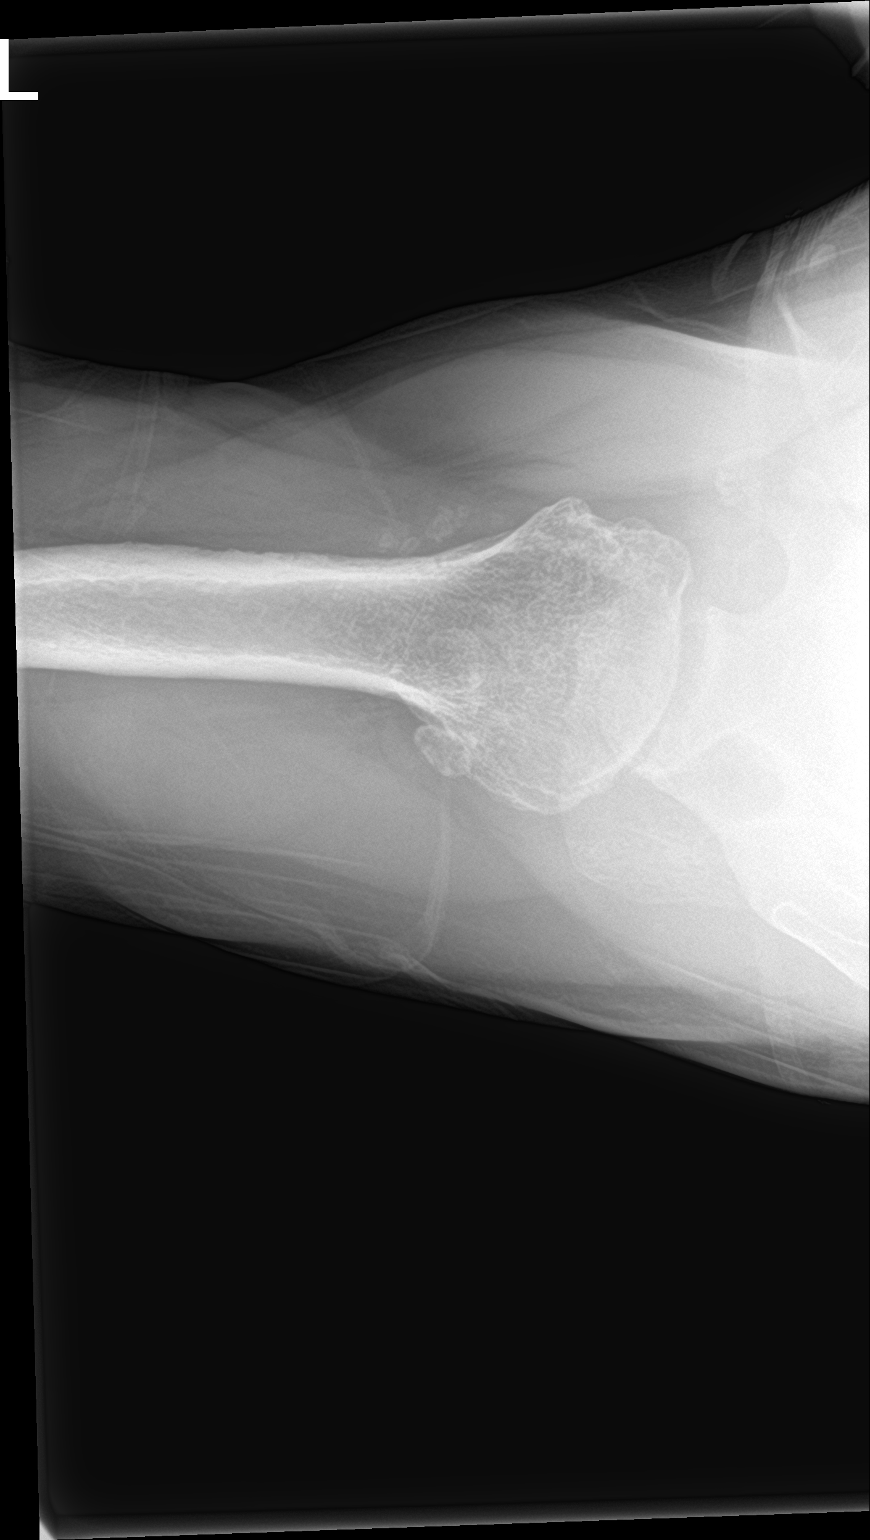

[3 of 3 positions shown; findings below may reference images not displayed]

FINDINGS: Acromioclavicular and glenohumeral degenerative change. Corticated
rounded density noted adjacent to the medial aspect of the eft
shoulder may represent old fracture fragment or large loose body .
No evidence of fracture, dislocation, or separation .
IMPRESSION: Degenerative changes left shoulder. Corticated bony density noted
along the medial aspect of the left shoulder, possibly an old
fracture fragment or loose body. Diffuse osteopenia. No acute
abnormality .

## 2019-05-18 NOTE — H&P (Signed)
TOTAL HIP ADMISSION H&P  Patient is admitted for right total hip arthroplasty, anterior approach.  Subjective:  Chief Complaint: Right hip primary OA / pain  HPI: Walter Orozco, 73 y.o. male, has a history of pain and functional disability in the right hip(s) due to arthritis and patient has failed non-surgical conservative treatments for greater than 12 weeks to include NSAID's and/or analgesics and activity modification.  Onset of symptoms was gradual starting 1+ years ago with gradually worsening course since that time.The patient noted no past surgery on the right hip(s).  Patient currently rates pain in the right hip at 7 out of 10 with activity. Patient has night pain, worsening of pain with activity and weight bearing, trendelenberg gait, pain that interfers with activities of daily living and pain with passive range of motion. Patient has evidence of periarticular osteophytes and joint space narrowing by imaging studies. This condition presents safety issues increasing the risk of falls.  There is no current active infection.  Risks, benefits and expectations were discussed with the patient.  Risks including but not limited to the risk of anesthesia, blood clots, nerve damage, blood vessel damage, failure of the prosthesis, infection and up to and including death.  Patient understand the risks, benefits and expectations and wishes to proceed with surgery.   PCP: Lucia Gaskins, MD  D/C Plans:       Home (same day is possible)  Post-op Meds:       No Rx given   Tranexamic Acid:      To be given - IV   Decadron:      Is to be given  FYI:      ASA  Norco  DME:   Pt already has equipment   PT:   HEP  Pharmacy: [place], [place]    Patient Active Problem List   Diagnosis Date Noted  . Adenocarcinoma of sigmoid colon ([place]) 06/11/2011   Past Medical History:  Diagnosis Date  . Adenocarcinoma of sigmoid colon ([place]) 06/11/2011  . High cholesterol   . Hypertension      Past Surgical History:  Procedure Laterality Date  . COLON SURGERY    . COLONOSCOPY  07/10/2012   Procedure: COLONOSCOPY;  Surgeon: Rogene Houston, MD;  [facility];  Service: Endoscopy;  Laterality: N/A;  1200  . COLONOSCOPY N/A 02/29/2016   Procedure: COLONOSCOPY;  Surgeon: Rogene Houston, MD;  Location: [facility];  Service: Endoscopy;  Laterality: N/A;  1200 - moved to 9/6 @ 10:30    No current facility-administered medications for this encounter.    Current Outpatient Medications  Medication Sig Dispense Refill Last Dose  . amLODipine (NORVASC) 10 MG tablet Take 10 mg by mouth daily.   Taking  . Cholecalciferol (VITAMIN D-3 PO) Take 125 Units by mouth daily.   Taking  . lisinopril (PRINIVIL,ZESTRIL) 10 MG tablet Take 10 mg by mouth daily.   Taking  . simvastatin (ZOCOR) 40 MG tablet Take 40 mg by mouth daily.   Taking   No Known Allergies   Social History   Tobacco Use  . Smoking status: Former Research scientist (life sciences)  . Smokeless tobacco: Former Network engineer Use Topics  . Alcohol use: Yes    Comment: occasional glass of wine or beer    Family History  Problem Relation Age of Onset  . Cancer Father      Review of Systems  Constitutional: Negative.   HENT: Negative.   Eyes: Negative.  Respiratory: Positive for cough (ACE-I).   Cardiovascular: Negative.   Gastrointestinal: Negative.   Genitourinary: Negative.   Musculoskeletal: Positive for joint pain.  Skin: Negative.   Neurological: Negative.   Endo/Heme/Allergies: Negative.   Psychiatric/Behavioral: Negative.     Objective:  Physical Exam  Constitutional: He is oriented to person, place, and time. He appears well-developed.  HENT:  Head: Normocephalic.  Eyes: Pupils are equal, round, and reactive to light.  Neck: Neck supple. No JVD present. No tracheal deviation present. No thyromegaly present.  Cardiovascular: Normal rate, regular rhythm and intact distal pulses.  Respiratory: Effort normal and  breath sounds normal. No respiratory distress. He has no wheezes.  GI: Soft. There is no abdominal tenderness. There is no guarding.  Musculoskeletal:     Right hip: He exhibits decreased range of motion, decreased strength, tenderness and bony tenderness. He exhibits no swelling, no deformity and no laceration.  Lymphadenopathy:    He has no cervical adenopathy.  Neurological: He is alert and oriented to person, place, and time. A sensory deficit (bilateral LE neuropathy (chemo)) is present.  Skin: Skin is warm and dry.  Psychiatric: He has a normal mood and affect.      Labs:  Estimated body mass index is 28.46 kg/m as calculated from the following:   Height as of 01/22/17: 5\' 6"  (1.676 m).   Weight as of 01/26/19: 80 kg.   Imaging Review Plain radiographs demonstrate severe degenerative joint disease of the right hip(s). The bone quality appears to be good for age and reported activity level.      Assessment/Plan:  End stage arthritis, right hip  The patient history, physical examination, clinical judgement of the provider and imaging studies are consistent with end stage degenerative joint disease of the right hip(s) and total hip arthroplasty is deemed medically necessary. The treatment options including medical management, injection therapy, arthroscopy and arthroplasty were discussed at length. The risks and benefits of total hip arthroplasty were presented and reviewed. The risks due to aseptic loosening, infection, stiffness, dislocation/subluxation,  thromboembolic complications and other imponderables were discussed.  The patient acknowledged the explanation, agreed to proceed with the plan and consent was signed. Patient is being admitted for inpatient treatment for surgery, pain control, PT, OT, prophylactic antibiotics, VTE prophylaxis, progressive ambulation and ADL's and discharge planning.The patient is planning to be discharged home.    [place] Lavar Rosenzweig    PA-C  05/18/2019, 12:48 PM

## 2019-05-27 NOTE — Patient Instructions (Signed)
DUE TO COVID-19 ONLY ONE VISITOR IS ALLOWED TO COME WITH YOU AND STAY IN THE WAITING ROOM ONLY DURING PRE OP AND PROCEDURE DAY OF SURGERY. THE 1 VISITOR MAY VISIT WITH YOU AFTER SURGERY IN YOUR PRIVATE ROOM DURING VISITING HOURS ONLY!  YOU NEED TO HAVE A COVID 19 TEST ON__Friday 12/04/2020_____ @__1215  pm_____, THIS TEST MUST BE DONE BEFORE SURGERY, COME  [address], [name] , [id number].  ([place]) ONCE YOUR COVID TEST IS COMPLETED, PLEASE BEGIN THE QUARANTINE INSTRUCTIONS AS OUTLINED IN YOUR HANDOUT.                Walter Orozco     Your procedure is scheduled on: Tuesday 06/02/2019   Report to [facility] Main  Entrance    Report to admitting at   0900 AM     Call this number if you have problems the morning of surgery [phone]    Remember: Do not eat food  :After Midnight.    NO SOLID FOOD AFTER MIDNIGHT THE NIGHT PRIOR TO SURGERY. NOTHING BY MOUTH EXCEPT CLEAR LIQUIDS UNTIL  0830 am .     PLEASE FINISH ENSURE DRINK PER SURGEON ORDER  WHICH NEEDS TO BE COMPLETED AT  0830 am .   CLEAR LIQUID DIET   Foods Allowed                                                                     Foods Excluded  Coffee and tea, regular and decaf                             liquids that you cannot  Plain Jell-O any favor except red or purple                                           see through such as: Fruit ices (not with fruit pulp)                                     milk, soups, orange juice  Iced Popsicles                                    All solid food Carbonated beverages, regular and diet                                    Cranberry, grape and apple juices Sports drinks like Gatorade Lightly seasoned clear broth or consume(fat free) Sugar, honey syrup  Sample Menu Breakfast                                Lunch  Supper Cranberry juice                    Beef broth                            Chicken  broth Jell-O                                     Grape juice                           Apple juice Coffee or tea                        Jell-O                                      Popsicle                                                Coffee or tea                        Coffee or tea  _____________________________________________________________________      BRUSH YOUR TEETH MORNING OF SURGERY AND RINSE YOUR MOUTH OUT, NO CHEWING GUM CANDY OR MINTS.     Take these medicines the morning of surgery with A SIP OF WATER: Amlodipine (Norvasc), Simvastatin (Zocor)                                 You may not have any metal on your body including hair pins and              piercings  Do not wear jewelry, make-up, lotions, powders or perfumes, deodorant                          Men may shave face and neck.   Do not bring valuables to the hospital. [place].  Contacts, dentures or bridgework may not be worn into surgery.  Leave suitcase in the car. After surgery it may be brought to your room.                Please read over the following fact sheets you were given: _____________________________________________________________________             [facility] - Preparing for Surgery Before surgery, you can play an important role.  Because skin is not sterile, your skin needs to be as free of germs as possible.  You can reduce the number of germs on your skin by washing with CHG (chlorahexidine gluconate) soap before surgery.  CHG is an antiseptic cleaner which kills germs and bonds with the skin to continue killing germs even after washing. Please DO NOT use if you have an allergy to CHG or antibacterial soaps.  If your skin becomes reddened/irritated stop using the CHG and inform your  nurse when you arrive at Short Stay. Do not shave (including legs and underarms) for at least 48 hours prior to the first CHG shower.  You may shave your  face/neck. Please follow these instructions carefully:  1.  Shower with CHG Soap the night before surgery and the  morning of Surgery.  2.  If you choose to wash your hair, wash your hair first as usual with your  normal  shampoo.  3.  After you shampoo, rinse your hair and body thoroughly to remove the  shampoo.                           4.  Use CHG as you would any other liquid soap.  You can apply chg directly  to the skin and wash                       Gently with a scrungie or clean washcloth.  5.  Apply the CHG Soap to your body ONLY FROM THE NECK DOWN.   Do not use on face/ open                           Wound or open sores. Avoid contact with eyes, ears mouth and genitals (private parts).                       Wash face,  Genitals (private parts) with your normal soap.             6.  Wash thoroughly, paying special attention to the area where your surgery  will be performed.  7.  Thoroughly rinse your body with warm water from the neck down.  8.  DO NOT shower/wash with your normal soap after using and rinsing off  the CHG Soap.                9.  Pat yourself dry with a clean towel.            10.  Wear clean pajamas.            11.  Place clean sheets on your bed the night of your first shower and do not  sleep with pets. Day of Surgery : Do not apply any lotions/deodorants the morning of surgery.  Please wear clean clothes to the hospital/surgery center.  FAILURE TO FOLLOW THESE INSTRUCTIONS MAY RESULT IN THE CANCELLATION OF YOUR SURGERY PATIENT SIGNATURE_________________________________  NURSE SIGNATURE__________________________________  ________________________________________________________________________   Walter Orozco  An incentive spirometer is a tool that can help keep your lungs clear and active. This tool measures how well you are filling your lungs with each breath. Taking long deep breaths may help reverse or decrease the chance of developing breathing  (pulmonary) problems (especially infection) following:  A long period of time when you are unable to move or be active. BEFORE THE PROCEDURE   If the spirometer includes an indicator to show your best effort, your nurse or respiratory therapist will set it to a desired goal.  If possible, sit up straight or lean slightly forward. Try not to slouch.  Hold the incentive spirometer in an upright position. INSTRUCTIONS FOR USE  1. Sit on the edge of your bed if possible, or sit up as far as you can in bed or on a chair. 2. Hold the incentive spirometer in an upright position. 3.  Breathe out normally. 4. Place the mouthpiece in your mouth and seal your lips tightly around it. 5. Breathe in slowly and as deeply as possible, raising the piston or the ball toward the top of the column. 6. Hold your breath for 3-5 seconds or for as long as possible. Allow the piston or ball to fall to the bottom of the column. 7. Remove the mouthpiece from your mouth and breathe out normally. 8. Rest for a few seconds and repeat Steps 1 through 7 at least 10 times every 1-2 hours when you are awake. Take your time and take a few normal breaths between deep breaths. 9. The spirometer may include an indicator to show your best effort. Use the indicator as a goal to work toward during each repetition. 10. After each set of 10 deep breaths, practice coughing to be sure your lungs are clear. If you have an incision (the cut made at the time of surgery), support your incision when coughing by placing a pillow or rolled up towels firmly against it. Once you are able to get out of bed, walk around indoors and cough well. You may stop using the incentive spirometer when instructed by your caregiver.  RISKS AND COMPLICATIONS  Take your time so you do not get dizzy or light-headed.  If you are in pain, you may need to take or ask for pain medication before doing incentive spirometry. It is harder to take a deep breath if you  are having pain. AFTER USE  Rest and breathe slowly and easily.  It can be helpful to keep track of a log of your progress. Your caregiver can provide you with a simple table to help with this. If you are using the spirometer at home, follow these instructions: [place] IF:   You are having difficultly using the spirometer.  You have trouble using the spirometer as often as instructed.  Your pain medication is not giving enough relief while using the spirometer.  You develop fever of 100.5 F (38.1 C) or higher. SEEK IMMEDIATE MEDICAL CARE IF:   You cough up bloody sputum that had not been present before.  You develop fever of 102 F (38.9 C) or greater.  You develop worsening pain at or near the incision site. MAKE SURE YOU:   Understand these instructions.  Will watch your condition.  Will get help right away if you are not doing well or get worse. Document Released: 10/22/2006 Document Revised: 09/03/2011 Document Reviewed: 12/23/2006 ExitCare Patient Information 2014 Exit[employer], [place].   ________________________________________________________________________  WHAT IS A BLOOD TRANSFUSION? Blood Transfusion Information  A transfusion is the replacement of blood or some of its parts. Blood is made up of multiple cells which provide different functions.  Red blood cells carry oxygen and are used for blood loss replacement.  White blood cells fight against infection.  Platelets control bleeding.  Plasma helps clot blood.  Other blood products are available for specialized needs, such as hemophilia or other clotting disorders. BEFORE THE TRANSFUSION  Who gives blood for transfusions?   Healthy volunteers who are fully evaluated to make sure their blood is safe. This is blood bank blood. Transfusion therapy is the safest it has ever been in the practice of medicine. Before blood is taken from a donor, a complete history is taken to make sure that person has  no history of diseases nor engages in risky social behavior (examples are intravenous drug use or sexual activity with multiple partners). The  donor's travel history is screened to minimize risk of transmitting infections, such as malaria. The donated blood is tested for signs of infectious diseases, such as HIV and hepatitis. The blood is then tested to be sure it is compatible with you in order to minimize the chance of a transfusion reaction. If you or a relative donates blood, this is often done in anticipation of surgery and is not appropriate for emergency situations. It takes many days to process the donated blood. RISKS AND COMPLICATIONS Although transfusion therapy is very safe and saves many lives, the main dangers of transfusion include:   Getting an infectious disease.  Developing a transfusion reaction. This is an allergic reaction to something in the blood you were given. Every precaution is taken to prevent this. The decision to have a blood transfusion has been considered carefully by your caregiver before blood is given. Blood is not given unless the benefits outweigh the risks. AFTER THE TRANSFUSION  Right after receiving a blood transfusion, you will usually feel much better and more energetic. This is especially true if your red blood cells have gotten low (anemic). The transfusion raises the level of the red blood cells which carry oxygen, and this usually causes an energy increase.  The nurse administering the transfusion will monitor you carefully for complications. HOME CARE INSTRUCTIONS  No special instructions are needed after a transfusion. You may find your energy is better. Speak with your caregiver about any limitations on activity for underlying diseases you may have. SEEK MEDICAL CARE IF:   Your condition is not improving after your transfusion.  You develop redness or irritation at the intravenous (IV) site. SEEK IMMEDIATE MEDICAL CARE IF:  Any of the following  symptoms occur over the next 12 hours:  Shaking chills.  You have a temperature by mouth above 102 F (38.9 C), not controlled by medicine.  Chest, back, or muscle pain.  People around you feel you are not acting correctly or are confused.  Shortness of breath or difficulty breathing.  Dizziness and fainting.  You get a rash or develop hives.  You have a decrease in urine output.  Your urine turns a dark color or changes to pink, red, or brown. Any of the following symptoms occur over the next 10 days:  You have a temperature by mouth above 102 F (38.9 C), not controlled by medicine.  Shortness of breath.  Weakness after normal activity.  The white part of the eye turns yellow (jaundice).  You have a decrease in the amount of urine or are urinating less often.  Your urine turns a dark color or changes to pink, red, or brown. Document Released: 06/08/2000 Document Revised: 09/03/2011 Document Reviewed: 01/26/2008 [facility] Patient Information 2014 [place], [place].  _______________________________________________________________________

## 2019-05-29 ENCOUNTER — Encounter (HOSPITAL_COMMUNITY): Payer: Self-pay

## 2019-05-29 ENCOUNTER — Other Ambulatory Visit (HOSPITAL_COMMUNITY)
Admission: RE | Admit: 2019-05-29 | Discharge: 2019-05-29 | Disposition: A | Discharge status: Home / Self Care | Payer: Medicare HMO | Source: Ambulatory Visit | Attending: Orthopedic Surgery | Admitting: Orthopedic Surgery

## 2019-05-29 ENCOUNTER — Other Ambulatory Visit: Payer: Self-pay

## 2019-05-29 ENCOUNTER — Encounter (HOSPITAL_COMMUNITY)
Admission: RE | Admit: 2019-05-29 | Discharge: 2019-05-29 | Disposition: A | Discharge status: Home / Self Care | Payer: Medicare HMO | Source: Ambulatory Visit | Attending: Orthopedic Surgery | Admitting: Orthopedic Surgery

## 2019-05-29 DIAGNOSIS — Z20828 Contact with and (suspected) exposure to other viral communicable diseases: Secondary | ICD-10-CM | POA: Insufficient documentation

## 2019-05-29 DIAGNOSIS — Z01818 Encounter for other preprocedural examination: Secondary | ICD-10-CM | POA: Insufficient documentation

## 2019-05-29 DIAGNOSIS — M1611 Unilateral primary osteoarthritis, right hip: Secondary | ICD-10-CM | POA: Insufficient documentation

## 2019-05-29 HISTORY — DX: Unspecified osteoarthritis, unspecified site: M19.90

## 2019-05-29 LAB — BASIC METABOLIC PANEL
Anion gap: 12 (ref 5–15)
BUN: 12 mg/dL (ref 8–23)
CO2: 24 mmol/L (ref 22–32)
Calcium: 9.8 mg/dL (ref 8.9–10.3)
Chloride: 103 mmol/L (ref 98–111)
Creatinine, Ser: 0.75 mg/dL (ref 0.61–1.24)
GFR calc Af Amer: >60 mL/min (ref >60)
GFR calc non Af Amer: >60 mL/min (ref >60)
Glucose, Bld: 103 mg/dL — ABNORMAL HIGH (ref 70–99)
Potassium: 4.2 mmol/L (ref 3.5–5.1)
Sodium: 139 mmol/L (ref 135–145)

## 2019-05-29 LAB — CBC
HCT: 49 % (ref 39.0–52.0)
Hemoglobin: 16.6 g/dL (ref 13.0–17.0)
MCH: 31 pg (ref 26.0–34.0)
MCHC: 33.9 g/dL (ref 30.0–36.0)
MCV: 91.6 fL (ref 80.0–100.0)
Platelets: 222 10*3/uL (ref 150–400)
RBC: 5.35 MIL/uL (ref 4.22–5.81)
RDW: 12.5 % (ref 11.5–15.5)
WBC: 7.9 10*3/uL (ref 4.0–10.5)
nRBC: 0 % (ref 0.0–0.2)

## 2019-05-29 LAB — SURGICAL PCR SCREEN
MRSA, PCR: NEGATIVE
Staphylococcus aureus: NEGATIVE

## 2019-05-29 NOTE — Progress Notes (Signed)
   05/29/19 1118  OBSTRUCTIVE SLEEP APNEA  Have you ever been diagnosed with sleep apnea through a sleep study? No  Do you snore loudly (loud enough to be heard through closed doors)?  1  Do you often feel tired, fatigued, or sleepy during the daytime (such as falling asleep during driving or talking to someone)? 0  Has anyone observed you stop breathing during your sleep? 1  Do you have, or are you being treated for high blood pressure? 1  BMI more than 35 kg/m2? 0  Age > 50 (1-yes) 1  Neck circumference greater than:Male 16 inches or larger, Male 17inches or larger? 0  Male Gender (Yes=1) 1  Obstructive Sleep Apnea Score 5

## 2019-05-29 NOTE — Progress Notes (Signed)
PCP - Dr. Willy Eddy, [place] Cardiologist -N/A   Chest x-ray -N/A  EKG - 05/29/2019 Stress Test - N/A ECHO - N/A Cardiac Cath - N/A  Sleep Study - N/A CPAP -N/A   Fasting Blood Sugar - N/A Checks Blood Sugar _____ times a day  Blood Thinner Instructions:N/A Aspirin Instructions:N/A Last Dose:N/A  Anesthesia review:   Patient has a history of colon cancer and HTN.  Patient denies shortness of breath, fever, cough and chest pain at PAT appointment   Patient verbalized understanding of instructions that were given to them at the PAT appointment. Patient was also instructed that they will need to review over the PAT instructions again at home before surgery.

## 2019-05-30 LAB — ABO/RH: ABO/RH(D): O POS

## 2019-05-30 LAB — NOVEL CORONAVIRUS, NAA (HOSP ORDER, SEND-OUT TO REF LAB; TAT 18-24 HRS): SARS-CoV-2, NAA: NOT DETECTED

## 2019-06-01 NOTE — Progress Notes (Signed)
Spoke with pt and his wife in regards to arrival time to [facility] admitting at 0550 for schedule procedure on Tuesday 06/02/2019. Pt aware no food after midnight; clear liquids from midnight to 0540 consuming pre surgery drink at 0540 then nothing by mouth.

## 2019-06-01 NOTE — Progress Notes (Signed)
Spoke with patient and instructed patient to arrive at Admitting at 0610 am on 06/02/2019 and to drink Ensure at 0540 am with nothing to drink after ensure until after surgery. Patient verbalized understanding.

## 2019-06-02 ENCOUNTER — Inpatient Hospital Stay (HOSPITAL_COMMUNITY): Payer: Medicare HMO | Admitting: Anesthesiology

## 2019-06-02 ENCOUNTER — Inpatient Hospital Stay (HOSPITAL_COMMUNITY): Payer: Medicare HMO

## 2019-06-02 ENCOUNTER — Encounter (HOSPITAL_COMMUNITY)
Admission: RE | Disposition: A | Discharge status: Home / Self Care | Payer: Self-pay | Source: Home / Self Care | Attending: Orthopedic Surgery

## 2019-06-02 ENCOUNTER — Inpatient Hospital Stay (HOSPITAL_COMMUNITY): Payer: Medicare HMO | Admitting: Physician Assistant

## 2019-06-02 ENCOUNTER — Inpatient Hospital Stay (HOSPITAL_COMMUNITY)
Admission: RE | Admit: 2019-06-02 | Discharge: 2019-06-02 | DRG: 470 | Disposition: A | Discharge status: Home / Self Care | Payer: Medicare HMO | Attending: Orthopedic Surgery | Admitting: Orthopedic Surgery

## 2019-06-02 ENCOUNTER — Encounter (HOSPITAL_COMMUNITY): Payer: Self-pay

## 2019-06-02 DIAGNOSIS — E7800 Pure hypercholesterolemia, unspecified: Secondary | ICD-10-CM | POA: Diagnosis present

## 2019-06-02 DIAGNOSIS — Z809 Family history of malignant neoplasm, unspecified: Secondary | ICD-10-CM

## 2019-06-02 DIAGNOSIS — Z96641 Presence of right artificial hip joint: Secondary | ICD-10-CM

## 2019-06-02 DIAGNOSIS — Z87891 Personal history of nicotine dependence: Secondary | ICD-10-CM

## 2019-06-02 DIAGNOSIS — Z20828 Contact with and (suspected) exposure to other viral communicable diseases: Secondary | ICD-10-CM | POA: Diagnosis present

## 2019-06-02 DIAGNOSIS — I10 Essential (primary) hypertension: Secondary | ICD-10-CM | POA: Diagnosis present

## 2019-06-02 DIAGNOSIS — Z85038 Personal history of other malignant neoplasm of large intestine: Secondary | ICD-10-CM | POA: Diagnosis not present

## 2019-06-02 DIAGNOSIS — M1611 Unilateral primary osteoarthritis, right hip: Principal | ICD-10-CM | POA: Diagnosis present

## 2019-06-02 DIAGNOSIS — Z419 Encounter for procedure for purposes other than remedying health state, unspecified: Secondary | ICD-10-CM

## 2019-06-02 HISTORY — PX: TOTAL HIP ARTHROPLASTY: SHX124

## 2019-06-02 LAB — TYPE AND SCREEN
ABO/RH(D): O POS
Antibody Screen: NEGATIVE

## 2019-06-02 SURGERY — ARTHROPLASTY, HIP, TOTAL, ANTERIOR APPROACH
Anesthesia: General | Site: Hip | Laterality: Right

## 2019-06-02 MED ORDER — MIDAZOLAM HCL 2 MG/2ML IJ SOLN
INTRAMUSCULAR | Status: AC
  Filled 2019-06-02: qty 2

## 2019-06-02 MED ORDER — DEXAMETHASONE SODIUM PHOSPHATE 10 MG/ML IJ SOLN
INTRAMUSCULAR | Status: DC | PRN
  Administered 2019-06-02: 10 mg via INTRAVENOUS

## 2019-06-02 MED ORDER — HYDROMORPHONE HCL 1 MG/ML IJ SOLN: 0.5000 mg | INTRAMUSCULAR | Status: DC | PRN

## 2019-06-02 MED ORDER — TRANEXAMIC ACID-NACL 1000-0.7 MG/100ML-% IV SOLN
1000.0000 mg | Freq: Once | INTRAVENOUS | Status: AC
  Administered 2019-06-02: 1000 mg via INTRAVENOUS
  Filled 2019-06-02: qty 100

## 2019-06-02 MED ORDER — ROCURONIUM BROMIDE 10 MG/ML (PF) SYRINGE
PREFILLED_SYRINGE | INTRAVENOUS | Status: AC
  Filled 2019-06-02: qty 10

## 2019-06-02 MED ORDER — OXYCODONE HCL 5 MG PO TABS
ORAL_TABLET | ORAL | Status: AC
  Administered 2019-06-02: 5 mg via ORAL
  Filled 2019-06-02: qty 1

## 2019-06-02 MED ORDER — HYDROMORPHONE HCL 1 MG/ML IJ SOLN
INTRAMUSCULAR | Status: AC
  Filled 2019-06-02: qty 1

## 2019-06-02 MED ORDER — ONDANSETRON HCL 4 MG/2ML IJ SOLN
INTRAMUSCULAR | Status: AC
  Filled 2019-06-02: qty 2

## 2019-06-02 MED ORDER — PROPOFOL 500 MG/50ML IV EMUL
INTRAVENOUS | Status: AC
  Filled 2019-06-02: qty 50

## 2019-06-02 MED ORDER — SUGAMMADEX SODIUM 200 MG/2ML IV SOLN
INTRAVENOUS | Status: DC | PRN
  Administered 2019-06-02: 200 mg via INTRAVENOUS

## 2019-06-02 MED ORDER — HYDROCODONE-ACETAMINOPHEN 7.5-325 MG PO TABS: 1.0000 | ORAL_TABLET | ORAL | 0 refills | Status: DC | PRN

## 2019-06-02 MED ORDER — FENTANYL CITRATE (PF) 250 MCG/5ML IJ SOLN
INTRAMUSCULAR | Status: DC | PRN
  Administered 2019-06-02: 100 ug via INTRAVENOUS
  Administered 2019-06-02 (×2): 50 ug via INTRAVENOUS

## 2019-06-02 MED ORDER — OXYCODONE HCL 5 MG/5ML PO SOLN: 5.0000 mg | Freq: Once | ORAL | Status: AC | PRN

## 2019-06-02 MED ORDER — LIDOCAINE 2% (20 MG/ML) 5 ML SYRINGE
INTRAMUSCULAR | Status: DC | PRN
  Administered 2019-06-02: 80 mg via INTRAVENOUS

## 2019-06-02 MED ORDER — ONDANSETRON HCL 4 MG/2ML IJ SOLN
4.0000 mg | Freq: Once | INTRAMUSCULAR | Status: AC
  Administered 2019-06-02: 4 mg via INTRAVENOUS

## 2019-06-02 MED ORDER — METHOCARBAMOL 500 MG IVPB - SIMPLE MED
500.0000 mg | Freq: Four times a day (QID) | INTRAVENOUS | Status: DC | PRN
  Administered 2019-06-02: 500 mg via INTRAVENOUS

## 2019-06-02 MED ORDER — KETAMINE HCL 10 MG/ML IJ SOLN
INTRAMUSCULAR | Status: DC | PRN
  Administered 2019-06-02: 50 mg via INTRAVENOUS

## 2019-06-02 MED ORDER — LIDOCAINE 2% (20 MG/ML) 5 ML SYRINGE
INTRAMUSCULAR | Status: AC
  Filled 2019-06-02: qty 5

## 2019-06-02 MED ORDER — PROPOFOL 10 MG/ML IV BOLUS
INTRAVENOUS | Status: DC | PRN
  Administered 2019-06-02: 130 mg via INTRAVENOUS

## 2019-06-02 MED ORDER — MIDAZOLAM HCL 2 MG/2ML IJ SOLN
INTRAMUSCULAR | Status: DC | PRN
  Administered 2019-06-02: 2 mg via INTRAVENOUS

## 2019-06-02 MED ORDER — ONDANSETRON HCL 4 MG/2ML IJ SOLN
INTRAMUSCULAR | Status: DC | PRN
  Administered 2019-06-02: 4 mg via INTRAVENOUS

## 2019-06-02 MED ORDER — KETAMINE HCL 10 MG/ML IJ SOLN
INTRAMUSCULAR | Status: AC
  Filled 2019-06-02: qty 1

## 2019-06-02 MED ORDER — HYDROCODONE-ACETAMINOPHEN 7.5-325 MG PO TABS: 1.0000 | ORAL_TABLET | Freq: Four times a day (QID) | ORAL | Status: DC | PRN

## 2019-06-02 MED ORDER — OXYCODONE HCL 5 MG PO TABS
5.0000 mg | ORAL_TABLET | Freq: Once | ORAL | Status: AC | PRN
  Administered 2019-06-02: 12:00:00 5 mg via ORAL

## 2019-06-02 MED ORDER — PROMETHAZINE HCL 25 MG/ML IJ SOLN
6.2500 mg | INTRAMUSCULAR | Status: DC | PRN
  Administered 2019-06-02: 6.25 mg via INTRAVENOUS

## 2019-06-02 MED ORDER — TRANEXAMIC ACID-NACL 1000-0.7 MG/100ML-% IV SOLN
1000.0000 mg | INTRAVENOUS | Status: AC
  Administered 2019-06-02: 1000 mg via INTRAVENOUS
  Filled 2019-06-02: qty 100

## 2019-06-02 MED ORDER — PHENYLEPHRINE 40 MCG/ML (10ML) SYRINGE FOR IV PUSH (FOR BLOOD PRESSURE SUPPORT)
PREFILLED_SYRINGE | INTRAVENOUS | Status: DC | PRN
  Administered 2019-06-02 (×2): 80 ug via INTRAVENOUS

## 2019-06-02 MED ORDER — SODIUM CHLORIDE 0.9 % IV BOLUS
250.0000 mL | Freq: Once | INTRAVENOUS | Status: AC
  Administered 2019-06-02: 250 mL via INTRAVENOUS

## 2019-06-02 MED ORDER — FERROUS SULFATE 325 (65 FE) MG PO TABS: 325.0000 mg | ORAL_TABLET | Freq: Three times a day (TID) | ORAL | 0 refills | Status: DC

## 2019-06-02 MED ORDER — EPHEDRINE SULFATE-NACL 50-0.9 MG/10ML-% IV SOSY
PREFILLED_SYRINGE | INTRAVENOUS | Status: DC | PRN
  Administered 2019-06-02: 10 mg via INTRAVENOUS

## 2019-06-02 MED ORDER — DEXAMETHASONE SODIUM PHOSPHATE 10 MG/ML IJ SOLN
INTRAMUSCULAR | Status: AC
  Filled 2019-06-02: qty 1

## 2019-06-02 MED ORDER — ROCURONIUM BROMIDE 10 MG/ML (PF) SYRINGE
PREFILLED_SYRINGE | INTRAVENOUS | Status: DC | PRN
  Administered 2019-06-02: 80 mg via INTRAVENOUS
  Administered 2019-06-02: 10 mg via INTRAVENOUS

## 2019-06-02 MED ORDER — METHOCARBAMOL 500 MG IVPB - SIMPLE MED
INTRAVENOUS | Status: AC
  Filled 2019-06-02: qty 50

## 2019-06-02 MED ORDER — LACTATED RINGERS IV SOLN
INTRAVENOUS | Status: DC
  Administered 2019-06-02 (×3): via INTRAVENOUS

## 2019-06-02 MED ORDER — METHOCARBAMOL 500 MG PO TABS: 500.0000 mg | ORAL_TABLET | Freq: Four times a day (QID) | ORAL | 0 refills | Status: DC | PRN

## 2019-06-02 MED ORDER — POLYETHYLENE GLYCOL 3350 17 G PO PACK: 17.0000 g | PACK | Freq: Two times a day (BID) | ORAL | 0 refills | Status: DC

## 2019-06-02 MED ORDER — FENTANYL CITRATE (PF) 100 MCG/2ML IJ SOLN
INTRAMUSCULAR | Status: AC
  Filled 2019-06-02: qty 2

## 2019-06-02 MED ORDER — DEXAMETHASONE SODIUM PHOSPHATE 10 MG/ML IJ SOLN: 10.0000 mg | Freq: Once | INTRAMUSCULAR | Status: DC

## 2019-06-02 MED ORDER — DOCUSATE SODIUM 100 MG PO CAPS: 100.0000 mg | ORAL_CAPSULE | Freq: Two times a day (BID) | ORAL | 0 refills | Status: DC

## 2019-06-02 MED ORDER — SODIUM CHLORIDE 0.9 % IR SOLN
Status: DC | PRN
  Administered 2019-06-02: 1000 mL

## 2019-06-02 MED ORDER — CHLORHEXIDINE GLUCONATE 4 % EX LIQD: 60.0000 mL | Freq: Once | CUTANEOUS | Status: DC

## 2019-06-02 MED ORDER — PHENYLEPHRINE 40 MCG/ML (10ML) SYRINGE FOR IV PUSH (FOR BLOOD PRESSURE SUPPORT)
PREFILLED_SYRINGE | INTRAVENOUS | Status: AC
  Filled 2019-06-02: qty 10

## 2019-06-02 MED ORDER — HYDROMORPHONE HCL 1 MG/ML IJ SOLN
0.2500 mg | INTRAMUSCULAR | Status: DC | PRN
  Administered 2019-06-02 (×4): 0.5 mg via INTRAVENOUS

## 2019-06-02 MED ORDER — ASPIRIN 81 MG PO CHEW: 81.0000 mg | CHEWABLE_TABLET | Freq: Two times a day (BID) | ORAL | 0 refills | Status: AC

## 2019-06-02 MED ORDER — CEFAZOLIN SODIUM-DEXTROSE 2-4 GM/100ML-% IV SOLN
2.0000 g | INTRAVENOUS | Status: AC
  Administered 2019-06-02: 2 g via INTRAVENOUS
  Filled 2019-06-02: qty 100

## 2019-06-02 MED ORDER — PROMETHAZINE HCL 25 MG/ML IJ SOLN
INTRAMUSCULAR | Status: AC
  Filled 2019-06-02: qty 1

## 2019-06-02 SURGICAL SUPPLY — 49 items
BAG DECANTER FOR FLEXI CONT (MISCELLANEOUS) IMPLANT
BAG SPEC THK2 15X12 ZIP CLS (MISCELLANEOUS)
BAG ZIPLOCK 12X15 (MISCELLANEOUS) IMPLANT
BLADE SAG 18X100X1.27 (BLADE) ×3 IMPLANT
BLADE SURG SZ10 CARB STEEL (BLADE) ×6 IMPLANT
COVER PERINEAL POST (MISCELLANEOUS) ×3 IMPLANT
COVER SURGICAL LIGHT HANDLE (MISCELLANEOUS) ×3 IMPLANT
COVER WAND RF STERILE (DRAPES) ×3 IMPLANT
CUP ACET PINNACLE SECTR 56MM (Hips) ×1 IMPLANT
DERMABOND ADVANCED (GAUZE/BANDAGES/DRESSINGS) ×2
DERMABOND ADVANCED .7 DNX12 (GAUZE/BANDAGES/DRESSINGS) ×1 IMPLANT
DRAPE STERI IOBAN 125X83 (DRAPES) ×3 IMPLANT
DRAPE U-SHAPE 47X51 STRL (DRAPES) ×6 IMPLANT
DRESSING AQUACEL AG SP 3.5X10 (GAUZE/BANDAGES/DRESSINGS) ×1 IMPLANT
DRSG AQUACEL AG ADV 3.5X10 (GAUZE/BANDAGES/DRESSINGS) ×3 IMPLANT
DRSG AQUACEL AG SP 3.5X10 (GAUZE/BANDAGES/DRESSINGS) ×3
DURAPREP 26ML APPLICATOR (WOUND CARE) ×3 IMPLANT
ELECT BLADE TIP CTD 4 INCH (ELECTRODE) ×3 IMPLANT
ELECT REM PT RETURN 15FT ADLT (MISCELLANEOUS) ×3 IMPLANT
ELIMINATOR HOLE APEX DEPUY (Hips) ×3 IMPLANT
GLOVE BIO SURGEON STRL SZ 6 (GLOVE) ×6 IMPLANT
GLOVE BIOGEL PI IND STRL 6.5 (GLOVE) ×1 IMPLANT
GLOVE BIOGEL PI IND STRL 7.5 (GLOVE) ×1 IMPLANT
GLOVE BIOGEL PI IND STRL 8.5 (GLOVE) ×1 IMPLANT
GLOVE BIOGEL PI INDICATOR 6.5 (GLOVE) ×2
GLOVE BIOGEL PI INDICATOR 7.5 (GLOVE) ×2
GLOVE BIOGEL PI INDICATOR 8.5 (GLOVE) ×2
GLOVE ECLIPSE 8.0 STRL XLNG CF (GLOVE) ×6 IMPLANT
GLOVE ORTHO TXT STRL SZ7.5 (GLOVE) ×6 IMPLANT
GOWN STRL REUS W/TWL LRG LVL3 (GOWN DISPOSABLE) ×6 IMPLANT
GOWN STRL REUS W/TWL XL LVL3 (GOWN DISPOSABLE) ×3 IMPLANT
HEAD CERAMIC 36 PLUS5 (Hips) ×3 IMPLANT
HOLDER FOLEY CATH W/STRAP (MISCELLANEOUS) ×3 IMPLANT
KIT TURNOVER KIT A (KITS) ×3 IMPLANT
PACK ANTERIOR HIP CUSTOM (KITS) ×3 IMPLANT
PENCIL SMOKE EVACUATOR (MISCELLANEOUS) ×3 IMPLANT
PINNACLE ALTRX PLUS 4 N 36X56 (Hips) ×3 IMPLANT
PINNACLE SECTOR CUP 56MM (Hips) ×3 IMPLANT
SCREW 6.5MMX30MM (Screw) ×3 IMPLANT
STEM FEM ACTIS HIGH SZ7 (Stem) ×3 IMPLANT
SUT MNCRL AB 4-0 PS2 18 (SUTURE) ×3 IMPLANT
SUT STRATAFIX 0 PDS 27 VIOLET (SUTURE) ×3
SUT VIC AB 1 CT1 36 (SUTURE) ×9 IMPLANT
SUT VIC AB 2-0 CT1 27 (SUTURE) ×6
SUT VIC AB 2-0 CT1 TAPERPNT 27 (SUTURE) ×2 IMPLANT
SUTURE STRATFX 0 PDS 27 VIOLET (SUTURE) ×1 IMPLANT
TRAY FOLEY MTR SLVR 16FR STAT (SET/KITS/TRAYS/PACK) ×3 IMPLANT
WATER STERILE IRR 1000ML POUR (IV SOLUTION) ×3 IMPLANT
YANKAUER SUCT BULB TIP 10FT TU (MISCELLANEOUS) IMPLANT

## 2019-06-02 NOTE — Anesthesia Preprocedure Evaluation (Signed)
Anesthesia Evaluation  Patient identified by MRN, date of birth, ID band Patient awake    Reviewed: Allergy & Precautions, NPO status , Patient's Chart, lab work & pertinent test results  Airway Mallampati: II  TM Distance: >3 FB Neck ROM: Full    Dental no notable dental hx.    Pulmonary neg pulmonary ROS, former smoker,    Pulmonary exam normal breath sounds clear to auscultation       Cardiovascular hypertension, Pt. on medications negative cardio ROS Normal cardiovascular exam Rhythm:Regular Rate:Normal     Neuro/Psych negative neurological ROS  negative psych ROS   GI/Hepatic negative GI ROS, Neg liver ROS,   Endo/Other  negative endocrine ROS  Renal/GU negative Renal ROS  negative genitourinary   Musculoskeletal  (+) Arthritis , Osteoarthritis,    Abdominal   Peds negative pediatric ROS (+)  Hematology negative hematology ROS (+)   Anesthesia Other Findings   Reproductive/Obstetrics negative OB ROS                             Anesthesia Physical Anesthesia Plan  ASA: II  Anesthesia Plan: General   Post-op Pain Management:    Induction: Intravenous  PONV Risk Score and Plan: 2 and Ondansetron, Midazolam and Treatment may vary due to age or medical condition  Airway Management Planned: LMA  Additional Equipment:   Intra-op Plan:   Post-operative Plan: Extubation in OR  Informed Consent: I have reviewed the patients History and Physical, chart, labs and discussed the procedure including the risks, benefits and alternatives for the proposed anesthesia with the patient or authorized representative who has indicated his/her understanding and acceptance.     Dental advisory given  Plan Discussed with: CRNA  Anesthesia Plan Comments:         Anesthesia Quick Evaluation

## 2019-06-02 NOTE — Evaluation (Signed)
Physical Therapy Evaluation Patient Details Name: Walter Orozco MRN: [id number] DOB: October 10, 1945 Today's Date: 06/02/2019   History of Present Illness  Patient is 73 y.o. male s/p Rt THA anterior approach with PMH significant for OA, HTN, and colon cancer.  Clinical Impression  Walter Orozco is a 73 y.o. male POD 0 s/p Rt THA anterior approach. Patient reports independence with mobility at baseline. Patient is now limited by functional impairments (see PT problem list below) and requires min guard/supervision for transfers and gait with RW. Patient was able to ambulate ~90 feet with RW and min guard and cues for safe walker management. Patient was limited by nausea and will require additional acute PT session for satir and exercise training prior to DC home. Patient will benefit from continued skilled PT interventions to address impairments and progress towards PLOF.      Follow Up Recommendations Follow surgeon's recommendation for DC plan and follow-up therapies    Equipment Recommendations  None recommended by PT    Recommendations for Other Services       Precautions / Restrictions Precautions Precautions: Fall Restrictions Weight Bearing Restrictions: No      Mobility  Bed Mobility Overal bed mobility: Needs Assistance Bed Mobility: Supine to Sit     Supine to sit: HOB elevated;Supervision     General bed mobility comments: no cues required, no assist to sit up EOB  Transfers Overall transfer level: Needs assistance Equipment used: Rolling walker (2 wheeled) Transfers: Sit to/from Stand Sit to Stand: Supervision;Min guard         General transfer comment: cues for hand placement and technique for use of RW, no assist required from EOB or from wheelchair  Ambulation/Gait Ambulation/Gait assistance: Min guard;Supervision Gait Distance (Feet): 90 Feet Assistive device: Rolling walker (2 wheeled)   Gait velocity: decreased   General Gait Details: cues at start  for safe step pattern in RW and to maintain safe proximity to RW, min guard provided for safety, no overt LOB noted. pt required seated rest after ~85' for gait due to nausea and dizziness.  Stairs            Wheelchair Mobility    Modified Rankin (Stroke Patients Only)       Balance Overall balance assessment: Needs assistance Sitting-balance support: Feet supported Sitting balance-Leahy Scale: Good     Standing balance support: During functional activity;Bilateral upper extremity supported Standing balance-Leahy Scale: Fair              Pertinent Vitals/Pain Pain Assessment: Faces Faces Pain Scale: Hurts a little bit Pain Location: Rt hip Pain Descriptors / Indicators: Aching;Sore Pain Intervention(s): Limited activity within patient's tolerance;Monitored during session;Repositioned    Home Living Family/patient expects to be discharged to:: Private residence Living Arrangements: Spouse/significant other Available Help at Discharge: Family;Available 24 hours/day Type of Home: House Home Access: Stairs to enter Entrance Stairs-Rails: None Entrance Stairs-Number of Steps: 3 Home Layout: One level Home Equipment: Walker - 2 wheels;Bedside commode;Cane - single point      Prior Function Level of Independence: Independent               Hand Dominance   Dominant Hand: Right    Extremity/Trunk Assessment   Upper Extremity Assessment Upper Extremity Assessment: Overall WFL for tasks assessed    Lower Extremity Assessment Lower Extremity Assessment: Overall WFL for tasks assessed;RLE deficits/detail RLE Deficits / Details: pt with good quad activation and good ability to perform hip flexion RLE Sensation: WNL RLE  Coordination: WNL    Cervical / Trunk Assessment Cervical / Trunk Assessment: Normal  Communication   Communication: No difficulties  Cognition Arousal/Alertness: Awake/alert Behavior During Therapy: WFL for tasks  assessed/performed Overall Cognitive Status: Within Functional Limits for tasks assessed               General Comments      Exercises     Assessment/Plan    PT Assessment Patient needs continued PT services  PT Problem List Decreased strength;Decreased balance;Decreased mobility;Decreased range of motion;Decreased activity tolerance;Decreased knowledge of use of DME       PT Treatment Interventions DME instruction;Functional mobility training;Balance training;Patient/family education;Modalities;Gait training;Therapeutic activities;Therapeutic exercise;Stair training    PT Goals (Current goals can be found in the Care Plan section)  Acute Rehab PT Goals Patient Stated Goal: to go home today PT Goal Formulation: With patient Time For Goal Achievement: 06/09/19 Potential to Achieve Goals: Good    Frequency 7X/week    AM-PAC PT "6 Clicks" Mobility  Outcome Measure Help needed turning from your back to your side while in a flat bed without using bedrails?: A Little Help needed moving from lying on your back to sitting on the side of a flat bed without using bedrails?: A Little Help needed moving to and from a bed to a chair (including a wheelchair)?: A Little Help needed standing up from a chair using your arms (e.g., wheelchair or bedside chair)?: A Little Help needed to walk in hospital room?: A Little Help needed climbing 3-5 steps with a railing? : A Little 6 Click Score: 18    End of Session Equipment Utilized During Treatment: Gait belt Activity Tolerance: Patient tolerated treatment well Patient left: in chair;with call bell/phone within reach Nurse Communication: Mobility status PT Visit Diagnosis: Muscle weakness (generalized) (M62.81);Difficulty in walking, not elsewhere classified (R26.2)    Time: [id number] PT Time Calculation (min) (ACUTE ONLY): 33 min   Charges:   PT Evaluation $PT Eval Low Complexity: 1 Low PT Treatments $Gait Training: 8-22 mins        Kipp Brood, PT, DPT Physical Therapist with [facility]  06/02/2019 5:08 PM

## 2019-06-02 NOTE — Op Note (Signed)
NAME:  COLBI MULRY NO.: [id number]      MEDICAL RECORD NO.: [id number]      FACILITY:  [facility]      PHYSICIAN:  Mauri Pole  DATE OF BIRTH:  03-19-1946     DATE OF PROCEDURE:  06/02/2019                                 OPERATIVE REPORT         PREOPERATIVE DIAGNOSIS: Right  hip osteoarthritis.      POSTOPERATIVE DIAGNOSIS:  Right hip osteoarthritis.      PROCEDURE:  Right total hip replacement through an anterior approach   utilizing DePuy THR system, component size 28mm pinnacle cup, a size 36+4 neutral   Altrex liner, a size 7 Hi Actis stem with a 36+5 delta ceramic   ball.      SURGEON:  Pietro Cassis. Alvan Dame, M.D.      ASSISTANT:  Danae Orleans, PA-C     ANESTHESIA:  General.      SPECIMENS:  None.      COMPLICATIONS:  None.      BLOOD LOSS:  400 cc     DRAINS:  None.      INDICATION OF THE PROCEDURE:  DALIAN SMID is a 73 y.o. male who had   presented to office for evaluation of right hip pain.  Radiographs revealed   progressive degenerative changes with bone-on-bone   articulation of the  hip joint, including subchondral cystic changes and osteophytes.  The patient had painful limited range of   motion significantly affecting their overall quality of life and function.  The patient was failing to    respond to conservative measures including medications and/or injections and activity modification and at this point was ready   to proceed with more definitive measures.  Consent was obtained for   benefit of pain relief.  Specific risks of infection, DVT, component   failure, dislocation, neurovascular injury, and need for revision surgery were reviewed in the office as well discussion of   the anterior versus posterior approach were reviewed.     PROCEDURE IN DETAIL:  The patient was brought to operative theater.   Once adequate anesthesia, preoperative antibiotics, 2 gm of Ancef, 1 gm of Tranexamic Acid, and 10 mg of  Decadron were administered, the patient was positioned supine on the [employer] table.  Once the patient was safely positioned with adequate padding of boney prominences we predraped out the hip, and used fluoroscopy to confirm orientation of the pelvis.      The right hip was then prepped and draped from proximal iliac crest to   mid thigh with a shower curtain technique.      Time-out was performed identifying the patient, planned procedure, and the appropriate extremity.     An incision was then made 2 cm lateral to the   anterior superior iliac spine extending over the orientation of the   tensor fascia lata muscle and sharp dissection was carried down to the   fascia of the muscle.      The fascia was then incised.  The muscle belly was identified and swept   laterally and retractor placed along the superior neck.  Following   cauterization of the circumflex vessels and removing some pericapsular  fat, a second cobra retractor was placed on the inferior neck.  A T-capsulotomy was made along the line of the   superior neck to the trochanteric fossa, then extended proximally and   distally.  Tag sutures were placed and the retractors were then placed   intracapsular.  We then identified the trochanteric fossa and   orientation of my neck cut and then made a neck osteotomy with the femur on traction.  The femoral   head was removed without difficulty or complication.  Traction was let   off and retractors were placed posterior and anterior around the   acetabulum.      The labrum and foveal tissue were debrided.  I began reaming with a 46 mm   reamer and reamed up to 55 mm reamer with good bony bed preparation and a 56 mm  cup was chosen.  The final 56 mm Pinnacle cup was then impacted under fluoroscopy to confirm the depth of penetration and orientation with respect to   Abduction and forward flexion.  A screw was placed into the ilium followed by the hole eliminator.  Significant  peri-acetabular osteophytes were debrided from around the acetabulum.  The final   36+4 neutral Altrex liner was impacted with good visualized rim fit.  The cup was positioned anatomically within the acetabular portion of the pelvis.      At this point, the femur was rolled to 100 degrees.  Further capsule was   released off the inferior aspect of the femoral neck.  I then   released the superior capsule proximally.  With the leg in a neutral position the hook was placed laterally   along the femur under the vastus lateralis origin and elevated manually and then held in position using the hook attachment on the bed.  The leg was then extended and adducted with the leg rolled to 100   degrees of external rotation.  Retractors were placed along the medial calcar and posteriorly over the greater trochanter.  Once the proximal femur was fully   exposed, I used a box osteotome to set orientation.  I then began   broaching with the starting chili pepper broach and passed this by hand and then broached up to 7.  With the 7 broach in place I chose a high offset neck and did several trial reductions.  The offset was appropriate, leg lengths   appeared to be equal best matched with the +5 head ball trial confirmed radiographically.   Given these findings, I went ahead and dislocated the hip, repositioned all   retractors and positioned the right hip in the extended and abducted position.  The final 7 Hi Actis stem was   chosen and it was impacted down to the level of neck cut.  Based on this   and the trial reductions, a final 36+5 delta ceramic ball was chosen and   impacted onto a clean and dry trunnion, and the hip was reduced.  The   hip had been irrigated throughout the case again at this point.  I did   reapproximate the superior capsular leaflet to the anterior leaflet   using #1 Vicryl.  The fascia of the   tensor fascia lata muscle was then reapproximated using #1 Vicryl and #0 Stratafix sutures.   The   remaining wound was closed with 2-0 Vicryl and running 4-0 Monocryl.   The hip was cleaned, dried, and dressed sterilely using Dermabond and   Aquacel dressing.  The  patient was then brought   to recovery room in stable condition tolerating the procedure well.    Danae Orleans, PA-C was present for the entirety of the case involved from   preoperative positioning, perioperative retractor management, general   facilitation of the case, as well as primary wound closure as assistant.            Pietro Cassis Alvan Dame, M.D.        06/02/2019 10:19 AM

## 2019-06-02 NOTE — Anesthesia Procedure Notes (Signed)
Procedure Name: Intubation Date/Time: 06/02/2019 8:57 AM Performed by: Sharlette Dense, CRNA Patient Re-evaluated:Patient Re-evaluated prior to induction Oxygen Delivery Method: Circle system utilized Preoxygenation: Pre-oxygenation with 100% oxygen Induction Type: IV induction Ventilation: Mask ventilation without difficulty and Oral airway inserted - appropriate to patient size Laryngoscope Size: Miller and 3 Grade View: Grade I Tube type: Oral Tube size: 8.0 mm Number of attempts: 1 Airway Equipment and Method: Stylet Placement Confirmation: ETT inserted through vocal cords under direct vision,  positive ETCO2 and breath sounds checked- equal and bilateral Secured at: 22 cm Tube secured with: Tape Dental Injury: Teeth and Oropharynx as per pre-operative assessment

## 2019-06-02 NOTE — Interval H&P Note (Signed)
History and Physical Interval Note:  06/02/2019 7:11 AM  Walter Orozco  has presented today for surgery, with the diagnosis of Right hip osteoarthritis.  The various methods of treatment have been discussed with the patient and family. After consideration of risks, benefits and other options for treatment, the patient has consented to  Procedure(s) with comments: TOTAL HIP ARTHROPLASTY ANTERIOR APPROACH (Right) - 70 mins as a surgical intervention.  The patient's history has been reviewed, patient examined, no change in status, stable for surgery.  I have reviewed the patient's chart and labs.  Questions were answered to the patient's satisfaction.     Mauri Pole

## 2019-06-02 NOTE — Discharge Instructions (Signed)

## 2019-06-02 NOTE — Progress Notes (Signed)
PT is at the bedside at this time for evaluation. Pt is alert, calm, and pleasant and has no s/s of distress. VS and orders assessed and will continue to monitor and tx pt according to MD orders.

## 2019-06-02 NOTE — Care Plan (Signed)
Ortho Bundle Case Management Note  Patient Details  Name: LATAVIOUS PERSONIUS MRN: [id number] Date of Birth: 06/23/1946  R THA on 06-02-19 DCP:  Home with spouse.  Ranch style home with basement and 3 ste. DME:  No needs.  Has a RW, 3-in-1, and elevated toilets. PT:  HEP                   DME Arranged:  N/A DME Agency:  NA  HH Arranged:  NA HH Agency:  NA  Additional Comments: Please contact me with any questions of if this plan should need to change.  Marianne Sofia, RN,CCM [facility]  [phone] 06/02/2019, 10:28 AM

## 2019-06-02 NOTE — Transfer of Care (Signed)
Immediate Anesthesia Transfer of Care Note  Patient: [place] P Oelke  Procedure(s) Performed: TOTAL HIP ARTHROPLASTY ANTERIOR APPROACH (Right Hip)  Patient Location: PACU  Anesthesia Type:General  Level of Consciousness: sedated  Airway & Oxygen Therapy: Patient Spontanous Breathing and Patient connected to face mask oxygen  Post-op Assessment: Report given to RN and Post -op Vital signs reviewed and stable  Post vital signs: Reviewed and stable  Last Vitals:  Vitals Value Taken Time  BP 124/71 06/02/19 1039  Temp    Pulse 96 06/02/19 1040  Resp 17 06/02/19 1040  SpO2 100 % 06/02/19 1040  Vitals shown include unvalidated device data.  Last Pain:  Vitals:   06/02/19 0630  TempSrc:   PainSc: 0-No pain         Complications: No apparent anesthesia complications

## 2019-06-02 NOTE — Anesthesia Postprocedure Evaluation (Signed)
Anesthesia Post Note  Patient: Walter Orozco  Procedure(s) Performed: TOTAL HIP ARTHROPLASTY ANTERIOR APPROACH (Right Hip)     Patient location during evaluation: PACU Anesthesia Type: General Level of consciousness: awake and alert Pain management: pain level controlled Vital Signs Assessment: post-procedure vital signs reviewed and stable Respiratory status: spontaneous breathing, nonlabored ventilation and respiratory function stable Cardiovascular status: blood pressure returned to baseline and stable Postop Assessment: no apparent nausea or vomiting Anesthetic complications: no    Last Vitals:  Vitals:   06/02/19 1145 06/02/19 1200  BP: 127/77 117/73  Pulse: 83 90  Resp: 14 20  Temp: 36.5 C   SpO2: 93% 95%    Last Pain:  Vitals:   06/02/19 1200  TempSrc:   PainSc: Asleep                 Lynda Rainwater

## 2019-06-02 NOTE — Progress Notes (Addendum)
Physical Therapy Treatment Patient Details Name: Walter Orozco MRN: [id number] DOB: [birth year] Today's Date: 06/02/2019    History of Present Illness Patient is 73 y.o. male s/p Rt THA anterior approach with PMH significant for OA, HTN, and colon cancer.    PT Comments    Patient seen for additional acute PT session for follow up gait/stair training and HEP education. Pt demonstrated good carryover from first session with safe hand placement and technique for use of RW for sit<>stand transfers and gait with RW. He was instructed on stair mobility with use of RW for safety and verbalized safe guarding position of those helping him. He was instructed on HEP and all questions addressed. Patient has demonstrated safe level of functional mobility for discharge home with assist from family.   Follow Up Recommendations  Follow surgeon's recommendation for DC plan and follow-up therapies     Equipment Recommendations  None recommended by PT    Recommendations for Other Services       Precautions / Restrictions Precautions Precautions: Fall Restrictions Weight Bearing Restrictions: No    Mobility  Bed Mobility Overal bed mobility: Needs Assistance Bed Mobility: Supine to Sit     Supine to sit: HOB elevated;Supervision     General bed mobility comments: pt OOB in recliner  Transfers Overall transfer level: Needs assistance Equipment used: Rolling walker (2 wheeled) Transfers: Sit to/from Stand Sit to Stand: Supervision         General transfer comment: pt with good carryover for hand placement and technique with RW, no assist required from recliner  Ambulation/Gait Ambulation/Gait assistance: Min guard;Supervision Gait Distance (Feet): 65 Feet Assistive device: Rolling walker (2 wheeled) Gait Pattern/deviations: Step-through pattern;Decreased stride length Gait velocity: decreased   General Gait Details: pt with good carryover for hand placement, no overt LOB noted, pt  maintained good proximity to [employer] Stairs: Yes Stairs assistance: Min guard Stair Management: With walker;No rails;Step to pattern;Backwards;Forwards Number of Stairs: 3 General stair comments: pt had observed prior stair training and reviewed handout, verbal cues also provided for sequencing step to pattern for "up wtih good, down with bad". min guard for safety and assist to stabilize walker. pt verbalized safe guarding position of those helping him. performed backwards step up with RW to ascend, and forwards step to descend   Wheelchair Mobility    Modified Rankin (Stroke Patients Only)       Balance Overall balance assessment: Needs assistance Sitting-balance support: Feet supported Sitting balance-Leahy Scale: Good     Standing balance support: During functional activity;Bilateral upper extremity supported Standing balance-Leahy Scale: Fair                     Cognition Arousal/Alertness: Awake/alert Behavior During Therapy: WFL for tasks assessed/performed Overall Cognitive Status: Within Functional Limits for tasks assessed             Exercises Total Joint Exercises Ankle Circles/Pumps: AROM;10 reps;Seated;Both Quad Sets: AROM;5 reps;Seated;Right Heel Slides: AROM;AAROM;5 reps;Seated;Right Hip ABduction/ADduction: AROM;Seated;10 reps;Right Long Arc Quad: AROM;5 reps;Seated;Right Other Exercises Other Exercises: Demonstrated standing hip exercises for patient: marching, knee flexion, hip abducation, hip extension, educated on use of counter for support/safety    General Comments        Pertinent Vitals/Pain Pain Assessment: Faces Faces Pain Scale: Hurts a little bit Pain Location: Rt hip Pain Descriptors / Indicators: Aching;Sore Pain Intervention(s): Limited activity within patient's tolerance;Monitored during session;Repositioned    Home Living Family/patient expects to be discharged to::  Private residence Living Arrangements:  Spouse/significant other Available Help at Discharge: Family;Available 24 hours/day Type of Home: House Home Access: Stairs to enter Entrance Stairs-Rails: None Home Layout: One level Home Equipment: Environmental consultant - 2 wheels;Bedside commode;Cane - single point      Prior Function Level of Independence: Independent          PT Goals (current goals can now be found in the care plan section) Acute Rehab PT Goals Patient Stated Goal: to go home today PT Goal Formulation: With patient Time For Goal Achievement: 06/09/19 Potential to Achieve Goals: Good Progress towards PT goals: Progressing toward goals    Frequency    7X/week      PT Plan Current plan remains appropriate       AM-PAC PT "6 Clicks" Mobility   Outcome Measure  Help needed turning from your back to your side while in a flat bed without using bedrails?: A Little Help needed moving from lying on your back to sitting on the side of a flat bed without using bedrails?: A Little Help needed moving to and from a bed to a chair (including a wheelchair)?: A Little Help needed standing up from a chair using your arms (e.g., wheelchair or bedside chair)?: A Little Help needed to walk in hospital room?: A Little Help needed climbing 3-5 steps with a railing? : A Little 6 Click Score: 18    End of Session Equipment Utilized During Treatment: Gait belt Activity Tolerance: Patient tolerated treatment well Patient left: in chair;with call bell/phone within reach Nurse Communication: Mobility status PT Visit Diagnosis: Muscle weakness (generalized) (M62.81);Difficulty in walking, not elsewhere classified (R26.2)     Time: [id number] PT Time Calculation (min) (ACUTE ONLY): 29 min  Charges:  $Gait Training: 8-22 mins $Therapeutic Exercise: 8-22 mins                     Kipp Brood, PT, DPT Physical Therapist with [facility]  06/02/2019 8:10 PM

## 2019-06-04 ENCOUNTER — Encounter: Payer: Self-pay | Admitting: *Deleted

## 2019-06-09 NOTE — Discharge Summary (Signed)
Physician Discharge Summary  Patient ID: Walter Orozco MRN: [id number] DOB/AGE: April 01, 1946 73 y.o.  Admit date: 06/02/2019 Discharge date: 06/02/2019   Procedures:  Procedure(s) (LRB): TOTAL HIP ARTHROPLASTY ANTERIOR APPROACH (Right)  Attending Physician:  Dr. Paralee Cancel   Admission Diagnoses:   Right hip primary OA / pain  Discharge Diagnoses:  Principal Problem:   S/P right THA, AA Active Problems:   Osteoarthritis of right hip  Past Medical History:  Diagnosis Date  . 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 Arthritis   . High cholesterol   . Hypertension     HPI:    Walter Orozco, 73 y.o. male, has a history of pain and functional disability in the right hip(s) due to arthritis and patient has failed non-surgical conservative treatments for greater than 12 weeks to include NSAID's and/or analgesics and activity modification.  Onset of symptoms was gradual starting 1+ years ago with gradually worsening course since that time.The patient noted no past surgery on the right hip(s).  Patient currently rates pain in the right hip at 7 out of 10 with activity. Patient has night pain, worsening of pain with activity and weight bearing, trendelenberg gait, pain that interfers with activities of daily living and pain with passive range of motion. Patient has evidence of periarticular osteophytes and joint space narrowing by imaging studies. This condition presents safety issues increasing the risk of falls. There is no current active infection.  Risks, benefits and expectations were discussed with the patient.  Risks including but not limited to the risk of anesthesia, blood clots, nerve damage, blood vessel damage, failure of the prosthesis, infection and up to and including death.  Patient understand the risks, benefits and expectations and wishes to proceed with surgery.   PCP: Lucia Gaskins, MD   Discharged Condition: good  Hospital Course:  Patient underwent the  above stated procedure on 06/02/2019. Patient tolerated the procedure well and brought to the recovery room in good condition.  He was felt to being doing well and worked well with PT to be safely discharged home.    Discharge Exam: General appearance: alert, cooperative and no distress Extremities: Homans sign is negative, no sign of DVT, no edema, redness or tenderness in the calves or thighs and no ulcers, gangrene or trophic changes  Disposition:  Home with follow up in 2 weeks   Follow-up Information    Danae Orleans, PA-C. Go on 06/17/2019.   Specialty: Orthopedic Surgery Why: You are scheduled for a post-operative appointment on 06-17-19 at 2:00 pm.  Contact information: [address] [place] [address] [id number]           Discharge Instructions    Call MD / Call 911   Complete by: As directed    If you experience chest pain or shortness of breath, CALL 911 and be transported to the hospital emergency room.  If you develope a fever above 101 F, pus (white drainage) or increased drainage or redness at the wound, or calf pain, call your surgeon's office.   Change dressing   Complete by: As directed    Maintain surgical dressing until follow up in the clinic. If the edges start to pull up, may reinforce with tape. If the dressing is no longer working, may remove and cover with gauze and tape, but must keep the area dry and clean.  Call with any questions or concerns.   Constipation Prevention   Complete by: As directed  Drink plenty of fluids.  Prune juice may be helpful.  You may use a stool softener, such as Colace (over the counter) 100 mg twice a day.  Use MiraLax (over the counter) for constipation as needed.   Diet - low sodium heart healthy   Complete by: As directed    Discharge instructions   Complete by: As directed    Maintain surgical dressing until follow up in the clinic. If the edges start to pull up, may reinforce with tape. If the  dressing is no longer working, may remove and cover with gauze and tape, but must keep the area dry and clean.  Follow [facility] in [facility] at [facility]. Call with any questions or concerns.   Increase activity slowly as tolerated   Complete by: As directed    Weight bearing as tolerated with assist device (walker, cane, etc) as directed, use it as long as suggested by your surgeon or therapist, typically at least 4-6 weeks.   TED hose   Complete by: As directed    Use stockings (TED hose) for 2 weeks on both leg(s).  You may remove them at night for sleeping.      Allergies as of 06/02/2019   No Known Allergies     Medication List    STOP taking these medications   acetaminophen 500 MG tablet Commonly known as: TYLENOL   naproxen sodium 220 MG tablet Commonly known as: ALEVE   Vitamin D-3 125 MCG (5000 UT) Tabs     TAKE these medications   amLODipine 10 MG tablet Commonly known as: NORVASC Take 10 mg by mouth daily.   aspirin 81 MG chewable tablet Commonly known as: Aspirin Childrens Chew 1 tablet (81 mg total) by mouth 2 (two) times daily. Take for 4 weeks, then resume regular dose.   docusate sodium 100 MG capsule Commonly known as: Colace Take 1 capsule (100 mg total) by mouth 2 (two) times daily.   ferrous sulfate 325 (65 FE) MG tablet Commonly known as: FerrouSul Take 1 tablet (325 mg total) by mouth 3 (three) times daily with meals for 14 days.   HYDROcodone-acetaminophen 7.5-325 MG tablet Commonly known as: Norco Take 1-2 tablets by mouth every 4 (four) hours as needed for moderate pain.   lisinopril 10 MG tablet Commonly known as: ZESTRIL Take 10 mg by mouth daily.   methocarbamol 500 MG tablet Commonly known as: Robaxin Take 1 tablet (500 mg total) by mouth every 6 (six) hours as needed for muscle spasms.   polyethylene glycol 17 g packet Commonly known as: MIRALAX / GLYCOLAX Take 17 g by mouth 2 (two) times daily.   simvastatin 40 MG  tablet Commonly known as: ZOCOR Take 40 mg by mouth daily.            Discharge Care Instructions  (From admission, onward)         Start     Ordered   06/02/19 0000  Change dressing    Comments: Maintain surgical dressing until follow up in the clinic. If the edges start to pull up, may reinforce with tape. If the dressing is no longer working, may remove and cover with gauze and tape, but must keep the area dry and clean.  Call with any questions or concerns.   06/02/19 0805           Signed: West Pugh. Moua Rasmusson   PA-C  06/09/2019, 8:14 AM

## 2019-12-15 NOTE — H&P (Signed)
Surgical History & Physical  Patient Name: Walter Orozco DOB: 07-01-1945  Surgery: Cataract extraction with intraocular lens implant phacoemulsification; Left Eye  Surgeon: Baruch Goldmann MD Surgery Date:  12/23/2019 Pre-Op Date:  12/15/2019  HPI: A 59 Yr. old male patient -referred by Dr. Jorja Loa for cataract eval 1. 1. The patient complains of difficulty when driving at night, which began 5 months ago. Both eyes are affected. The episode is constant and gradual. The patient describes foggy, ghosting, glare and hazy symptoms affecting their eyes/vision. The condition's severity is moderate. Symptoms occur when the patient is driving. OS worse c/w OD. Can close OS at times and see better overall. No sudden change but significant decrease with night driving due to glare/halos. This is negatively affecting the patient's quality of life. HPI Completed by Dr. Baruch Goldmann  Medical History: Cataracts Cancer High Blood Pressure LDL  Review of Systems Eyes blurred VA All recorded systems are negative except as noted above.  Social   Never smoked   Medication Amiodipine, Lisinopril, Simvastatin, Vitamin D3,   Sx/Procedures Colon Cancer, Right Hip Replacement,   Drug Allergies   NKDA  History & Physical: Heent:  Cataract, Left eye NECK: supple without bruits LUNGS: lungs clear to auscultation CV: regular rate and rhythm Abdomen: soft and non-tender  Impression & Plan: Assessment: 1.  NUCLEAR SCLEROSIS AGE RELATED; Both Eyes (H25.13) 2.  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  ASTIGMATISM, REGULAR; Both Eyes (H52.223)  Plan: 1.  Cataract accounts for the patient's decreased vision. This visual impairment is not correctable with a tolerable change in glasses or contact lenses. Cataract surgery with an implantation of a new lens should significantly improve the visual and functional status of the patient. Discussed all risks, benefits, alternatives, and potential  complications. Discussed the procedures and recovery. Patient desires to have surgery. A-scan ordered and performed today for intra-ocular lens calculations. The surgery will be performed in order to improve vision for driving, reading, and for eye examinations. Recommend phacoemulsification with intra-ocular lens. Left Eye worse - first. Surgery required to correct imbalance of vision. Dilates well - shugarcaine by protocol. Toric Lens. 2.  Chronic. remote trauma. Call with any worsening vision, pain, or any other concerns. 3.  Recommend Toric lens OS as above No toric for OD - scar makes calculations unreliable.

## 2019-12-21 ENCOUNTER — Other Ambulatory Visit (HOSPITAL_COMMUNITY): Admission: RE | Admit: 2019-12-21 | Payer: Medicare HMO | Source: Ambulatory Visit

## 2019-12-21 ENCOUNTER — Encounter (HOSPITAL_COMMUNITY)
Admission: RE | Admit: 2019-12-21 | Discharge: 2019-12-21 | Disposition: A | Discharge status: Home / Self Care | Payer: Medicare HMO | Source: Ambulatory Visit | Attending: Ophthalmology | Admitting: Ophthalmology

## 2019-12-23 ENCOUNTER — Encounter (HOSPITAL_COMMUNITY)
Admission: RE | Disposition: A | Discharge status: Home / Self Care | Payer: Self-pay | Source: Home / Self Care | Attending: Ophthalmology

## 2019-12-23 ENCOUNTER — Ambulatory Visit (HOSPITAL_COMMUNITY): Payer: Medicare HMO | Admitting: Anesthesiology

## 2019-12-23 ENCOUNTER — Other Ambulatory Visit: Payer: Self-pay

## 2019-12-23 ENCOUNTER — Encounter (HOSPITAL_COMMUNITY): Payer: Self-pay | Admitting: Ophthalmology

## 2019-12-23 ENCOUNTER — Ambulatory Visit (HOSPITAL_COMMUNITY)
Admission: RE | Admit: 2019-12-23 | Discharge: 2019-12-23 | Disposition: A | Discharge status: Home / Self Care | Payer: Medicare HMO | Attending: Ophthalmology | Admitting: Ophthalmology

## 2019-12-23 DIAGNOSIS — H2512 Age-related nuclear cataract, left eye: Secondary | ICD-10-CM | POA: Diagnosis not present

## 2019-12-23 DIAGNOSIS — Z96641 Presence of right artificial hip joint: Secondary | ICD-10-CM | POA: Insufficient documentation

## 2019-12-23 DIAGNOSIS — Z87891 Personal history of nicotine dependence: Secondary | ICD-10-CM | POA: Diagnosis not present

## 2019-12-23 DIAGNOSIS — H52202 Unspecified astigmatism, left eye: Secondary | ICD-10-CM | POA: Diagnosis not present

## 2019-12-23 DIAGNOSIS — I10 Essential (primary) hypertension: Secondary | ICD-10-CM | POA: Diagnosis not present

## 2019-12-23 DIAGNOSIS — Z85038 Personal history of other malignant neoplasm of large intestine: Secondary | ICD-10-CM | POA: Insufficient documentation

## 2019-12-23 DIAGNOSIS — M1990 Unspecified osteoarthritis, unspecified site: Secondary | ICD-10-CM | POA: Diagnosis not present

## 2019-12-23 DIAGNOSIS — Z79899 Other long term (current) drug therapy: Secondary | ICD-10-CM | POA: Insufficient documentation

## 2019-12-23 HISTORY — PX: CATARACT EXTRACTION W/PHACO: SHX586

## 2019-12-23 SURGERY — PHACOEMULSIFICATION, CATARACT, WITH IOL INSERTION
Anesthesia: Monitor Anesthesia Care | Site: Eye | Laterality: Left

## 2019-12-23 MED ORDER — CYCLOPENTOLATE-PHENYLEPHRINE 0.2-1 % OP SOLN
1.0000 [drp] | OPHTHALMIC | Status: AC | PRN
  Administered 2019-12-23 (×3): 1 [drp] via OPHTHALMIC

## 2019-12-23 MED ORDER — SODIUM CHLORIDE 0.9% FLUSH
10.0000 mL | INTRAVENOUS | Status: DC | PRN
  Administered 2019-12-23: 6 mL via INTRAVENOUS

## 2019-12-23 MED ORDER — SODIUM HYALURONATE 23 MG/ML IO SOLN
INTRAOCULAR | Status: DC | PRN
  Administered 2019-12-23: 0.6 mL via INTRAOCULAR

## 2019-12-23 MED ORDER — MIDAZOLAM HCL 2 MG/2ML IJ SOLN
INTRAMUSCULAR | Status: AC
  Filled 2019-12-23: qty 2

## 2019-12-23 MED ORDER — PHENYLEPHRINE HCL 2.5 % OP SOLN
1.0000 [drp] | OPHTHALMIC | Status: AC | PRN
  Administered 2019-12-23 (×3): 1 [drp] via OPHTHALMIC

## 2019-12-23 MED ORDER — LIDOCAINE HCL 3.5 % OP GEL: 1.0000 "application " | Freq: Once | OPHTHALMIC | Status: DC

## 2019-12-23 MED ORDER — BSS IO SOLN
INTRAOCULAR | Status: DC | PRN
  Administered 2019-12-23: 15 mL via INTRAOCULAR

## 2019-12-23 MED ORDER — EPINEPHRINE PF 1 MG/ML IJ SOLN
INTRAOCULAR | Status: DC | PRN
  Administered 2019-12-23: 500 mL

## 2019-12-23 MED ORDER — MIDAZOLAM HCL 2 MG/2ML IJ SOLN
INTRAMUSCULAR | Status: DC | PRN
  Administered 2019-12-23: 1 mg via INTRAVENOUS

## 2019-12-23 MED ORDER — LIDOCAINE HCL 3.5 % OP GEL
OPHTHALMIC | Status: AC
  Filled 2019-12-23: qty 1

## 2019-12-23 MED ORDER — PROVISC 10 MG/ML IO SOLN
INTRAOCULAR | Status: DC | PRN
  Administered 2019-12-23: 0.85 mL via INTRAOCULAR

## 2019-12-23 MED ORDER — POVIDONE-IODINE 5 % OP SOLN
OPHTHALMIC | Status: DC | PRN
  Administered 2019-12-23: 1 via OPHTHALMIC

## 2019-12-23 MED ORDER — TETRACAINE HCL 0.5 % OP SOLN
1.0000 [drp] | OPHTHALMIC | Status: AC | PRN
  Administered 2019-12-23 (×3): 1 [drp] via OPHTHALMIC

## 2019-12-23 MED ORDER — LACTATED RINGERS IV SOLN: INTRAVENOUS | Status: DC

## 2019-12-23 MED ORDER — LIDOCAINE HCL (PF) 1 % IJ SOLN
INTRAOCULAR | Status: DC | PRN
  Administered 2019-12-23: 1 mL via OPHTHALMIC

## 2019-12-23 SURGICAL SUPPLY — 16 items
CLOTH BEACON ORANGE TIMEOUT ST (SAFETY) ×2 IMPLANT
EYE SHIELD UNIVERSAL CLEAR (GAUZE/BANDAGES/DRESSINGS) ×2 IMPLANT
GLOVE BIOGEL PI IND STRL 7.0 (GLOVE) ×2 IMPLANT
GLOVE BIOGEL PI INDICATOR 7.0 (GLOVE) ×2
LENS IOL EYHANCE TORIC II 21.5 ×2 IMPLANT
LENS IOL EYHANCE TRC 150 21.5 ×1 IMPLANT
LENS IOL EYHNC TORIC 150 21.5 ×1 IMPLANT
NEEDLE HYPO 18GX1.5 BLUNT FILL (NEEDLE) ×2 IMPLANT
PAD ARMBOARD 7.5X6 YLW CONV (MISCELLANEOUS) ×2 IMPLANT
PROC W SPEC LENS (INTRAOCULAR LENS) ×2
PROCESS W SPEC LENS (INTRAOCULAR LENS) ×1 IMPLANT
SYR TB 1ML LL NO SAFETY (SYRINGE) ×2 IMPLANT
TAPE SURG TRANSPORE 1 IN (GAUZE/BANDAGES/DRESSINGS) ×1 IMPLANT
TAPE SURGICAL TRANSPORE 1 IN (GAUZE/BANDAGES/DRESSINGS) ×2
VISCOELASTIC ADDITIONAL (OPHTHALMIC RELATED) ×2 IMPLANT
WATER STERILE IRR 250ML POUR (IV SOLUTION) ×2 IMPLANT

## 2019-12-23 NOTE — Interval H&P Note (Signed)
History and Physical Interval Note:  12/23/2019 1:08 PM  Coleby P Delamater  has presented today for surgery, with the diagnosis of Nuclear sclerotic cataract - Left eye.  The various methods of treatment have been discussed with the patient and family. After consideration of risks, benefits and other options for treatment, the patient has consented to  Procedure(s) with comments: CATARACT EXTRACTION PHACO AND INTRAOCULAR LENS PLACEMENT (IOC) (Left) - CDE:  as a surgical intervention.  The patient's history has been reviewed, patient examined, no change in status, stable for surgery.  I have reviewed the patient's chart and labs.  Questions were answered to the patient's satisfaction.     Baruch Goldmann

## 2019-12-23 NOTE — Anesthesia Preprocedure Evaluation (Addendum)
Anesthesia Evaluation  Patient identified by MRN, date of birth, ID band Patient awake    Reviewed: Allergy & Precautions, NPO status , Patient's Chart, lab work & pertinent test results  Airway Mallampati: II  TM Distance: >3 FB Neck ROM: Full    Dental no notable dental hx.    Pulmonary neg pulmonary ROS, former smoker,    Pulmonary exam normal breath sounds clear to auscultation       Cardiovascular hypertension, Pt. on medications negative cardio ROS Normal cardiovascular exam Rhythm:Regular Rate:Normal     Neuro/Psych negative neurological ROS  negative psych ROS   GI/Hepatic negative GI ROS, Neg liver ROS,   Endo/Other  negative endocrine ROS  Renal/GU negative Renal ROS  negative genitourinary   Musculoskeletal  (+) Arthritis , Osteoarthritis,    Abdominal   Peds negative pediatric ROS (+)  Hematology negative hematology ROS (+)   Anesthesia Other Findings   Reproductive/Obstetrics negative OB ROS                             Anesthesia Physical  Anesthesia Plan  ASA: II  Anesthesia Plan: MAC   Post-op Pain Management:    Induction: Intravenous  PONV Risk Score and Plan: 2 and Ondansetron and Midazolam  Airway Management Planned:   Additional Equipment:   Intra-op Plan:   Post-operative Plan:   Informed Consent: I have reviewed the patients History and Physical, chart, labs and discussed the procedure including the risks, benefits and alternatives for the proposed anesthesia with the patient or authorized representative who has indicated his/her understanding and acceptance.     Dental advisory given  Plan Discussed with: CRNA  Anesthesia Plan Comments:         Anesthesia Quick Evaluation  

## 2019-12-23 NOTE — Anesthesia Procedure Notes (Signed)
Procedure Name: MAC Date/Time: 12/23/2019 1:13 PM Performed by: Vista Deck, CRNA Pre-anesthesia Checklist: Patient identified, Emergency Drugs available, Suction available, Timeout performed and Patient being monitored Patient Re-evaluated:Patient Re-evaluated prior to induction Oxygen Delivery Method: Nasal Cannula

## 2019-12-23 NOTE — Op Note (Signed)
Date of procedure: 03/28/18  Pre-operative diagnosis: Visually significant age-related nuclear cataract, Left Eye; Visually Significant Astigmatism, Left Eye (H25.12)  Post-operative diagnosis: Visually significant age-related cataract, Left Eye; Visually Significant Astigmatism, Left Eye  Procedure: Removal of cataract via phacoemulsification and insertion of intra-ocular lens Wynetta Emery and Johnson [id number] +21.5D into the capsular bag of the Left Eye  Attending surgeon: Gerda Diss. Domenick Quebedeaux, MD, MA  Anesthesia: MAC, Topical Akten  Complications: None  Estimated Blood Loss: <48m (minimal)  Specimens: None  Implants: As above  Indications:  Visually significant age-related cataract, Left Eye; Visually Significant Astigmatism, Left Eye  Procedure:  The patient was seen and identified in the pre-operative area. The operative eye was identified and dilated.  The operative eye was marked.  Pre-operative toric markers were used to mark the eye at 0 and 180 degrees. Topical anesthesia was administered to the operative eye.     The patient was then to the operative suite and placed in the supine position.  A timeout was performed confirming the patient, procedure to be performed, and all other relevant information.   The patient's face was prepped and draped in the usual fashion for intra-ocular surgery.  A lid speculum was placed into the operative eye and the surgical microscope moved into place and focused.  A superotemporal paracentesis was created using a 20 gauge paracentesis blade.  Shugarcaine was injected into the anterior chamber.  Viscoelastic was injected into the anterior chamber.  A temporal clear-corneal main wound incision was created using a 2.454mmicrokeratome.  A continuous curvilinear capsulorrhexis was initiated using an irrigating cystitome and completed using capsulorrhexis forceps.  Hydrodissection and hydrodeliniation were performed.  Viscoelastic was injected into the anterior  chamber.  A phacoemulsification handpiece and a chopper as a second instrument were used to remove the nucleus and epinucleus. The irrigation/aspiration handpiece was used to remove any remaining cortical material.   The capsular bag was reinflated with viscoelastic, checked, and found to be intact.  The eye was marked to the per-op meridian.  The intraocular lens was inserted into the capsular bag and dialed into place using a Kuglen hook to 167 degrees.  The irrigation/aspiration handpiece was used to remove any remaining viscoelastic.  The clear corneal wound and paracentesis wounds were then hydrated and checked with Weck-Cels to be watertight.  The lid-speculum and drape was removed, and the patient's face was cleaned with a wet and dry 4x4.  Maxitrol was instilled in the eye before a clear shield was taped over the eye. The patient was taken to the post-operative care unit in good condition, having tolerated the procedure well.  Post-Op Instructions: [facility] and will receive all other orders and instructions.

## 2019-12-23 NOTE — Discharge Instructions (Signed)
Please discharge patient when stable, will follow up today with Dr. Norton Bivins at the [facility] [place] office immediately following discharge.  Leave shield in place until visit.  All paperwork with discharge instructions will be given at the office.   [facility] Address:  [address]  [place], [place] [address]  

## 2019-12-23 NOTE — Transfer of Care (Signed)
Immediate Anesthesia Transfer of Care Note  Patient: Walter Orozco  Procedure(s) Performed: CATARACT EXTRACTION PHACO AND INTRAOCULAR LENS PLACEMENT LEFT EYE (Left Eye)  Patient Location: Short Stay  Anesthesia Type:MAC  Level of Consciousness: awake, alert  and patient cooperative  Airway & Oxygen Therapy: Patient Spontanous Breathing  Post-op Assessment: Report given to RN and Post -op Vital signs reviewed and stable  Post vital signs: Reviewed and stable  Last Vitals:  Vitals Value Taken Time  BP    Temp    Pulse    Resp    SpO2      Last Pain:  Vitals:   12/23/19 1229  TempSrc: Oral  PainSc: 0-No pain      Patients Stated Pain Goal: 7 (45/36/46 8032)  Complications: No complications documented.

## 2019-12-23 NOTE — Anesthesia Postprocedure Evaluation (Signed)
Anesthesia Post Note  Patient: Walter Orozco  Procedure(s) Performed: CATARACT EXTRACTION PHACO AND INTRAOCULAR LENS PLACEMENT LEFT EYE (Left Eye)  Patient location during evaluation: Short Stay Anesthesia Type: MAC Level of consciousness: awake and alert and patient cooperative Pain management: satisfactory to patient Vital Signs Assessment: post-procedure vital signs reviewed and stable Respiratory status: spontaneous breathing Cardiovascular status: stable Postop Assessment: no apparent nausea or vomiting Anesthetic complications: no   No complications documented.   Last Vitals:  Vitals:   12/23/19 1229 12/23/19 1343  BP: 109/71 123/72  Pulse: 81 80  Resp: 19 18  Temp: 36.8 C 36.7 C  SpO2: 96% 95%    Last Pain:  Vitals:   12/23/19 1343  TempSrc: Oral  PainSc: 0-No pain                 Fernando Torry

## 2019-12-24 ENCOUNTER — Encounter (HOSPITAL_COMMUNITY): Payer: Self-pay | Admitting: Ophthalmology

## 2019-12-24 MED ORDER — NEOMYCIN-POLYMYXIN-DEXAMETH 3.5-10000-0.1 OP SUSP
OPHTHALMIC | Status: AC | PRN
  Administered 2019-12-23: 1 [drp] via OPHTHALMIC

## 2020-01-01 NOTE — H&P (Signed)
Surgical History & Physical  Patient Name: Walter Orozco DOB: 18-Aug-1945  Surgery: Cataract extraction with intraocular lens implant phacoemulsification; Right Eye  Surgeon: Baruch Goldmann MD Surgery Date:  01/08/2020 Pre-Op Date:  12/31/2019  HPI: A 11 Yr. old male patient 1. The patient complains of difficulty when reading fine print, books, newspaper, instructions etc., which began many years ago. The right eye is affected. The episode is constant. The condition is worse with daily activities. The complaint is associated with blurry vision. 2. The patient is returning after cataract post-op. The left eye is affected. Since the last visit, the affected area is doing well. Patient is following medication instructions.  Medical History: Cataracts CE IOL OS 12/23/19 Cancer High Blood Pressure LDL  Review of Systems Eyes blurred VA All recorded systems are negative except as noted above.  Social   Never smoked  Medication Omni,  Amiodipine, Lisinopril, Simvastatin, Vitamin D3,   Sx/Procedures Phaco c IOL OS,  Colon Cancer, Right Hip Replacement,   Drug Allergies   NKDA  History & Physical: Heent:  Cataract, Right eye NECK: supple without bruits LUNGS: lungs clear to auscultation CV: regular rate and rhythm Abdomen: soft and non-tender  Impression & Plan: Assessment: 1.  NUCLEAR SCLEROSIS AGE RELATED; , Right Eye (H25.11) 2.  CATARACT EXTRACTION STATUS; Left Eye (Z98.42)  Plan: 1.  Surgery required to correct imbalance of vision. Dilates well - shugarcaine by protocol. Cataract accounts for the patient's decreased vision. This visual impairment is not correctable with a tolerable change in glasses or contact lenses. Cataract surgery with an implantation of a new lens should significantly improve the visual and functional status of the patient. Discussed all risks, benefits, alternatives, and potential complications. Discussed the procedures and recovery. Patient desires  to have surgery. A-scan ordered and performed today for intra-ocular lens calculations. The surgery will be performed in order to improve vision for driving, reading, and for eye examinations. Recommend phacoemulsification with intra-ocular lens. Right Eye. 2.  1 week after cataract surgery. Doing well with improved vision and normal eye pressure. Call with any problems or concerns. Continue Gati-Brom-Pred 2x/day for 3 more weeks.

## 2020-01-06 ENCOUNTER — Other Ambulatory Visit: Payer: Self-pay

## 2020-01-06 ENCOUNTER — Encounter (HOSPITAL_COMMUNITY): Payer: Self-pay

## 2020-01-06 ENCOUNTER — Encounter (HOSPITAL_COMMUNITY)
Admit: 2020-01-06 | Discharge: 2020-01-06 | Disposition: A | Discharge status: Home / Self Care | Payer: Medicare HMO | Attending: Ophthalmology | Admitting: Ophthalmology

## 2020-01-08 ENCOUNTER — Encounter (HOSPITAL_COMMUNITY)
Admission: RE | Disposition: A | Discharge status: Home / Self Care | Payer: Self-pay | Source: Home / Self Care | Attending: Ophthalmology

## 2020-01-08 ENCOUNTER — Ambulatory Visit (HOSPITAL_COMMUNITY): Payer: Medicare HMO | Admitting: Anesthesiology

## 2020-01-08 ENCOUNTER — Encounter (HOSPITAL_COMMUNITY): Payer: Self-pay | Admitting: Ophthalmology

## 2020-01-08 ENCOUNTER — Ambulatory Visit (HOSPITAL_COMMUNITY)
Admission: RE | Admit: 2020-01-08 | Discharge: 2020-01-08 | Disposition: A | Discharge status: Home / Self Care | Payer: Medicare HMO | Attending: Ophthalmology | Admitting: Ophthalmology

## 2020-01-08 DIAGNOSIS — Z9842 Cataract extraction status, left eye: Secondary | ICD-10-CM | POA: Insufficient documentation

## 2020-01-08 DIAGNOSIS — H2511 Age-related nuclear cataract, right eye: Secondary | ICD-10-CM | POA: Diagnosis not present

## 2020-01-08 DIAGNOSIS — Z87891 Personal history of nicotine dependence: Secondary | ICD-10-CM | POA: Diagnosis not present

## 2020-01-08 DIAGNOSIS — I10 Essential (primary) hypertension: Secondary | ICD-10-CM | POA: Diagnosis not present

## 2020-01-08 DIAGNOSIS — Z85038 Personal history of other malignant neoplasm of large intestine: Secondary | ICD-10-CM | POA: Diagnosis not present

## 2020-01-08 DIAGNOSIS — Z961 Presence of intraocular lens: Secondary | ICD-10-CM | POA: Diagnosis not present

## 2020-01-08 DIAGNOSIS — M1990 Unspecified osteoarthritis, unspecified site: Secondary | ICD-10-CM | POA: Diagnosis not present

## 2020-01-08 DIAGNOSIS — Z79899 Other long term (current) drug therapy: Secondary | ICD-10-CM | POA: Insufficient documentation

## 2020-01-08 HISTORY — PX: CATARACT EXTRACTION W/PHACO: SHX586

## 2020-01-08 SURGERY — PHACOEMULSIFICATION, CATARACT, WITH IOL INSERTION
Anesthesia: Monitor Anesthesia Care | Site: Eye | Laterality: Right

## 2020-01-08 MED ORDER — ONDANSETRON HCL 4 MG/2ML IJ SOLN
INTRAMUSCULAR | Status: DC | PRN
  Administered 2020-01-08: 4 mg via INTRAVENOUS

## 2020-01-08 MED ORDER — BSS IO SOLN
INTRAOCULAR | Status: DC | PRN
  Administered 2020-01-08: 15 mL via INTRAOCULAR

## 2020-01-08 MED ORDER — PHENYLEPHRINE HCL 2.5 % OP SOLN
1.0000 [drp] | OPHTHALMIC | Status: AC | PRN
  Administered 2020-01-08 (×3): 1 [drp] via OPHTHALMIC

## 2020-01-08 MED ORDER — MIDAZOLAM HCL 2 MG/2ML IJ SOLN
INTRAMUSCULAR | Status: AC
  Filled 2020-01-08: qty 2

## 2020-01-08 MED ORDER — SODIUM HYALURONATE 23 MG/ML IO SOLN
INTRAOCULAR | Status: DC | PRN
  Administered 2020-01-08: 0.6 mL via INTRAOCULAR

## 2020-01-08 MED ORDER — EPINEPHRINE PF 1 MG/ML IJ SOLN
INTRAMUSCULAR | Status: AC
  Filled 2020-01-08: qty 2

## 2020-01-08 MED ORDER — PROVISC 10 MG/ML IO SOLN
INTRAOCULAR | Status: DC | PRN
  Administered 2020-01-08: 0.85 mL via INTRAOCULAR

## 2020-01-08 MED ORDER — TETRACAINE HCL 0.5 % OP SOLN
1.0000 [drp] | OPHTHALMIC | Status: AC | PRN
  Administered 2020-01-08 (×3): 1 [drp] via OPHTHALMIC

## 2020-01-08 MED ORDER — LIDOCAINE HCL 3.5 % OP GEL
1.0000 "application " | Freq: Once | OPHTHALMIC | Status: AC
  Administered 2020-01-08: 1 via OPHTHALMIC

## 2020-01-08 MED ORDER — LIDOCAINE HCL (PF) 1 % IJ SOLN
INTRAOCULAR | Status: DC | PRN
  Administered 2020-01-08: 1 mL via OPHTHALMIC

## 2020-01-08 MED ORDER — MIDAZOLAM HCL 2 MG/2ML IJ SOLN
INTRAMUSCULAR | Status: DC | PRN
  Administered 2020-01-08: 1 mg via INTRAVENOUS

## 2020-01-08 MED ORDER — EPINEPHRINE PF 1 MG/ML IJ SOLN
INTRAOCULAR | Status: DC | PRN
  Administered 2020-01-08: 500 mL

## 2020-01-08 MED ORDER — POVIDONE-IODINE 5 % OP SOLN
OPHTHALMIC | Status: DC | PRN
  Administered 2020-01-08: 1 via OPHTHALMIC

## 2020-01-08 MED ORDER — CYCLOPENTOLATE-PHENYLEPHRINE 0.2-1 % OP SOLN
1.0000 [drp] | OPHTHALMIC | Status: AC | PRN
  Administered 2020-01-08 (×3): 1 [drp] via OPHTHALMIC

## 2020-01-08 MED ORDER — NEOMYCIN-POLYMYXIN-DEXAMETH 3.5-10000-0.1 OP SUSP
OPHTHALMIC | Status: DC | PRN
  Administered 2020-01-08: 1 [drp] via OPHTHALMIC

## 2020-01-08 SURGICAL SUPPLY — 12 items
CLOTH BEACON ORANGE TIMEOUT ST (SAFETY) ×3 IMPLANT
EYE SHIELD UNIVERSAL CLEAR (GAUZE/BANDAGES/DRESSINGS) ×3 IMPLANT
GLOVE BIOGEL PI IND STRL 7.0 (GLOVE) ×2 IMPLANT
GLOVE BIOGEL PI INDICATOR 7.0 (GLOVE) ×4
LENS ALC ACRYL/TECN (Ophthalmic Related) ×3 IMPLANT
NEEDLE HYPO 18GX1.5 BLUNT FILL (NEEDLE) ×3 IMPLANT
PAD ARMBOARD 7.5X6 YLW CONV (MISCELLANEOUS) ×3 IMPLANT
SYR TB 1ML LL NO SAFETY (SYRINGE) ×3 IMPLANT
TAPE SURG TRANSPORE 1 IN (GAUZE/BANDAGES/DRESSINGS) ×1 IMPLANT
TAPE SURGICAL TRANSPORE 1 IN (GAUZE/BANDAGES/DRESSINGS) ×3
VISCOELASTIC ADDITIONAL (OPHTHALMIC RELATED) ×3 IMPLANT
WATER STERILE IRR 250ML POUR (IV SOLUTION) ×3 IMPLANT

## 2020-01-08 NOTE — Op Note (Signed)
Date of procedure: 01/08/20  Pre-operative diagnosis: Visually significant age-related nuclear cataract, Right Eye (H25.11)  Post-operative diagnosis: Visually significant age-related nuclear cataract, Right Eye  Procedure: Removal of cataract via phacoemulsification and insertion of intra-ocular lens Wynetta Emery and [place]  +22.0D into the capsular bag of the Right Eye  Attending surgeon: Gerda Diss. Mayme Profeta, MD, MA  Anesthesia: MAC, Topical Akten  Complications: None  Estimated Blood Loss: <4m (minimal)  Specimens: None  Implants: As above  Indications:  Visually significant age-related cataract, Right Eye  Procedure:  The patient was seen and identified in the pre-operative area. The operative eye was identified and dilated.  The operative eye was marked.  Topical anesthesia was administered to the operative eye.     The patient was then to the operative suite and placed in the supine position.  A timeout was performed confirming the patient, procedure to be performed, and all other relevant information.   The patient's face was prepped and draped in the usual fashion for intra-ocular surgery.  A lid speculum was placed into the operative eye and the surgical microscope moved into place and focused.  A superotemporal paracentesis was created using a 20 gauge paracentesis blade.  Shugarcaine was injected into the anterior chamber.  Viscoelastic was injected into the anterior chamber.  A temporal clear-corneal main wound incision was created using a 2.459mmicrokeratome.  A continuous curvilinear capsulorrhexis was initiated using an irrigating cystitome and completed using capsulorrhexis forceps.  Hydrodissection and hydrodeliniation were performed.  Viscoelastic was injected into the anterior chamber.  A phacoemulsification handpiece and a chopper as a second instrument were used to remove the nucleus and epinucleus. The irrigation/aspiration handpiece was used to remove any  remaining cortical material.   The capsular bag was reinflated with viscoelastic, checked, and found to be intact.  The intraocular lens was inserted into the capsular bag.  The irrigation/aspiration handpiece was used to remove any remaining viscoelastic.  The clear corneal wound and paracentesis wounds were then hydrated and checked with Weck-Cels to be watertight.  The lid-speculum and drape was removed, and the patient's face was cleaned with a wet and dry 4x4.  Maxitrol was instilled in the eye before a clear shield was taped over the eye. The patient was taken to the post-operative care unit in good condition, having tolerated the procedure well.  Post-Op Instructions: [facility] and will receive all other orders and instructions.

## 2020-01-08 NOTE — Discharge Instructions (Signed)
Please discharge patient when stable, will follow up today with Dr. Katrianna Friesenhahn at the [facility] [place] office immediately following discharge.  Leave shield in place until visit.  All paperwork with discharge instructions will be given at the office.  [facility] Address:  [address]  [place], [place] [address]             Monitored Anesthesia Care, Care After These instructions provide you with information about caring for yourself after your procedure. Your health care provider may also give you more specific instructions. Your treatment has been planned according to current medical practices, but problems sometimes occur. Call your health care provider if you have any problems or questions after your procedure. What can I expect after the procedure? After your procedure, you may:  Feel sleepy for several hours.  Feel clumsy and have poor balance for several hours.  Feel forgetful about what happened after the procedure.  Have poor judgment for several hours.  Feel nauseous or vomit.  Have a sore throat if you had a breathing tube during the procedure. Follow these instructions at home: For at least 24 hours after the procedure:      Have a responsible adult stay with you. It is important to have someone help care for you until you are awake and alert.  Rest as needed.  Do not: ? Participate in activities in which you could fall or become injured. ? Drive. ? Use heavy machinery. ? Drink alcohol. ? Take sleeping pills or medicines that cause drowsiness. ? Make important decisions or sign legal documents. ? Take care of children on your own. Eating and drinking  Follow the diet that is recommended by your health care provider.  If you vomit, drink water, juice, or soup when you can drink without vomiting.  Make sure you have little or no nausea before eating solid foods. General instructions  Take over-the-counter and  prescription medicines only as told by your health care provider.  If you have sleep apnea, surgery and certain medicines can increase your risk for breathing problems. Follow instructions from your health care provider about wearing your sleep device: ? Anytime you are sleeping, including during daytime naps. ? While taking prescription pain medicines, sleeping medicines, or medicines that make you drowsy.  If you smoke, do not smoke without supervision.  Keep all follow-up visits as told by your health care provider. This is important. Contact a health care provider if:  You keep feeling nauseous or you keep vomiting.  You feel light-headed.  You develop a rash.  You have a fever. Get help right away if:  You have trouble breathing. Summary  For several hours after your procedure, you may feel sleepy and have poor judgment.  Have a responsible adult stay with you for at least 24 hours or until you are awake and alert. This information is not intended to replace advice given to you by your health care provider. Make sure you discuss any questions you have with your health care provider. Document Revised: 09/09/2017 Document Reviewed: 10/02/2015 Elsevier Patient Education  2020 Elsevier Inc.  

## 2020-01-08 NOTE — Anesthesia Postprocedure Evaluation (Signed)
Anesthesia Post Note  Patient: Walter Orozco  Procedure(s) Performed: CATARACT EXTRACTION PHACO AND INTRAOCULAR LENS PLACEMENT RIGHT EYE (Right Eye)  Patient location during evaluation: Phase II Anesthesia Type: MAC Level of consciousness: awake, awake and alert, oriented and patient cooperative Pain management: pain level controlled Vital Signs Assessment: post-procedure vital signs reviewed and stable Respiratory status: spontaneous breathing, respiratory function stable and nonlabored ventilation Cardiovascular status: blood pressure returned to baseline and stable Anesthetic complications: no   No complications documented.   Last Vitals:  Vitals:   01/08/20 0857  BP: 126/76  Pulse: 71  Resp: 20  Temp: 36.7 C  SpO2: 98%    Last Pain:  Vitals:   01/08/20 0857  TempSrc: Oral                 Josemiguel Gries L

## 2020-01-08 NOTE — Transfer of Care (Signed)
Immediate Anesthesia Transfer of Care Note  Patient: Walter Orozco  Procedure(s) Performed: CATARACT EXTRACTION PHACO AND INTRAOCULAR LENS PLACEMENT RIGHT EYE (Right Eye)  Patient Location: PACU  Anesthesia Type:MAC  Level of Consciousness: awake, alert , oriented and patient cooperative  Airway & Oxygen Therapy: Patient Spontanous Breathing  Post-op Assessment: Report given to RN and Post -op Vital signs reviewed and stable  Post vital signs: Reviewed and stable  Last Vitals:  Vitals Value Taken Time  BP 100/81   Temp 98.2   Pulse 74   Resp    SpO2 96%     Last Pain:  Vitals:   01/08/20 0857  TempSrc: Oral         Complications: No complications documented.

## 2020-01-08 NOTE — Anesthesia Preprocedure Evaluation (Signed)
Anesthesia Evaluation  Patient identified by MRN, date of birth, ID band Patient awake    Reviewed: Allergy & Precautions, NPO status , Patient's Chart, lab work & pertinent test results  Airway Mallampati: II  TM Distance: >3 FB Neck ROM: Full    Dental no notable dental hx.    Pulmonary neg pulmonary ROS, former smoker,    Pulmonary exam normal breath sounds clear to auscultation       Cardiovascular hypertension, Pt. on medications negative cardio ROS Normal cardiovascular exam Rhythm:Regular Rate:Normal     Neuro/Psych negative neurological ROS  negative psych ROS   GI/Hepatic negative GI ROS, Neg liver ROS,   Endo/Other  negative endocrine ROS  Renal/GU negative Renal ROS  negative genitourinary   Musculoskeletal  (+) Arthritis , Osteoarthritis,    Abdominal   Peds negative pediatric ROS (+)  Hematology negative hematology ROS (+)   Anesthesia Other Findings   Reproductive/Obstetrics negative OB ROS                             Anesthesia Physical  Anesthesia Plan  ASA: II  Anesthesia Plan: MAC   Post-op Pain Management:    Induction: Intravenous  PONV Risk Score and Plan: 2 and Ondansetron and Midazolam  Airway Management Planned:   Additional Equipment:   Intra-op Plan:   Post-operative Plan:   Informed Consent: I have reviewed the patients History and Physical, chart, labs and discussed the procedure including the risks, benefits and alternatives for the proposed anesthesia with the patient or authorized representative who has indicated his/her understanding and acceptance.     Dental advisory given  Plan Discussed with: CRNA  Anesthesia Plan Comments:         Anesthesia Quick Evaluation

## 2020-01-08 NOTE — Interval H&P Note (Signed)
History and Physical Interval Note:  01/08/2020 9:40 AM  Walter Orozco  has presented today for surgery, with the diagnosis of Nuclear sclerotic cataract - Right eye.  The various methods of treatment have been discussed with the patient and family. After consideration of risks, benefits and other options for treatment, the patient has consented to  Procedure(s) with comments: CATARACT EXTRACTION PHACO AND INTRAOCULAR LENS PLACEMENT (IOC) (Right) - right as a surgical intervention.  The patient's history has been reviewed, patient examined, no change in status, stable for surgery.  I have reviewed the patient's chart and labs.  Questions were answered to the patient's satisfaction.     Baruch Goldmann

## 2020-01-11 ENCOUNTER — Encounter (HOSPITAL_COMMUNITY): Payer: Self-pay | Admitting: Ophthalmology

## 2020-01-29 ENCOUNTER — Other Ambulatory Visit (HOSPITAL_COMMUNITY): Payer: Self-pay | Admitting: *Deleted

## 2020-01-29 DIAGNOSIS — C187 Malignant neoplasm of sigmoid colon: Secondary | ICD-10-CM

## 2020-02-01 ENCOUNTER — Inpatient Hospital Stay (HOSPITAL_COMMUNITY): Payer: Medicare HMO | Attending: Hematology

## 2020-02-01 ENCOUNTER — Other Ambulatory Visit: Payer: Self-pay

## 2020-02-01 DIAGNOSIS — I10 Essential (primary) hypertension: Secondary | ICD-10-CM | POA: Insufficient documentation

## 2020-02-01 DIAGNOSIS — E7800 Pure hypercholesterolemia, unspecified: Secondary | ICD-10-CM | POA: Diagnosis not present

## 2020-02-01 DIAGNOSIS — Z933 Colostomy status: Secondary | ICD-10-CM | POA: Diagnosis not present

## 2020-02-01 DIAGNOSIS — Z79899 Other long term (current) drug therapy: Secondary | ICD-10-CM | POA: Diagnosis not present

## 2020-02-01 DIAGNOSIS — C187 Malignant neoplasm of sigmoid colon: Secondary | ICD-10-CM

## 2020-02-01 DIAGNOSIS — M129 Arthropathy, unspecified: Secondary | ICD-10-CM | POA: Insufficient documentation

## 2020-02-01 DIAGNOSIS — Z87891 Personal history of nicotine dependence: Secondary | ICD-10-CM | POA: Diagnosis not present

## 2020-02-01 LAB — CBC WITH DIFFERENTIAL/PLATELET
Abs Immature Granulocytes: 0.01 10*3/uL (ref 0.00–0.07)
Basophils Absolute: 0 10*3/uL (ref 0.0–0.1)
Basophils Relative: 0 %
Eosinophils Absolute: 0.1 10*3/uL (ref 0.0–0.5)
Eosinophils Relative: 2 %
HCT: 45.8 % (ref 39.0–52.0)
Hemoglobin: 15.4 g/dL (ref 13.0–17.0)
Immature Granulocytes: 0 %
Lymphocytes Relative: 29 %
Lymphs Abs: 2 10*3/uL (ref 0.7–4.0)
MCH: 31.1 pg (ref 26.0–34.0)
MCHC: 33.6 g/dL (ref 30.0–36.0)
MCV: 92.5 fL (ref 80.0–100.0)
Monocytes Absolute: 0.6 10*3/uL (ref 0.1–1.0)
Monocytes Relative: 9 %
Neutro Abs: 4.2 10*3/uL (ref 1.7–7.7)
Neutrophils Relative %: 60 %
Platelets: 183 10*3/uL (ref 150–400)
RBC: 4.95 MIL/uL (ref 4.22–5.81)
RDW: 12.8 % (ref 11.5–15.5)
WBC: 7.1 10*3/uL (ref 4.0–10.5)
nRBC: 0 % (ref 0.0–0.2)

## 2020-02-01 LAB — FERRITIN: Ferritin: 41 ng/mL (ref 24–336)

## 2020-02-01 LAB — COMPREHENSIVE METABOLIC PANEL
ALT: 22 U/L (ref 0–44)
AST: 18 U/L (ref 15–41)
Albumin: 4.2 g/dL (ref 3.5–5.0)
Alkaline Phosphatase: 67 U/L (ref 38–126)
Anion gap: 9 (ref 5–15)
BUN: 12 mg/dL (ref 8–23)
CO2: 26 mmol/L (ref 22–32)
Calcium: 9.5 mg/dL (ref 8.9–10.3)
Chloride: 104 mmol/L (ref 98–111)
Creatinine, Ser: 0.81 mg/dL (ref 0.61–1.24)
GFR calc Af Amer: >60 mL/min (ref >60)
GFR calc non Af Amer: >60 mL/min (ref >60)
Glucose, Bld: 105 mg/dL — ABNORMAL HIGH (ref 70–99)
Potassium: 4.4 mmol/L (ref 3.5–5.1)
Sodium: 139 mmol/L (ref 135–145)
Total Bilirubin: 0.7 mg/dL (ref 0.3–1.2)
Total Protein: 7.1 g/dL (ref 6.5–8.1)

## 2020-02-02 LAB — CEA: CEA: 1.5 ng/mL (ref 0.0–4.7)

## 2020-02-08 ENCOUNTER — Other Ambulatory Visit: Payer: Self-pay

## 2020-02-08 ENCOUNTER — Inpatient Hospital Stay (HOSPITAL_COMMUNITY): Payer: Medicare HMO | Admitting: Hematology

## 2020-02-08 VITALS — BP 115/64 | HR 83 | Temp 98.3°F | Resp 18 | Wt 173.4 lb

## 2020-02-08 DIAGNOSIS — C187 Malignant neoplasm of sigmoid colon: Secondary | ICD-10-CM

## 2020-02-08 NOTE — Progress Notes (Signed)
[facility], [place] [address]   CLINIC:  Medical Oncology/Hematology  PCP:  Lucia Gaskins, [place] / [place] [address] [phone]   REASON FOR VISIT:  Follow-up for sigmoid colon cancer  PRIOR THERAPY:  1. Sigmoid resection on 07/25/2005 by Dr. Romona Curls and Dr. Michela Pitcher. 2. Chemoradiation with oxaliplatin and bevacizumab.  NGS Results: Not done  CURRENT THERAPY: Observation  BRIEF ONCOLOGIC HISTORY:  Oncology History   No history exists.    CANCER STAGING: Cancer Staging Adenocarcinoma of sigmoid colon [facility]) Staging form: Colon and Rectum, AJCC 7th Edition - Clinical: Stage IIIC (T4b, N1, M0) - Signed by Baird Cancer, PA on 11/26/2011   INTERVAL HISTORY:  [name], a 74 y.o. male, returns for routine follow-up of his sigmoid colon cancer. Walter Orozco was last seen on 01/20/2018 with Dr. Mathis Dad Higgs.   Today he reports that he is feeling well. His BM's are regular, he denies having any new pains and denies having melena or hematochezia.  His last colonoscopy was on 02/29/2016 with Dr. Laural Golden which was negative and to be repeated every 5 years.   REVIEW OF SYSTEMS:  Review of Systems  Constitutional: Negative for appetite change and fatigue.  Gastrointestinal: Negative for blood in stool.  Neurological: Positive for numbness (feet).  All other systems reviewed and are negative.   PAST MEDICAL/SURGICAL HISTORY:  Past Medical History:  Diagnosis Date  . Adenocarcinoma of sigmoid colon ([facility]) 06/11/2011  . Arthritis   . High cholesterol   . Hypertension    Past Surgical History:  Procedure Laterality Date  . CATARACT EXTRACTION W/PHACO Left 12/23/2019   Procedure: CATARACT EXTRACTION PHACO AND INTRAOCULAR LENS PLACEMENT LEFT EYE;  Surgeon: Baruch Goldmann, MD;  Location: [facility] ORS;  Service: Ophthalmology;  Laterality: Left;  CDE: 14.68  . CATARACT EXTRACTION W/PHACO Right 01/08/2020   Procedure:  CATARACT EXTRACTION PHACO AND INTRAOCULAR LENS PLACEMENT RIGHT EYE;  Surgeon: Baruch Goldmann, MD;  Location: [facility] ORS;  Service: Ophthalmology;  Laterality: Right;  CDE: 18.60  . COLON SURGERY    . COLONOSCOPY  07/10/2012   Procedure: COLONOSCOPY;  Surgeon: Rogene Houston, MD;  Location: [facility] SUITE;  Service: Endoscopy;  Laterality: N/A;  1200  . COLONOSCOPY N/A 02/29/2016   Procedure: COLONOSCOPY;  Surgeon: Rogene Houston, MD;  Location: [facility];  Service: Endoscopy;  Laterality: N/A;  1200 - moved to 9/6 @ 10:30  . TOTAL HIP ARTHROPLASTY Right 06/02/2019   Procedure: TOTAL HIP ARTHROPLASTY ANTERIOR APPROACH;  Surgeon: Paralee Cancel, MD;  Location: [facility];  Service: Orthopedics;  Laterality: Right;  70 mins    SOCIAL HISTORY:  Social History   Socioeconomic History  . Marital status: Married    Spouse name: Not on file  . Number of children: Not on file  . Years of education: Not on file  . Highest education level: Not on file  Occupational History  . Not on file  Tobacco Use  . Smoking status: Former Smoker    Types: Cigarettes    Quit date: 01/22/1973    Years since quitting: 47.0  . Smokeless tobacco: Former Systems developer    Types: Chew, Snuff  Vaping Use  . Vaping Use: Never used  Substance and Sexual Activity  . Alcohol use: Yes    Comment: occasional glass of wine or beer  . Drug use: No  . Sexual activity: Not on file  Other Topics Concern  . Not  on file  Social History Narrative  . Not on file   Social Determinants of Health   Financial Resource Strain:   . Difficulty of Paying Living Expenses:   Food Insecurity:   . Worried About Charity fundraiser in the Last Year:   . Arboriculturist in the Last Year:   Transportation Needs:   . Film/video editor (Medical):   Walter Orozco Kitchen Lack of Transportation (Non-Medical):   Physical Activity:   . Days of Exercise per Week:   . Minutes of Exercise per Session:   Stress:   . Feeling of Stress :   Social Connections:   .  Frequency of Communication with Friends and Family:   . Frequency of Social Gatherings with Friends and Family:   . Attends Religious Services:   . Active Member of Clubs or Organizations:   . Attends Archivist Meetings:   Walter Orozco Kitchen Marital Status:   Intimate Partner Violence:   . Fear of Current or Ex-Partner:   . Emotionally Abused:   Walter Orozco Kitchen Physically Abused:   . Sexually Abused:     FAMILY HISTORY:  Family History  Problem Relation Age of Onset  . Cancer Father     CURRENT MEDICATIONS:  Current Outpatient Medications  Medication Sig Dispense Refill  . amLODipine (NORVASC) 10 MG tablet Take 10 mg by mouth daily.    . Cholecalciferol (VITAMIN D3) 125 MCG (5000 UT) CAPS Take 5,000 Units by mouth daily.    Walter Orozco Kitchen lisinopril (PRINIVIL,ZESTRIL) 10 MG tablet Take 10 mg by mouth daily.    . simvastatin (ZOCOR) 40 MG tablet Take 40 mg by mouth daily.    . tamsulosin (FLOMAX) 0.4 MG CAPS capsule Take 0.4 mg by mouth daily.     No current facility-administered medications for this visit.   Facility-Administered Medications Ordered in Other Visits  Medication Dose Route Frequency Provider Last Rate Last Admin  . neomycin-polymyxin b-dexamethasone (MAXITROL) ophthalmic suspension    PRN Baruch Goldmann, MD   1 drop at 12/23/19 1332    ALLERGIES:  No Known Allergies  PHYSICAL EXAM:  Performance status (ECOG): 0 - Asymptomatic  Vitals:   02/08/20 1123  BP: 115/64  Pulse: 83  Resp: 18  Temp: 98.3 F (36.8 C)  SpO2: 100%   Wt Readings from Last 3 Encounters:  02/08/20 173 lb 6.4 oz (78.7 kg)  12/23/19 168 lb 8 oz (76.4 kg)  06/02/19 179 lb 6.4 oz (81.4 kg)   Physical Exam Vitals reviewed.  Constitutional:      Appearance: Normal appearance.  Cardiovascular:     Rate and Rhythm: Normal rate and regular rhythm.     Pulses: Normal pulses.     Heart sounds: Normal heart sounds.  Pulmonary:     Effort: Pulmonary effort is normal.     Breath sounds: Normal breath sounds.    Abdominal:     Palpations: Abdomen is soft. There is no mass.     Tenderness: There is no abdominal tenderness.     Hernia: No hernia is present.     Comments: Colostomy bag  Neurological:     General: No focal deficit present.     Mental Status: He is alert and oriented to person, place, and time.  Psychiatric:        Mood and Affect: Mood normal.        Behavior: Behavior normal.      LABORATORY DATA:  I have reviewed the labs as listed.  CBC Latest  Ref Rng & Units 02/01/2020 05/29/2019 01/19/2019  WBC 4.0 - 10.5 K/uL 7.1 7.9 6.6  Hemoglobin 13.0 - 17.0 g/dL 15.4 16.6 16.2  Hematocrit 39 - 52 % 45.8 49.0 48.7  Platelets 150 - 400 K/uL 183 222 185   CMP Latest Ref Rng & Units 02/01/2020 05/29/2019 01/19/2019  Glucose 70 - 99 mg/dL 105(H) 103(H) 99  BUN 8 - 23 mg/dL 12 12 13   Creatinine 0.61 - 1.24 mg/dL 0.81 0.75 0.85  Sodium 135 - 145 mmol/L 139 139 140  Potassium 3.5 - 5.1 mmol/L 4.4 4.2 4.3  Chloride 98 - 111 mmol/L 104 103 105  CO2 22 - 32 mmol/L 26 24 25   Calcium 8.9 - 10.3 mg/dL 9.5 9.8 9.5  Total Protein 6.5 - 8.1 g/dL 7.1 - 7.3  Total Bilirubin 0.3 - 1.2 mg/dL 0.7 - 0.7  Alkaline Phos 38 - 126 U/L 67 - 79  AST 15 - 41 U/L 18 - 18  ALT 0 - 44 U/L 22 - 24   Lab Results  Component Value Date   CEA1 1.5 02/01/2020   CEA1 1.5 01/19/2019   CEA1 1.3 01/16/2018   Lab Results  Component Value Date   FERRITIN 41 02/01/2020   FERRITIN 69 01/19/2019   FERRITIN 130 11/24/2013    DIAGNOSTIC IMAGING:  I have independently reviewed the scans and discussed with the patient. No results found.   ASSESSMENT:  1.  Adenocarcinoma of sigmoid colon:  1.  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  Chemoradiation therapy followed by oxaliplatin-based chemo with bevacizumab. -Last colonoscopy on 02/29/2016 demonstrating diverticulosis, diversion colitis.  Repeat colonoscopy in 5 years  recommended.   PLAN:  1.  Adenocarcinoma of sigmoid colon: -He does not report any change in bowel habits.  No blood in the colostomy bag. -I have reviewed labs from 02/01/2020 which showed normal CEA.  LFTs were normal. -I plan to see him back in 1 year for follow-up with repeat labs.   Orders placed this encounter:  No orders of the defined types were placed in this encounter.    Derek Jack, MD [place] [phone]   I, Milinda Antis, am acting as a scribe for Dr. Sanda Linger.  I, Derek Jack MD, have reviewed the above documentation for accuracy and completeness, and I agree with the above.

## 2020-02-08 NOTE — Patient Instructions (Signed)
[facility] at [facility] Discharge Instructions  You were seen today by Dr. Katragadda. He went over your recent results. Dr. Katragadda will see you back in 1 year for labs and follow up.   Thank you for choosing [facility] at [facility] to provide your oncology and hematology care.  To afford each patient quality time with our provider, please arrive at least 15 minutes before your scheduled appointment time.   If you have a lab appointment with the Cancer Center please come in thru the Main Entrance and check in at the main information desk  You need to re-schedule your appointment should you arrive 10 or more minutes late.  We strive to give you quality time with our providers, and arriving late affects you and other patients whose appointments are after yours.  Also, if you no show three or more times for appointments you may be dismissed from the clinic at the providers discretion.     Again, thank you for choosing [facility].  Our hope is that these requests will decrease the amount of time that you wait before being seen by our physicians.       _____________________________________________________________  Should you have questions after your visit to [facility], please contact our office at [phone] between the hours of 8:00 a.m. and 4:30 p.m.  Voicemails left after 4:00 p.m. will not be returned until the following business day.  For prescription refill requests, have your pharmacy contact our office and allow 72 hours.    Cancer Center Support Programs:   > Cancer Support Group  2nd Tuesday of the month 1pm-2pm, Journey Room    

## 2020-03-19 ENCOUNTER — Encounter: Payer: Self-pay | Admitting: Emergency Medicine

## 2020-03-19 ENCOUNTER — Ambulatory Visit
Admission: EM | Admit: 2020-03-19 | Discharge: 2020-03-19 | Disposition: A | Discharge status: Home / Self Care | Payer: Medicare HMO | Attending: Emergency Medicine | Admitting: Emergency Medicine

## 2020-03-19 DIAGNOSIS — W57XXXA Bitten or stung by nonvenomous insect and other nonvenomous arthropods, initial encounter: Secondary | ICD-10-CM | POA: Diagnosis not present

## 2020-03-19 DIAGNOSIS — L539 Erythematous condition, unspecified: Secondary | ICD-10-CM | POA: Diagnosis not present

## 2020-03-19 MED ORDER — DOXYCYCLINE HYCLATE 100 MG PO CAPS
200.0000 mg | ORAL_CAPSULE | Freq: Once | ORAL | 0 refills | Status: AC
Start: 2020-03-19 — End: 2020-03-19

## 2020-03-19 NOTE — ED Triage Notes (Signed)
Reddened area between LT great toe and 2nd toe that he noticed this morning. Pt reports he had a tick on him Monday

## 2020-03-19 NOTE — Discharge Instructions (Addendum)
Prescribed single prophylactic dose of doxycycline 200 mg To prevent tick bites, wear long sleeves, long pants, and light colors. Use insect repellent. Follow the instructions on the bottle. If the tick is biting, do not try to remove it with heat, alcohol, petroleum jelly, or fingernail polish. Use tweezers, curved forceps, or a tick-removal tool to grasp the tick. Gently pull up until the tick lets go. Do not twist or jerk the tick. Do not squeeze or crush the tick. Return here or go to ER if you have any new or worsening symptoms (rash, nausea, vomiting, fever, chills, headache, fatigue

## 2020-03-19 NOTE — ED Provider Notes (Signed)
[place]   [id number] 03/19/20 Arrival Time: 1117   Chief Complaint  Patient presents with  . Insect Bite     SUBJECTIVE: History from: patient.  Walter Orozco is a 74 y.o. male presented to the urgent care for complaint of tick bite for the past 4 days.  Denies a precipitating event, noticed while changing clothes.  Localizes the bite to to his feet between great toe and second toe.  Has remove the thick.  Report previous hx of tick bite.  Denies fever, chills, nausea, vomiting, headache, dizziness, weakness, fatigue, rash, or abdominal pain.   ROS: As per HPI.  All other pertinent ROS negative.     Past Medical History:  Diagnosis Date  . 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 Arthritis   . High cholesterol   . Hypertension    Past Surgical History:  Procedure Laterality Date  . CATARACT EXTRACTION W/PHACO Left 12/23/2019   Procedure: CATARACT EXTRACTION PHACO AND INTRAOCULAR LENS PLACEMENT LEFT EYE;  Surgeon: Baruch Goldmann, MD;  Location: [facility];  Service: Ophthalmology;  Laterality: Left;  CDE: 14.68  . CATARACT EXTRACTION W/PHACO Right 01/08/2020   Procedure: CATARACT EXTRACTION PHACO AND INTRAOCULAR LENS PLACEMENT RIGHT EYE;  Surgeon: Baruch Goldmann, MD;  Location: [facility];  Service: Ophthalmology;  Laterality: Right;  CDE: 18.60  . COLON SURGERY    . COLONOSCOPY  07/10/2012   Procedure: COLONOSCOPY;  Surgeon: Rogene Houston, MD;  Location: [facility];  Service: Endoscopy;  Laterality: N/A;  1200  . COLONOSCOPY N/A 02/29/2016   Procedure: COLONOSCOPY;  Surgeon: Rogene Houston, MD;  Location: [facility];  Service: Endoscopy;  Laterality: N/A;  1200 - moved to 9/6 @ 10:30  . TOTAL HIP ARTHROPLASTY Right 06/02/2019   Procedure: TOTAL HIP ARTHROPLASTY ANTERIOR APPROACH;  Surgeon: Paralee Cancel, MD;  Location: [facility];  Service: Orthopedics;  Laterality: Right;  70 mins   No Known Allergies Current Facility-Administered Medications on File Prior  to Encounter  Medication Dose Route Frequency Provider Last Rate Last Admin  . neomycin-polymyxin b-dexamethasone (MAXITROL) ophthalmic suspension    PRN Baruch Goldmann, MD   1 drop at 12/23/19 1332   Current Outpatient Medications on File Prior to Encounter  Medication Sig Dispense Refill  . amLODipine (NORVASC) 10 MG tablet Take 10 mg by mouth daily.    . Cholecalciferol (VITAMIN D3) 125 MCG (5000 UT) CAPS Take 5,000 Units by mouth daily.    Marland Kitchen lisinopril (PRINIVIL,ZESTRIL) 10 MG tablet Take 10 mg by mouth daily.    . simvastatin (ZOCOR) 40 MG tablet Take 40 mg by mouth daily.    . tamsulosin (FLOMAX) 0.4 MG CAPS capsule Take 0.4 mg by mouth daily.     Social History   Socioeconomic History  . Marital status: Married    Spouse name: Not on file  . Number of children: Not on file  . Years of education: Not on file  . Highest education level: Not on file  Occupational History  . Not on file  Tobacco Use  . Smoking status: Former Smoker    Types: Cigarettes    Quit date: 01/22/1973    Years since quitting: 47.1  . Smokeless tobacco: Former Systems developer    Types: Chew, Snuff  Vaping Use  . Vaping Use: Never used  Substance and Sexual Activity  . Alcohol use: Yes    Comment: occasional glass of wine or beer  . Drug use: No  . Sexual activity: Not  on file  Other Topics Concern  . Not on file  Social History Narrative  . Not on file   Social Determinants of Health   Financial Resource Strain:   . Difficulty of Paying Living Expenses: Not on file  Food Insecurity:   . Worried About Charity fundraiser in the Last Year: Not on file  . Ran Out of Food in the Last Year: Not on file  Transportation Needs:   . Lack of Transportation (Medical): Not on file  . Lack of Transportation (Non-Medical): Not on file  Physical Activity:   . Days of Exercise per Week: Not on file  . Minutes of Exercise per Session: Not on file  Stress:   . Feeling of Stress : Not on file  Social Connections:    . Frequency of Communication with Friends and Family: Not on file  . Frequency of Social Gatherings with Friends and Family: Not on file  . Attends Religious Services: Not on file  . Active Member of Clubs or Organizations: Not on file  . Attends Archivist Meetings: Not on file  . Marital Status: Not on file  Intimate Partner Violence:   . Fear of Current or Ex-Partner: Not on file  . Emotionally Abused: Not on file  . Physically Abused: Not on file  . Sexually Abused: Not on file   Family History  Problem Relation Age of Onset  . Cancer Father     OBJECTIVE:  Vitals:   03/19/20 1203  BP: 127/77  Pulse: 94  Resp: 18  Temp: 98.6 F (37 C)  TempSrc: Oral  SpO2: 94%     Physical Exam Vitals and nursing note reviewed.  Constitutional:      General: He is not in acute distress.    Appearance: Normal appearance. He is normal weight. He is not ill-appearing, toxic-appearing or diaphoretic.  Cardiovascular:     Rate and Rhythm: Normal rate and regular rhythm.     Pulses: Normal pulses.     Heart sounds: Normal heart sounds. No murmur heard.  No friction rub. No gallop.   Pulmonary:     Effort: Pulmonary effort is normal. No respiratory distress.     Breath sounds: Normal breath sounds. No stridor. No wheezing, rhonchi or rales.  Chest:     Chest wall: No tenderness.  Skin:    General: Skin is warm.     Findings: Erythema present.  Neurological:     Mental Status: He is alert and oriented to person, place, and time.     LABS:  No results found for this or any previous visit (from the past 24 hour(s)).   ASSESSMENT & PLAN:  1. Tick bite, initial encounter   2. Erythema     No orders of the defined types were placed in this encounter.   Discharge instructions  Prescribed single prophylactic dose of doxycycline 200 mg To prevent tick bites, wear long sleeves, long pants, and light colors. Use insect repellent. Follow the instructions on the  bottle. If the tick is biting, do not try to remove it with heat, alcohol, petroleum jelly, or fingernail polish. Use tweezers, curved forceps, or a tick-removal tool to grasp the tick. Gently pull up until the tick lets go. Do not twist or jerk the tick. Do not squeeze or crush the tick. Return here or go to ER if you have any new or worsening symptoms (rash, nausea, vomiting, fever, chills, headache, fatigue)   Reviewed expectations re:  course of current medical issues. Questions answered. Outlined signs and symptoms indicating need for more acute intervention. Patient verbalized understanding. After Visit Summary given.         Emerson Monte, [place] 03/19/20 1241

## 2020-07-19 DIAGNOSIS — M2559 Pain in other specified joint: Secondary | ICD-10-CM | POA: Diagnosis not present

## 2020-07-19 DIAGNOSIS — I119 Hypertensive heart disease without heart failure: Secondary | ICD-10-CM | POA: Diagnosis not present

## 2020-07-19 DIAGNOSIS — R5382 Chronic fatigue, unspecified: Secondary | ICD-10-CM | POA: Diagnosis not present

## 2020-07-21 DIAGNOSIS — R5383 Other fatigue: Secondary | ICD-10-CM | POA: Diagnosis not present

## 2020-07-21 DIAGNOSIS — R972 Elevated prostate specific antigen [PSA]: Secondary | ICD-10-CM | POA: Diagnosis not present

## 2020-07-21 DIAGNOSIS — D510 Vitamin B12 deficiency anemia due to intrinsic factor deficiency: Secondary | ICD-10-CM | POA: Diagnosis not present

## 2020-07-21 DIAGNOSIS — E7849 Other hyperlipidemia: Secondary | ICD-10-CM | POA: Diagnosis not present

## 2020-07-21 DIAGNOSIS — E559 Vitamin D deficiency, unspecified: Secondary | ICD-10-CM | POA: Diagnosis not present

## 2020-08-16 DIAGNOSIS — Z85038 Personal history of other malignant neoplasm of large intestine: Secondary | ICD-10-CM | POA: Diagnosis not present

## 2020-08-16 DIAGNOSIS — Z933 Colostomy status: Secondary | ICD-10-CM | POA: Diagnosis not present

## 2020-09-05 DIAGNOSIS — M25562 Pain in left knee: Secondary | ICD-10-CM | POA: Diagnosis not present

## 2020-09-05 DIAGNOSIS — M1711 Unilateral primary osteoarthritis, right knee: Secondary | ICD-10-CM | POA: Diagnosis not present

## 2020-09-05 DIAGNOSIS — M170 Bilateral primary osteoarthritis of knee: Secondary | ICD-10-CM | POA: Diagnosis not present

## 2020-09-05 DIAGNOSIS — M1712 Unilateral primary osteoarthritis, left knee: Secondary | ICD-10-CM | POA: Diagnosis not present

## 2020-09-13 DIAGNOSIS — D225 Melanocytic nevi of trunk: Secondary | ICD-10-CM | POA: Diagnosis not present

## 2020-09-13 DIAGNOSIS — D485 Neoplasm of uncertain behavior of skin: Secondary | ICD-10-CM | POA: Diagnosis not present

## 2020-09-13 DIAGNOSIS — Z85820 Personal history of malignant melanoma of skin: Secondary | ICD-10-CM | POA: Diagnosis not present

## 2020-09-13 DIAGNOSIS — D239 Other benign neoplasm of skin, unspecified: Secondary | ICD-10-CM | POA: Diagnosis not present

## 2020-09-13 DIAGNOSIS — L570 Actinic keratosis: Secondary | ICD-10-CM | POA: Diagnosis not present

## 2020-09-13 DIAGNOSIS — Z1283 Encounter for screening for malignant neoplasm of skin: Secondary | ICD-10-CM | POA: Diagnosis not present

## 2020-09-22 DIAGNOSIS — L988 Other specified disorders of the skin and subcutaneous tissue: Secondary | ICD-10-CM | POA: Diagnosis not present

## 2020-09-22 DIAGNOSIS — D485 Neoplasm of uncertain behavior of skin: Secondary | ICD-10-CM | POA: Diagnosis not present

## 2020-10-18 DIAGNOSIS — E7849 Other hyperlipidemia: Secondary | ICD-10-CM | POA: Diagnosis not present

## 2020-10-18 DIAGNOSIS — I110 Hypertensive heart disease with heart failure: Secondary | ICD-10-CM | POA: Diagnosis not present

## 2020-11-18 DIAGNOSIS — Z933 Colostomy status: Secondary | ICD-10-CM | POA: Diagnosis not present

## 2020-11-18 DIAGNOSIS — Z85038 Personal history of other malignant neoplasm of large intestine: Secondary | ICD-10-CM | POA: Diagnosis not present

## 2020-12-07 DIAGNOSIS — M25562 Pain in left knee: Secondary | ICD-10-CM | POA: Diagnosis not present

## 2020-12-07 DIAGNOSIS — M1711 Unilateral primary osteoarthritis, right knee: Secondary | ICD-10-CM | POA: Diagnosis not present

## 2020-12-07 DIAGNOSIS — M1712 Unilateral primary osteoarthritis, left knee: Secondary | ICD-10-CM | POA: Diagnosis not present

## 2020-12-07 DIAGNOSIS — M170 Bilateral primary osteoarthritis of knee: Secondary | ICD-10-CM | POA: Diagnosis not present

## 2020-12-22 ENCOUNTER — Other Ambulatory Visit: Payer: Self-pay

## 2020-12-22 ENCOUNTER — Ambulatory Visit (INDEPENDENT_AMBULATORY_CARE_PROVIDER_SITE_OTHER): Payer: Medicare HMO | Admitting: Urology

## 2020-12-22 ENCOUNTER — Encounter: Payer: Self-pay | Admitting: Urology

## 2020-12-22 VITALS — BP 115/84 | HR 105 | Wt 169.0 lb

## 2020-12-22 DIAGNOSIS — Z87442 Personal history of urinary calculi: Secondary | ICD-10-CM | POA: Diagnosis not present

## 2020-12-22 DIAGNOSIS — N401 Enlarged prostate with lower urinary tract symptoms: Secondary | ICD-10-CM

## 2020-12-22 DIAGNOSIS — N138 Other obstructive and reflux uropathy: Secondary | ICD-10-CM | POA: Diagnosis not present

## 2020-12-22 DIAGNOSIS — R351 Nocturia: Secondary | ICD-10-CM | POA: Diagnosis not present

## 2020-12-22 DIAGNOSIS — R972 Elevated prostate specific antigen [PSA]: Secondary | ICD-10-CM | POA: Diagnosis not present

## 2020-12-22 LAB — URINALYSIS, ROUTINE W REFLEX MICROSCOPIC
Bilirubin, UA: NEGATIVE
Glucose, UA: NEGATIVE
Ketones, UA: NEGATIVE
Leukocytes,UA: NEGATIVE
Nitrite, UA: NEGATIVE
Protein,UA: NEGATIVE
Specific Gravity, UA: 1.015 (ref 1.005–1.030)
Urobilinogen, Ur: 0.2 mg/dL (ref 0.2–1.0)
pH, UA: 6 (ref 5.0–7.5)

## 2020-12-22 NOTE — Progress Notes (Signed)
Urological Symptom Review  Patient is experiencing the following symptoms: Get up at night to urinate   Review of Systems  Gastrointestinal (upper)  : Negative for upper GI symptoms  Gastrointestinal (lower) : Negative for lower GI symptoms  Constitutional : Negative for symptoms  Skin: Negative for skin symptoms  Eyes: Negative for eye symptoms  Ear/Nose/Throat : Negative for Ear/Nose/Throat symptoms  Hematologic/Lymphatic: Easy bruising  Cardiovascular : Negative for cardiovascular symptoms  Respiratory : Negative for respiratory symptoms  Endocrine: Negative for endocrine symptoms  Musculoskeletal: Negative for musculoskeletal symptoms  Neurological: Negative for neurological symptoms  Psychologic: Negative for psychiatric symptoms

## 2020-12-22 NOTE — Progress Notes (Signed)
Subjective: 1. Elevated PSA   2. Enlarged prostate with urinary obstruction   3. Nocturia   4. History of nephrolithiasis      [name] returns for overdue f/u.  He was last seen in 11/19 for a history of an elevated PSA and his PSA then was 4.4.  His PSA had been above 5 in the past.   Since then his PSA has risen slowly and was 4.5 in 8/20, 5.0 in 7/21 and 5.21 on 07/21/20 which was stable.  His UA is unremarkable. He also has a history of colon cancer with bladder invasion that required resection followed by chemo and RTx.  He remains in remission with a low CEA.    He has a colostomy.  He has a history of stones.  He is voiding well and remains on tamsulosin.  He just got the scripts refilled.  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   Arthritis    High cholesterol    Hypertension     Past Surgical History:  Procedure Laterality Date   CATARACT EXTRACTION W/PHACO Left 12/23/2019   Procedure: CATARACT EXTRACTION PHACO AND INTRAOCULAR LENS PLACEMENT LEFT EYE;  Surgeon: Baruch Goldmann, MD;  Location: [facility];  Service: Ophthalmology;  Laterality: Left;  CDE: 14.68   CATARACT EXTRACTION W/PHACO Right 01/08/2020   Procedure: CATARACT EXTRACTION PHACO AND INTRAOCULAR LENS PLACEMENT RIGHT EYE;  Surgeon: Baruch Goldmann, MD;  Location: [facility];  Service: Ophthalmology;  Laterality: Right;  CDE: 18.60   COLON SURGERY     COLONOSCOPY  07/10/2012   Procedure: COLONOSCOPY;  Surgeon: Rogene Houston, MD;  [facility];  Service: Endoscopy;  Laterality: N/A;  1200   COLONOSCOPY N/A 02/29/2016   Procedure: COLONOSCOPY;  Surgeon: Rogene Houston, MD;  [facility];  Service: Endoscopy;  Laterality: N/A;  1200 - moved to 9/6 @ 10:30   TOTAL HIP ARTHROPLASTY Right 06/02/2019   Procedure: TOTAL HIP ARTHROPLASTY ANTERIOR APPROACH;  Surgeon: Paralee Cancel, MD;  Location: [facility];  Service: Orthopedics;   Laterality: Right;  70 mins    Social History   Socioeconomic History   Marital status: Married    Spouse name: Not on file   Number of children: Not on file   Years of education: Not on file   Highest education level: Not on file  Occupational History   Not on file  Tobacco Use   Smoking status: Former    Pack years: 0.00    Types: Cigarettes    Quit date: 01/22/1973    Years since quitting: 47.9   Smokeless tobacco: Former    Types: Chew, Snuff  Vaping Use   Vaping Use: Never used  Substance and Sexual Activity   Alcohol use: Yes    Comment: occasional glass of wine or beer   Drug use: No   Sexual activity: Not on file  Other Topics Concern   Not on file  Social History Narrative   Not on file   Social Determinants of Health   Financial Resource Strain: Not on file  Food Insecurity: Not on file  Transportation Needs: Not on file  Physical Activity: Not on file  Stress: Not on file  Social Connections: Not on file  Intimate Partner Violence: Not on file    Family History  Problem Relation Age of Onset   Cancer Father     Anti-infectives: Anti-infectives (From  admission, onward)    None       Current Outpatient Medications  Medication Sig Dispense Refill   amLODipine (NORVASC) 10 MG tablet Take 10 mg by mouth daily.     Cholecalciferol (VITAMIN D3) 125 MCG (5000 UT) CAPS Take 5,000 Units by mouth daily.     lisinopril (PRINIVIL,ZESTRIL) 10 MG tablet Take 10 mg by mouth daily.     simvastatin (ZOCOR) 40 MG tablet Take 40 mg by mouth daily.     tamsulosin (FLOMAX) 0.4 MG CAPS capsule Take 0.4 mg by mouth daily.     No current facility-administered medications for this visit.   Facility-Administered Medications Ordered in Other Visits  Medication Dose Route Frequency Provider Last Rate Last Admin   neomycin-polymyxin b-dexamethasone (MAXITROL) ophthalmic suspension    PRN Baruch Goldmann, MD   1 drop at 12/23/19 1332     Objective: Vital signs in  last 24 hours: BP 115/84   Pulse (!) 105   Wt 169 lb (76.7 kg)   BMI 27.28 kg/m   Intake/Output from previous day: No intake/output data recorded. Intake/Output this shift: @IOTHISSHIFT @   Physical Exam Vitals reviewed.  Constitutional:      Appearance: Normal appearance.  Genitourinary:    Comments: He has moderate anal stenosis which made the rectal exam difficult.   Prostate is benign but I was unable to fully palpate the gland. No masses noted.  Neurological:     Mental Status: He is alert.    Lab Results:   I have reviewed his outside labs including the PSA's noted above and his Cr was 1.08 in 07/21/20 Results for orders placed or performed in visit on 12/22/20 (from the past 24 hour(s))  Urinalysis, Routine w reflex microscopic     Status: Abnormal   Collection Time: 12/22/20 10:39 AM  Result Value Ref Range   Specific Gravity, UA 1.015 1.005 - 1.030   pH, UA 6.0 5.0 - 7.5   Color, UA Yellow Yellow   Appearance Ur Clear Clear   Leukocytes,UA Negative Negative   Protein,UA Negative Negative/Trace   Glucose, UA Negative Negative   Ketones, UA Negative Negative   RBC, UA Trace (A) Negative   Bilirubin, UA Negative Negative   Urobilinogen, Ur 0.2 0.2 - 1.0 mg/dL   Nitrite, UA Negative Negative   Microscopic Examination Comment    Narrative   Performed at:  [facility] [address] [place], [place]  [id number] Lab Director: Mina Marble MT, Phone:  [phone]      Studies/Results: No results found.   Assessment/Plan: Elevated PSA.  His PSA has been risen slowly and is up to 5.21.  It has been over 5 in the past and then declined so I will repeat a PSA today.  His exam is difficult because of anal stenosis so I will get a prostate MRI to help evaluate the prostate prior to considering a biopsy.  BPH with BOO.  He is doing well on tamsulosin.    No orders of the defined types were placed in this encounter.    Orders Placed This  Encounter  Procedures   MR PROSTATE W WO CONTRAST    Standing Status:   Future    Standing Expiration Date:   12/22/2021    Order Specific Question:   If indicated for the ordered procedure, I authorize the administration of contrast media per Radiology protocol    Answer:   Yes    Order Specific Question:   What  is the patient's sedation requirement?    Answer:   No Sedation    Order Specific Question:   Does the patient have a pacemaker or implanted devices?    Answer:   No    Order Specific Question:   Radiology Contrast Protocol - do NOT remove file path    Answer:   [email or web address]    Order Specific Question:   Preferred imaging location?    Answer:   [facility] (table limit-350 lbs)   Urinalysis, Routine w reflex microscopic   PSA, total and free     Return for I will call with PSA and MRI results prior to arranging f/u. Marland Kitchen    CC: Dr. Lucia Gaskins.      Irine Seal 12/22/2020 [phone] Patient ID: Dewain Penning, male   DOB: 11-Jun-1946, 75 y.o.   MRN: [id number]

## 2020-12-23 LAB — PSA, TOTAL AND FREE
PSA, Free Pct: 6.8 %
PSA, Free: 0.61 ng/mL
Prostate Specific Ag, Serum: 9 ng/mL — ABNORMAL HIGH (ref 0.0–4.0)

## 2020-12-29 ENCOUNTER — Telehealth: Payer: Self-pay

## 2020-12-29 DIAGNOSIS — Z85038 Personal history of other malignant neoplasm of large intestine: Secondary | ICD-10-CM | POA: Diagnosis not present

## 2020-12-29 DIAGNOSIS — Z933 Colostomy status: Secondary | ICD-10-CM | POA: Diagnosis not present

## 2020-12-29 DIAGNOSIS — E782 Mixed hyperlipidemia: Secondary | ICD-10-CM | POA: Diagnosis not present

## 2020-12-29 DIAGNOSIS — I10 Essential (primary) hypertension: Secondary | ICD-10-CM | POA: Diagnosis not present

## 2020-12-29 DIAGNOSIS — N3281 Overactive bladder: Secondary | ICD-10-CM | POA: Diagnosis not present

## 2020-12-29 NOTE — Telephone Encounter (Signed)
Patient needing a call back regarding surgery being scheduled.  Please advise.  Call back:  [phone]   Thanks, Helene Kelp

## 2020-12-29 NOTE — Telephone Encounter (Signed)
Returned patient call no answer

## 2021-01-02 ENCOUNTER — Other Ambulatory Visit: Payer: Self-pay | Admitting: Urology

## 2021-01-02 DIAGNOSIS — R972 Elevated prostate specific antigen [PSA]: Secondary | ICD-10-CM

## 2021-01-02 MED ORDER — DIAZEPAM 10 MG PO TABS: ORAL_TABLET | ORAL | 0 refills | Status: DC

## 2021-01-02 NOTE — Telephone Encounter (Signed)
Spoke with patient. Patient needs MRI scheduled. Patient given Caryl Pina number to have MRI scheduled.

## 2021-01-11 ENCOUNTER — Ambulatory Visit (HOSPITAL_COMMUNITY)
Admission: RE | Admit: 2021-01-11 | Discharge: 2021-01-11 | Disposition: A | Discharge status: Home / Self Care | Payer: Medicare HMO | Source: Ambulatory Visit | Attending: Urology | Admitting: Urology

## 2021-01-11 ENCOUNTER — Other Ambulatory Visit: Payer: Self-pay

## 2021-01-11 DIAGNOSIS — R972 Elevated prostate specific antigen [PSA]: Secondary | ICD-10-CM

## 2021-01-11 DIAGNOSIS — Z96641 Presence of right artificial hip joint: Secondary | ICD-10-CM | POA: Diagnosis not present

## 2021-01-11 DIAGNOSIS — N400 Enlarged prostate without lower urinary tract symptoms: Secondary | ICD-10-CM | POA: Diagnosis not present

## 2021-01-11 MED ORDER — GADOBUTROL 1 MMOL/ML IV SOLN
6.0000 mL | Freq: Once | INTRAVENOUS | Status: AC | PRN
  Administered 2021-01-11: 7 mL via INTRAVENOUS

## 2021-01-12 ENCOUNTER — Telehealth: Payer: Self-pay

## 2021-01-12 NOTE — Telephone Encounter (Signed)
-----   Message from Irine Seal, MD sent at 01/12/2021 10:09 AM EDT ----- The MRI didn't show any areas of concern, but his PSA was up to 9 which was a big jump.   I would like to get a repeat PSA in about 1 month and if it is still up we will need to consider a biopsy despite the MRI.  Please have him return to see me with the repeat PSA.   ----- Message ----- From: Dorisann Frames, RN Sent: 01/12/2021   9:42 AM EDT To: Irine Seal, MD  Please review

## 2021-01-12 NOTE — Telephone Encounter (Signed)
Called no answer

## 2021-01-16 NOTE — Progress Notes (Signed)
Unable to reach patient by phone on multiple attempts. Letter generated and mailed with appts.

## 2021-01-16 NOTE — Telephone Encounter (Signed)
Called patient again today to notify. No answer. No way to leave message.

## 2021-01-19 DIAGNOSIS — Z0000 Encounter for general adult medical examination without abnormal findings: Secondary | ICD-10-CM | POA: Diagnosis not present

## 2021-01-19 DIAGNOSIS — I10 Essential (primary) hypertension: Secondary | ICD-10-CM | POA: Diagnosis not present

## 2021-02-07 ENCOUNTER — Other Ambulatory Visit (HOSPITAL_COMMUNITY): Payer: Medicare HMO

## 2021-02-10 ENCOUNTER — Other Ambulatory Visit: Payer: PRIVATE HEALTH INSURANCE

## 2021-02-13 ENCOUNTER — Other Ambulatory Visit: Payer: Self-pay

## 2021-02-13 ENCOUNTER — Other Ambulatory Visit: Payer: PRIVATE HEALTH INSURANCE

## 2021-02-13 DIAGNOSIS — R972 Elevated prostate specific antigen [PSA]: Secondary | ICD-10-CM | POA: Diagnosis not present

## 2021-02-14 ENCOUNTER — Ambulatory Visit (HOSPITAL_COMMUNITY): Payer: Medicare HMO | Admitting: Hematology

## 2021-02-14 LAB — PSA, TOTAL AND FREE
PSA, Free Pct: 11 %
PSA, Free: 0.67 ng/mL
Prostate Specific Ag, Serum: 6.1 ng/mL — ABNORMAL HIGH (ref 0.0–4.0)

## 2021-02-16 ENCOUNTER — Other Ambulatory Visit: Payer: Self-pay

## 2021-02-16 ENCOUNTER — Ambulatory Visit (INDEPENDENT_AMBULATORY_CARE_PROVIDER_SITE_OTHER): Payer: Medicare HMO | Admitting: Urology

## 2021-02-16 VITALS — BP 124/70 | HR 72

## 2021-02-16 DIAGNOSIS — Z933 Colostomy status: Secondary | ICD-10-CM | POA: Diagnosis not present

## 2021-02-16 DIAGNOSIS — N138 Other obstructive and reflux uropathy: Secondary | ICD-10-CM | POA: Diagnosis not present

## 2021-02-16 DIAGNOSIS — N401 Enlarged prostate with lower urinary tract symptoms: Secondary | ICD-10-CM | POA: Diagnosis not present

## 2021-02-16 DIAGNOSIS — Z85038 Personal history of other malignant neoplasm of large intestine: Secondary | ICD-10-CM | POA: Diagnosis not present

## 2021-02-16 DIAGNOSIS — K624 Stenosis of anus and rectum: Secondary | ICD-10-CM | POA: Diagnosis not present

## 2021-02-16 DIAGNOSIS — R351 Nocturia: Secondary | ICD-10-CM | POA: Diagnosis not present

## 2021-02-16 DIAGNOSIS — R972 Elevated prostate specific antigen [PSA]: Secondary | ICD-10-CM

## 2021-02-16 LAB — URINALYSIS, ROUTINE W REFLEX MICROSCOPIC
Bilirubin, UA: NEGATIVE
Glucose, UA: NEGATIVE
Ketones, UA: NEGATIVE
Leukocytes,UA: NEGATIVE
Nitrite, UA: NEGATIVE
Protein,UA: NEGATIVE
RBC, UA: NEGATIVE
Specific Gravity, UA: 1.02 (ref 1.005–1.030)
Urobilinogen, Ur: 0.2 mg/dL (ref 0.2–1.0)
pH, UA: 6 (ref 5.0–7.5)

## 2021-02-16 NOTE — Progress Notes (Signed)

## 2021-02-16 NOTE — Progress Notes (Signed)
Subjective: 1. Elevated PSA   2. Enlarged prostate with urinary obstruction   3. Nocturia   4. Anal stenosis    02/16/21: Walter Orozco returns today in f/u.  His PSA on 12/22/20 was 9.0 with a 6.8% f/t ratio.  He had a prostate MRI on 01/11/21 that  showed a 4m prostate with  PIRADS 1 categorization.  A repeat PSA prior to this visit is down to 6.1 with an 11.0% f/t ratio. His IPSS is 3.  He remains on tamulosin.    12/22/20: Mr. PAsmarreturns for overdue f/u.  He was last seen in 11/19 for a history of an elevated PSA and his PSA then was 4.4.  His PSA had been above 5 in the past.   Since then his PSA has risen slowly and was 4.5 in 8/20, 5.0 in 7/21 and 5.21 on 07/21/20 which was stable.  His UA is unremarkable. He also has a history of colon cancer with bladder invasion that required resection followed by chemo and RTx.  He remains in remission with a low CEA.    He has a colostomy.  He has a history of stones.  He is voiding well and remains on tamsulosin.  He just got the scripts refilled.    ROS:  ROS  No Known Allergies  Past Medical History:  Diagnosis Date   Adenocarcinoma of sigmoid colon ([facility] 06/11/2011   Arthritis    High cholesterol    Hypertension     Past Surgical History:  Procedure Laterality Date   CATARACT EXTRACTION W/PHACO Left 12/23/2019   Procedure: CATARACT EXTRACTION PHACO AND INTRAOCULAR LENS PLACEMENT LEFT EYE;  Surgeon: WBaruch Goldmann MD;  [facility];  Service: Ophthalmology;  Laterality: Left;  CDE: 14.68   CATARACT EXTRACTION W/PHACO Right 01/08/2020   Procedure: CATARACT EXTRACTION PHACO AND INTRAOCULAR LENS PLACEMENT RIGHT EYE;  Surgeon: WBaruch Goldmann MD;  Location: [facility];  Service: Ophthalmology;  Laterality: Right;  CDE: 18.60   COLON SURGERY     COLONOSCOPY  07/10/2012   Procedure: COLONOSCOPY;  Surgeon: NRogene Houston MD;  [facility];  Service: Endoscopy;  Laterality: N/A;  1200   COLONOSCOPY N/A 02/29/2016   Procedure:  COLONOSCOPY;  Surgeon: NRogene Houston MD;  [facility];  Service: Endoscopy;  Laterality: N/A;  1200 - moved to 9/6 @ 10:30   TOTAL HIP ARTHROPLASTY Right 06/02/2019   Procedure: TOTAL HIP ARTHROPLASTY ANTERIOR APPROACH;  Surgeon: OParalee Cancel MD;  [facility]: [facility];  Service: Orthopedics;  Laterality: Right;  70 mins    Social History   Socioeconomic History   Marital status: Married    Spouse name: Not on file   Number of children: Not on file   Years of education: Not on file   Highest education level: Not on file  Occupational History   Not on file  Tobacco Use   Smoking status: Former    Types: Cigarettes    Quit date: 01/22/1973    Years since quitting: 48.1   Smokeless tobacco: Former    Types: Chew, Snuff  Vaping Use   Vaping Use: Never used  Substance and Sexual Activity   Alcohol use: Yes    Comment: occasional glass of wine or beer   Drug use: No   Sexual activity: Not on file  Other Topics Concern   Not on file  Social History Narrative   Not on file   Social Determinants of Health   Financial Resource Strain:  Not on file  Food Insecurity: Not on file  Transportation Needs: Not on file  Physical Activity: Not on file  Stress: Not on file  Social Connections: Not on file  Intimate Partner Violence: Not on file    Family History  Problem Relation Age of Onset   Cancer Father     Anti-infectives: Anti-infectives (From admission, onward)    None       Current Outpatient Medications  Medication Sig Dispense Refill   amLODipine (NORVASC) 10 MG tablet Take 10 mg by mouth daily.     Cholecalciferol (VITAMIN D3) 125 MCG (5000 UT) CAPS Take 5,000 Units by mouth daily.     lisinopril (PRINIVIL,ZESTRIL) 10 MG tablet Take 10 mg by mouth daily.     simvastatin (ZOCOR) 40 MG tablet Take 40 mg by mouth daily.     tamsulosin (FLOMAX) 0.4 MG CAPS capsule Take 0.4 mg by mouth daily.     No current facility-administered medications for this  visit.   Facility-Administered Medications Ordered in Other Visits  Medication Dose Route Frequency Provider Last Rate Last Admin   neomycin-polymyxin b-dexamethasone (MAXITROL) ophthalmic suspension    PRN Baruch Goldmann, MD   1 drop at 12/23/19 1332     Objective: Vital signs in last 24 hours: BP 124/70   Pulse 72   Intake/Output from previous day: No intake/output data recorded. Intake/Output this shift: '@IOTHISSHIFT'$ @   Physical Exam  Lab Results:   I have reviewed his outside labs including the PSA's noted above and his Cr was 1.08 in 07/21/20 Recent Results (from the past 2160 hour(s))  Urinalysis, Routine w reflex microscopic     Status: Abnormal   Collection Time: 12/22/20 10:39 AM  Result Value Ref Range   Specific Gravity, UA 1.015 1.005 - 1.030   pH, UA 6.0 5.0 - 7.5   Color, UA Yellow Yellow   Appearance Ur Clear Clear   Leukocytes,UA Negative Negative   Protein,UA Negative Negative/Trace   Glucose, UA Negative Negative   Ketones, UA Negative Negative   RBC, UA Trace (A) Negative   Bilirubin, UA Negative Negative   Urobilinogen, Ur 0.2 0.2 - 1.0 mg/dL   Nitrite, UA Negative Negative   Microscopic Examination Comment     Comment: Microscopic follows if indicated.  PSA, total and free     Status: Abnormal   Collection Time: 12/22/20  3:50 PM  Result Value Ref Range   Prostate Specific Ag, Serum 9.0 (H) 0.0 - 4.0 ng/mL    Comment: Roche ECLIA methodology. According to the American Urological Association, Serum PSA should decrease and remain at undetectable levels after radical prostatectomy. The AUA defines biochemical recurrence as an initial PSA value 0.2 ng/mL or greater followed by a subsequent confirmatory PSA value 0.2 ng/mL or greater. Values obtained with different assay methods or kits cannot be used interchangeably. Results cannot be interpreted as absolute evidence of the presence or absence of malignant disease.    PSA, Free 0.61 N/A ng/mL     Comment: Roche ECLIA methodology.   PSA, Free Pct 6.8 %    Comment: The table below lists the probability of prostate cancer for men with non-suspicious DRE results and total PSA between 4 and 10 ng/mL, by patient age Ricci Barker, [place], [id number]).                   % Free PSA       50-64 yr        50-75  yr                   0.00-10.00%        56%             55%                  10.01-15.00%        24%             35%                  15.01-20.00%        17%             23%                  20.01-25.00%        10%             20%                       >25.00%         5%              9% Please note:  Catalona et al did not make specific               recommendations regarding the use of               percent free PSA for any other population               of men.   PSA, total and free     Status: Abnormal   Collection Time: 02/13/21 10:19 AM  Result Value Ref Range   Prostate Specific Ag, Serum 6.1 (H) 0.0 - 4.0 ng/mL    Comment: Roche ECLIA methodology. According to the American Urological Association, Serum PSA should decrease and remain at undetectable levels after radical prostatectomy. The AUA defines biochemical recurrence as an initial PSA value 0.2 ng/mL or greater followed by a subsequent confirmatory PSA value 0.2 ng/mL or greater. Values obtained with different assay methods or kits cannot be used interchangeably. Results cannot be interpreted as absolute evidence of the presence or absence of malignant disease.    PSA, Free 0.67 N/A ng/mL    Comment: Roche ECLIA methodology.   PSA, Free Pct 11.0 %    Comment: The table below lists the probability of prostate cancer for men with non-suspicious DRE results and total PSA between 4 and 10 ng/mL, by patient age Ricci Barker, [place], [id number]).                   % Free PSA       50-64 yr        65-75 yr                   0.00-10.00%        56%             55%                  10.01-15.00%        24%              35%                  15.01-20.00%        17%             23%  20.01-25.00%        10%             20%                       >25.00%         5%              9% Please note:  Catalona et al did not make specific               recommendations regarding the use of               percent free PSA for any other population               of men.   Urinalysis, Routine w reflex microscopic     Status: None   Collection Time: 02/16/21  8:58 AM  Result Value Ref Range   Specific Gravity, UA 1.020 1.005 - 1.030   pH, UA 6.0 5.0 - 7.5   Color, UA Yellow Yellow   Appearance Ur Clear Clear   Leukocytes,UA Negative Negative   Protein,UA Negative Negative/Trace   Glucose, UA Negative Negative   Ketones, UA Negative Negative   RBC, UA Negative Negative   Bilirubin, UA Negative Negative   Urobilinogen, Ur 0.2 0.2 - 1.0 mg/dL   Nitrite, UA Negative Negative   Microscopic Examination Comment     Comment: Microscopic follows if indicated.        Studies/Results: MR PROSTATE W WO CONTRAST  Result Date: 01/12/2021 CLINICAL DATA:  Elevated PSA.  Enlarged prostate.  Anal stenosis. EXAM: MR PROSTATE WITHOUT AND WITH CONTRAST TECHNIQUE: Multiplanar multisequence MRI images were obtained of the pelvis centered about the prostate. Pre and post contrast images were obtained. Endo rectal coil could not be placed due to patient's history of anal stenosis. CONTRAST:  84m GADAVIST GADOBUTROL 1 MMOL/ML IV SOLN COMPARISON:  None. FINDINGS: Prostate: Evaluation is suboptimal due combination of lack of endorectal coil and 1.5 T magnetic field strength. -- Peripheral Zone: Heterogeneous T2 hypointensity is seen throughout the peripheral zone, however no focal lesion seen on ADC or high b-value DWI sequences. -- Transition/Central Zone: Mildly enlarged; however, no suspicious nodules with obscured margins or significantly restricted diffusion seen. -- Measurements/Volume:  3.5 by 3.5 x 4.9 cm (volume = 31  cm^3) Transcapsular spread:  Absent Seminal vesicle involvement:  Absent Neurovascular bundle involvement:  Absent Pelvic adenopathy: None visualized Bone metastasis: None visualized Other:  Right hip prosthesis noted. IMPRESSION: No radiographic evidence of high-grade prostate carcinoma. PI-RADS 1 (v2.1): Very Low (clinically significant cancer highly unlikely) Note suboptimal evaluation due to lack of endorectal coil and 1.5 T magnetic field strength. Because of anal stenosis and inability to place endorectal coil, any follow-up MRI should be performed on a 3.0 T MRI scanner. Electronically Signed   By: JMarlaine HindM.D.   On: 01/12/2021 09:17      Assessment/Plan: Elevated PSA.  His PSA was up significantly in June but the MRI was negative and his PSA has declined to 6.1 prior to this visit.  He had been at the beach with some friends and had symptoms of a viral illness the week prior to the June blood draw but tested negative for COVID  We discussed options including a biopsy, ExoDx and PSA f/u in 3 months and he would like to recheck the level in 3 months.   Anal stenosis.  He will need anesthesia  if a biopsy is required.   BPH with BOO.  He is doing well on tamsulosin.    No orders of the defined types were placed in this encounter.    Orders Placed This Encounter  Procedures   Urinalysis, Routine w reflex microscopic   PSA, total and free    Standing Status:   Future    Standing Expiration Date:   02/16/2022     Return in about 3 months (around 05/19/2021) for with PSA.    CC: Dr. Lucia Gaskins.      Irine Seal 02/16/2021 [id number] Patient ID: Walter Orozco, male   DOB: [birth year], 75 y.o.   MRN: [id number]

## 2021-02-20 ENCOUNTER — Other Ambulatory Visit (HOSPITAL_COMMUNITY): Payer: Self-pay | Admitting: *Deleted

## 2021-02-20 DIAGNOSIS — C187 Malignant neoplasm of sigmoid colon: Secondary | ICD-10-CM

## 2021-02-21 ENCOUNTER — Inpatient Hospital Stay (HOSPITAL_COMMUNITY): Payer: Medicare HMO | Attending: Hematology

## 2021-02-21 ENCOUNTER — Encounter (INDEPENDENT_AMBULATORY_CARE_PROVIDER_SITE_OTHER): Payer: Self-pay | Admitting: *Deleted

## 2021-02-21 ENCOUNTER — Other Ambulatory Visit: Payer: Self-pay

## 2021-02-21 DIAGNOSIS — C187 Malignant neoplasm of sigmoid colon: Secondary | ICD-10-CM | POA: Diagnosis present

## 2021-02-21 LAB — CBC WITH DIFFERENTIAL/PLATELET
Abs Immature Granulocytes: 0.03 10*3/uL (ref 0.00–0.07)
Basophils Absolute: 0 10*3/uL (ref 0.0–0.1)
Basophils Relative: 0 %
Eosinophils Absolute: 0.1 10*3/uL (ref 0.0–0.5)
Eosinophils Relative: 1 %
HCT: 47.5 % (ref 39.0–52.0)
Hemoglobin: 16.3 g/dL (ref 13.0–17.0)
Immature Granulocytes: 0 %
Lymphocytes Relative: 22 %
Lymphs Abs: 1.5 10*3/uL (ref 0.7–4.0)
MCH: 32.5 pg (ref 26.0–34.0)
MCHC: 34.3 g/dL (ref 30.0–36.0)
MCV: 94.6 fL (ref 80.0–100.0)
Monocytes Absolute: 0.6 10*3/uL (ref 0.1–1.0)
Monocytes Relative: 9 %
Neutro Abs: 4.5 10*3/uL (ref 1.7–7.7)
Neutrophils Relative %: 68 %
Platelets: 185 10*3/uL (ref 150–400)
RBC: 5.02 MIL/uL (ref 4.22–5.81)
RDW: 12.8 % (ref 11.5–15.5)
WBC: 6.7 10*3/uL (ref 4.0–10.5)
nRBC: 0 % (ref 0.0–0.2)

## 2021-02-21 LAB — COMPREHENSIVE METABOLIC PANEL
ALT: 25 U/L (ref 0–44)
AST: 22 U/L (ref 15–41)
Albumin: 4.2 g/dL (ref 3.5–5.0)
Alkaline Phosphatase: 66 U/L (ref 38–126)
Anion gap: 6 (ref 5–15)
BUN: 12 mg/dL (ref 8–23)
CO2: 28 mmol/L (ref 22–32)
Calcium: 9.5 mg/dL (ref 8.9–10.3)
Chloride: 105 mmol/L (ref 98–111)
Creatinine, Ser: 0.91 mg/dL (ref 0.61–1.24)
GFR, Estimated: >60 mL/min (ref >60)
Glucose, Bld: 101 mg/dL — ABNORMAL HIGH (ref 70–99)
Potassium: 5 mmol/L (ref 3.5–5.1)
Sodium: 139 mmol/L (ref 135–145)
Total Bilirubin: 0.5 mg/dL (ref 0.3–1.2)
Total Protein: 7.1 g/dL (ref 6.5–8.1)

## 2021-02-21 LAB — FERRITIN: Ferritin: 64 ng/mL (ref 24–336)

## 2021-02-22 LAB — CEA: CEA: 1.4 ng/mL (ref 0.0–4.7)

## 2021-02-26 NOTE — Progress Notes (Signed)
[facility] [address] [place] [address]   CLINIC:  Medical Oncology/Hematology  PCP:  Celene Squibb, MD [address] [place] [address] [phone]   REASON FOR VISIT:  Follow-up for sigmoid colon cancer  PRIOR THERAPY:  1. Sigmoid resection on 07/25/2005 by Dr. Romona Curls and Dr. Michela Pitcher. 2. Chemoradiation with oxaliplatin and bevacizumab.  NGS Results: not done  CURRENT THERAPY: surveillance  BRIEF ONCOLOGIC HISTORY:  Oncology History   No history exists.    CANCER STAGING: Cancer Staging Adenocarcinoma of sigmoid colon [facility]) Staging form: Colon and Rectum, AJCC 7th Edition - Clinical: Stage IIIC (T4b, N1, M0) - Signed by Baird Cancer, PA on 11/26/2011   INTERVAL HISTORY:  [name], a 75 y.o. male, returns for routine follow-up of his sigmoid colon cancer. Walter Orozco was last seen on 02/08/2020.   Today he reports feeling good. He denies any recent infections or hospitalizations. He reports regular BM.  REVIEW OF SYSTEMS:  Review of Systems  Constitutional:  Negative for appetite change and fatigue.  Gastrointestinal:  Negative for blood in stool, constipation and diarrhea.  All other systems reviewed and are negative.  PAST MEDICAL/SURGICAL HISTORY:  Past Medical History:  Diagnosis Date   Adenocarcinoma of sigmoid colon ([place]) 06/11/2011   Arthritis    High cholesterol    Hypertension    Past Surgical History:  Procedure Laterality Date   CATARACT EXTRACTION W/PHACO Left 12/23/2019   Procedure: CATARACT EXTRACTION PHACO AND INTRAOCULAR LENS PLACEMENT LEFT EYE;  Surgeon: Baruch Goldmann, MD;  Location: [facility];  Service: Ophthalmology;  Laterality: Left;  CDE: 14.68   CATARACT EXTRACTION W/PHACO Right 01/08/2020   Procedure: CATARACT EXTRACTION PHACO AND INTRAOCULAR LENS PLACEMENT RIGHT EYE;  Surgeon: Baruch Goldmann, MD;  Location: [facility];  Service: Ophthalmology;  Laterality: Right;  CDE: 18.60   COLON SURGERY      COLONOSCOPY  07/10/2012   Procedure: COLONOSCOPY;  Surgeon: Rogene Houston, MD;  [facility];  Service: Endoscopy;  Laterality: N/A;  1200   COLONOSCOPY N/A 02/29/2016   Procedure: COLONOSCOPY;  Surgeon: Rogene Houston, MD;  [facility];  Service: Endoscopy;  Laterality: N/A;  1200 - moved to 9/6 @ 10:30   TOTAL HIP ARTHROPLASTY Right 06/02/2019   Procedure: TOTAL HIP ARTHROPLASTY ANTERIOR APPROACH;  Surgeon: Paralee Cancel, MD;  Location[facility];  Service: Orthopedics;  Laterality: Right;  70 mins    SOCIAL HISTORY:  Social History   Socioeconomic History   Marital status: Married    Spouse name: Not on file   Number of children: Not on file   Years of education: Not on file   Highest education level: Not on file  Occupational History   Not on file  Tobacco Use   Smoking status: Former    Types: Cigarettes    Quit date: 01/22/1973    Years since quitting: 48.1   Smokeless tobacco: Former    Types: Chew, Snuff  Vaping Use   Vaping Use: Never used  Substance and Sexual Activity   Alcohol use: Yes    Comment: occasional glass of wine or beer   Drug use: No   Sexual activity: Not on file  Other Topics Concern   Not on file  Social History Narrative   Not on file   Social Determinants of Health   Financial Resource Strain: Not on file  Food Insecurity: Not on file  Transportation Needs: Not  on file  Physical Activity: Not on file  Stress: Not on file  Social Connections: Not on file  Intimate Partner Violence: Not on file    FAMILY HISTORY:  Family History  Problem Relation Age of Onset   Cancer Father     CURRENT MEDICATIONS:  Current Outpatient Medications  Medication Sig Dispense Refill   amLODipine (NORVASC) 10 MG tablet Take 10 mg by mouth daily.     Cholecalciferol (VITAMIN D3) 125 MCG (5000 UT) CAPS Take 5,000 Units by mouth daily.     lisinopril (PRINIVIL,ZESTRIL) 10 MG tablet Take 10 mg by mouth daily.     simvastatin (ZOCOR) 40 MG  tablet Take 40 mg by mouth daily.     tamsulosin (FLOMAX) 0.4 MG CAPS capsule Take 0.4 mg by mouth daily.     No current facility-administered medications for this visit.   Facility-Administered Medications Ordered in Other Visits  Medication Dose Route Frequency Provider Last Rate Last Admin   neomycin-polymyxin b-dexamethasone (MAXITROL) ophthalmic suspension    PRN Baruch Goldmann, MD   1 drop at 12/23/19 1332    ALLERGIES:  No Known Allergies  PHYSICAL EXAM:  Performance status (ECOG): 0 - Asymptomatic  Vitals:   02/28/21 1427  BP: (!) 146/74  Pulse: 97  Resp: 18  Temp: 97.8 F (36.6 C)  SpO2: 98%   Wt Readings from Last 3 Encounters:  02/28/21 167 lb 13.8 oz (76.1 kg)  12/22/20 169 lb (76.7 kg)  02/08/20 173 lb 6.4 oz (78.7 kg)   Physical Exam Vitals reviewed.  Constitutional:      Appearance: Normal appearance.  Cardiovascular:     Rate and Rhythm: Normal rate and regular rhythm.     Pulses: Normal pulses.     Heart sounds: Normal heart sounds.  Pulmonary:     Effort: Pulmonary effort is normal.     Breath sounds: Normal breath sounds.  Neurological:     General: No focal deficit present.     Mental Status: He is alert and oriented to person, place, and time.  Psychiatric:        Mood and Affect: Mood normal.        Behavior: Behavior normal.     LABORATORY DATA:  I have reviewed the labs as listed.  CBC Latest Ref Rng & Units 02/21/2021 02/01/2020 05/29/2019  WBC 4.0 - 10.5 K/uL 6.7 7.1 7.9  Hemoglobin 13.0 - 17.0 g/dL 16.3 15.4 16.6  Hematocrit 39.0 - 52.0 % 47.5 45.8 49.0  Platelets 150 - 400 K/uL 185 183 222   CMP Latest Ref Rng & Units 02/21/2021 02/01/2020 05/29/2019  Glucose 70 - 99 mg/dL 101(H) 105(H) 103(H)  BUN 8 - 23 mg/dL '12 12 12  '$ Creatinine 0.61 - 1.24 mg/dL 0.91 0.81 0.75  Sodium 135 - 145 mmol/L 139 139 139  Potassium 3.5 - 5.1 mmol/L 5.0 4.4 4.2  Chloride 98 - 111 mmol/L 105 104 103  CO2 22 - 32 mmol/L '28 26 24  '$ Calcium 8.9 - 10.3 mg/dL  9.5 9.5 9.8  Total Protein 6.5 - 8.1 g/dL 7.1 7.1 -  Total Bilirubin 0.3 - 1.2 mg/dL 0.5 0.7 -  Alkaline Phos 38 - 126 U/L 66 67 -  AST 15 - 41 U/L 22 18 -  ALT 0 - 44 U/L 25 22 -    DIAGNOSTIC IMAGING:  I have independently reviewed the scans and discussed with the patient. No results found.   ASSESSMENT:    1.  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  Chemoradiation therapy followed by oxaliplatin-based chemo with bevacizumab. -Last colonoscopy on 02/29/2016 demonstrating diverticulosis, diversion colitis.  Repeat colonoscopy in 5 years recommended.   PLAN:  1.  Adenocarcinoma of sigmoid colon: - Denies any bleeding into the colostomy bag.  Denies any change in the consistency of the bowels. - Reviewed labs from 02/21/2021.  Normal LFTs and CBC.  CEA was normal at 1.4. - No indication for imaging.  RTC 1 year for follow-up with repeat labs.  2.  Elevated PSA: - Found to have elevated PSA of 9 on 12/22/2020. - MRI of the prostate was negative on 01/11/2021.  PSA improved to 6.1 on 02/14/2021.   Orders placed this encounter:  No orders of the defined types were placed in this encounter.    Derek Jack, MD [place] [phone]   I, Thana Ates, am acting as a scribe for Dr. Derek Jack.  I, Derek Jack MD, have reviewed the above documentation for accuracy and completeness, and I agree with the above.

## 2021-02-28 ENCOUNTER — Other Ambulatory Visit: Payer: Self-pay

## 2021-02-28 ENCOUNTER — Inpatient Hospital Stay (HOSPITAL_COMMUNITY): Payer: Medicare HMO | Attending: Hematology | Admitting: Hematology

## 2021-02-28 VITALS — BP 146/74 | HR 97 | Temp 97.8°F | Resp 18 | Wt 167.9 lb

## 2021-02-28 DIAGNOSIS — I10 Essential (primary) hypertension: Secondary | ICD-10-CM | POA: Insufficient documentation

## 2021-02-28 DIAGNOSIS — E7800 Pure hypercholesterolemia, unspecified: Secondary | ICD-10-CM | POA: Insufficient documentation

## 2021-02-28 DIAGNOSIS — Z79899 Other long term (current) drug therapy: Secondary | ICD-10-CM | POA: Diagnosis not present

## 2021-02-28 DIAGNOSIS — N400 Enlarged prostate without lower urinary tract symptoms: Secondary | ICD-10-CM | POA: Insufficient documentation

## 2021-02-28 DIAGNOSIS — Z87891 Personal history of nicotine dependence: Secondary | ICD-10-CM | POA: Diagnosis not present

## 2021-02-28 DIAGNOSIS — C187 Malignant neoplasm of sigmoid colon: Secondary | ICD-10-CM | POA: Insufficient documentation

## 2021-02-28 DIAGNOSIS — Z933 Colostomy status: Secondary | ICD-10-CM | POA: Diagnosis not present

## 2021-02-28 NOTE — Patient Instructions (Signed)
[facility] at [facility] Discharge Instructions  You were seen today by Dr. Katragadda. He went over your recent results. Dr. Katragadda will see you back in 1 year for labs and follow up.   Thank you for choosing [facility] at [facility] to provide your oncology and hematology care.  To afford each patient quality time with our provider, please arrive at least 15 minutes before your scheduled appointment time.   If you have a lab appointment with the Cancer Center please come in thru the Main Entrance and check in at the main information desk  You need to re-schedule your appointment should you arrive 10 or more minutes late.  We strive to give you quality time with our providers, and arriving late affects you and other patients whose appointments are after yours.  Also, if you no show three or more times for appointments you may be dismissed from the clinic at the providers discretion.     Again, thank you for choosing [facility].  Our hope is that these requests will decrease the amount of time that you wait before being seen by our physicians.       _____________________________________________________________  Should you have questions after your visit to [facility], please contact our office at [phone] between the hours of 8:00 a.m. and 4:30 p.m.  Voicemails left after 4:00 p.m. will not be returned until the following business day.  For prescription refill requests, have your pharmacy contact our office and allow 72 hours.    Cancer Center Support Programs:   > Cancer Support Group  2nd Tuesday of the month 1pm-2pm, Journey Room    

## 2021-03-06 DIAGNOSIS — H04123 Dry eye syndrome of bilateral lacrimal glands: Secondary | ICD-10-CM | POA: Diagnosis not present

## 2021-03-14 ENCOUNTER — Telehealth (INDEPENDENT_AMBULATORY_CARE_PROVIDER_SITE_OTHER): Payer: Self-pay

## 2021-03-14 NOTE — Telephone Encounter (Signed)
Referring MD/PCP: HALL  Procedure: Tcs  Reason/Indication:  History of Polyps, Fam Hx of Colon Ca   Has patient had this procedure before?  Yes   If so, when, by whom and where?  02/2016  Is there a family history of colon cancer?  Yes   Who?  What age when diagnosed?  Father  Is patient diabetic? If yes, Type 1 or Type 2   no      Does patient have prosthetic heart valve or mechanical valve?  no  Do you have a pacemaker/defibrillator?  no  Has patient ever had endocarditis/atrial fibrillation? no  Does patient use oxygen? no  Has patient had joint replacement within last 12 months?  no  Is patient constipated or do they take laxatives? no  Does patient have a history of alcohol/drug use?  no  Have you had a stroke/heart attack last 6 mths? no  Do you take medicine for weight loss?  no  For male patients,: do you still have your menstrual cycle? N/A  Is patient on blood thinner such as Coumadin, Plavix and/or Aspirin? no  Medications: lisinopril 10 mg daily, amlodipine 10 mg daily, simvastatin 40 mg daily, tamsulosin 0.4 mg daily, Vit D daily  Allergies: nkda  Medication Adjustment per Dr Laural Golden none  Procedure date & time: 03/29/21  12:30

## 2021-03-15 ENCOUNTER — Encounter (INDEPENDENT_AMBULATORY_CARE_PROVIDER_SITE_OTHER): Payer: Self-pay

## 2021-03-15 ENCOUNTER — Telehealth (INDEPENDENT_AMBULATORY_CARE_PROVIDER_SITE_OTHER): Payer: Self-pay

## 2021-03-15 MED ORDER — PEG 3350-KCL-NA BICARB-NACL 420 G PO SOLR: 4000.0000 mL | ORAL | 0 refills | Status: DC

## 2021-03-15 NOTE — Telephone Encounter (Signed)
Walter Orozco, CMA  

## 2021-03-16 DIAGNOSIS — M1711 Unilateral primary osteoarthritis, right knee: Secondary | ICD-10-CM | POA: Diagnosis not present

## 2021-03-16 DIAGNOSIS — M170 Bilateral primary osteoarthritis of knee: Secondary | ICD-10-CM | POA: Diagnosis not present

## 2021-03-20 DIAGNOSIS — D2262 Melanocytic nevi of left upper limb, including shoulder: Secondary | ICD-10-CM | POA: Diagnosis not present

## 2021-03-20 DIAGNOSIS — L570 Actinic keratosis: Secondary | ICD-10-CM | POA: Diagnosis not present

## 2021-03-20 DIAGNOSIS — D485 Neoplasm of uncertain behavior of skin: Secondary | ICD-10-CM | POA: Diagnosis not present

## 2021-03-23 NOTE — Patient Instructions (Signed)
Walter Orozco  03/23/2021     @PREFPERIOPPHARMACY @   Your procedure is scheduled on  03/29/2021.   Report to [facility] at  [email or web address]   Call this number if you have problems the morning of surgery:  [phone]   Remember:  Follow the diet and prep instructions given to you by the office.    Take these medicines the morning of surgery with A SIP OF WATER                        amlodipine, flomax.    Do not wear jewelry, make-up or nail polish.  Do not wear lotions, powders, or perfumes, or deodorant.  Do not shave 48 hours prior to surgery.  Men may shave face and neck.  Do not bring valuables to the hospital.  [facility] is not responsible for any belongings or valuables.  Contacts, dentures or bridgework may not be worn into surgery.  Leave your suitcase in the car.  After surgery it may be brought to your room.  For patients admitted to the hospital, discharge time will be determined by your treatment team.  Patients discharged the day of surgery will not be allowed to drive home and must have someone with them for 24 hours.    Special instructions:   DO NOT smoke tobacco or vape for 24 hours before your procedure.  Please read over the following fact sheets that you were given. Anesthesia Post-op Instructions and Care and Recovery After Surgery      Colonoscopy, Adult, Care After This sheet gives you information about how to care for yourself after your procedure. Your health care provider may also give you more specific instructions. If you have problems or questions, contact your health care provider. What can I expect after the procedure? After the procedure, it is common to have: A small amount of blood in your stool for 24 hours after the procedure. Some gas. Mild cramping or bloating of your abdomen. Follow these instructions at home: Eating and drinking  Drink enough fluid to keep your urine pale yellow. Follow instructions from your health  care provider about eating or drinking restrictions. Resume your normal diet as instructed by your health care provider. Avoid heavy or fried foods that are hard to digest. Activity Rest as told by your health care provider. Avoid sitting for a long time without moving. Get up to take short walks every 1-2 hours. This is important to improve blood flow and breathing. Ask for help if you feel weak or unsteady. Return to your normal activities as told by your health care provider. Ask your health care provider what activities are safe for you. Managing cramping and bloating  Try walking around when you have cramps or feel bloated. Apply heat to your abdomen as told by your health care provider. Use the heat source that your health care provider recommends, such as a moist heat pack or a heating pad. Place a towel between your skin and the heat source. Leave the heat on for 20-30 minutes. Remove the heat if your skin turns bright red. This is especially important if you are unable to feel pain, heat, or cold. You may have a greater risk of getting burned. General instructions If you were given a sedative during the procedure, it can affect you for several hours. Do not drive or operate machinery until your health care provider says that it  is safe. For the first 24 hours after the procedure: Do not sign important documents. Do not drink alcohol. Do your regular daily activities at a slower pace than normal. Eat soft foods that are easy to digest. Take over-the-counter and prescription medicines only as told by your health care provider. Keep all follow-up visits as told by your health care provider. This is important. Contact a health care provider if: You have blood in your stool 2-3 days after the procedure. Get help right away if you have: More than a small spotting of blood in your stool. Large blood clots in your stool. Swelling of your abdomen. Nausea or vomiting. A fever. Increasing  pain in your abdomen that is not relieved with medicine. Summary After the procedure, it is common to have a small amount of blood in your stool. You may also have mild cramping and bloating of your abdomen. If you were given a sedative during the procedure, it can affect you for several hours. Do not drive or operate machinery until your health care provider says that it is safe. Get help right away if you have a lot of blood in your stool, nausea or vomiting, a fever, or increased pain in your abdomen. This information is not intended to replace advice given to you by your health care provider. Make sure you discuss any questions you have with your health care provider. Document Revised: 06/05/2019 Document Reviewed: 01/05/2019 Elsevier Patient Education  [place] After This sheet gives you information about how to care for yourself after your procedure. Your health care provider may also give you more specific instructions. If you have problems or questions, contact your health care provider. What can I expect after the procedure? After the procedure, it is common to have: Tiredness. Forgetfulness about what happened after the procedure. Impaired judgment for important decisions. Nausea or vomiting. Some difficulty with balance. Follow these instructions at home: For the time period you were told by your health care provider:   Rest as needed. Do not participate in activities where you could fall or become injured. Do not drive or use machinery. Do not drink alcohol. Do not take sleeping pills or medicines that cause drowsiness. Do not make important decisions or sign legal documents. Do not take care of children on your own. Eating and drinking Follow the diet that is recommended by your health care provider. Drink enough fluid to keep your urine pale yellow. If you vomit: Drink water, juice, or soup when you can drink without  vomiting. Make sure you have little or no nausea before eating solid foods. General instructions Have a responsible adult stay with you for the time you are told. It is important to have someone help care for you until you are awake and alert. Take over-the-counter and prescription medicines only as told by your health care provider. If you have sleep apnea, surgery and certain medicines can increase your risk for breathing problems. Follow instructions from your health care provider about wearing your sleep device: Anytime you are sleeping, including during daytime naps. While taking prescription pain medicines, sleeping medicines, or medicines that make you drowsy. Avoid smoking. Keep all follow-up visits as told by your health care provider. This is important. Contact a health care provider if: You keep feeling nauseous or you keep vomiting. You feel light-headed. You are still sleepy or having trouble with balance after 24 hours. You develop a rash. You have a fever. You have redness  or swelling around the IV site. Get help right away if: You have trouble breathing. You have new-onset confusion at home. Summary For several hours after your procedure, you may feel tired. You may also be forgetful and have poor judgment. Have a responsible adult stay with you for the time you are told. It is important to have someone help care for you until you are awake and alert. Rest as told. Do not drive or operate machinery. Do not drink alcohol or take sleeping pills. Get help right away if you have trouble breathing, or if you suddenly become confused. This information is not intended to replace advice given to you by your health care provider. Make sure you discuss any questions you have with your health care provider. Document Revised: 02/25/2020 Document Reviewed: 05/14/2019 Elsevier Patient Education  2022 [employer].

## 2021-03-24 ENCOUNTER — Encounter (HOSPITAL_COMMUNITY)
Admission: RE | Admit: 2021-03-24 | Discharge: 2021-03-24 | Disposition: A | Discharge status: Home / Self Care | Payer: Medicare HMO | Source: Ambulatory Visit | Attending: Internal Medicine | Admitting: Internal Medicine

## 2021-03-24 ENCOUNTER — Other Ambulatory Visit: Payer: Self-pay

## 2021-03-24 DIAGNOSIS — Z01810 Encounter for preprocedural cardiovascular examination: Secondary | ICD-10-CM | POA: Insufficient documentation

## 2021-03-29 ENCOUNTER — Ambulatory Visit (HOSPITAL_COMMUNITY)
Admission: RE | Admit: 2021-03-29 | Discharge: 2021-03-29 | Disposition: A | Discharge status: Home / Self Care | Payer: Medicare HMO | Attending: Internal Medicine | Admitting: Internal Medicine

## 2021-03-29 ENCOUNTER — Encounter (HOSPITAL_COMMUNITY): Payer: Self-pay | Admitting: Internal Medicine

## 2021-03-29 ENCOUNTER — Encounter (INDEPENDENT_AMBULATORY_CARE_PROVIDER_SITE_OTHER): Payer: Self-pay | Admitting: *Deleted

## 2021-03-29 ENCOUNTER — Encounter (HOSPITAL_COMMUNITY)
Admission: RE | Disposition: A | Discharge status: Home / Self Care | Payer: Self-pay | Source: Home / Self Care | Attending: Internal Medicine

## 2021-03-29 ENCOUNTER — Ambulatory Visit (HOSPITAL_COMMUNITY): Payer: Medicare HMO | Admitting: Anesthesiology

## 2021-03-29 DIAGNOSIS — Z79899 Other long term (current) drug therapy: Secondary | ICD-10-CM | POA: Insufficient documentation

## 2021-03-29 DIAGNOSIS — Z87891 Personal history of nicotine dependence: Secondary | ICD-10-CM | POA: Diagnosis not present

## 2021-03-29 DIAGNOSIS — K625 Hemorrhage of anus and rectum: Secondary | ICD-10-CM | POA: Insufficient documentation

## 2021-03-29 DIAGNOSIS — Z1211 Encounter for screening for malignant neoplasm of colon: Secondary | ICD-10-CM | POA: Diagnosis not present

## 2021-03-29 DIAGNOSIS — Z800 Family history of malignant neoplasm of digestive organs: Secondary | ICD-10-CM | POA: Insufficient documentation

## 2021-03-29 DIAGNOSIS — Z85038 Personal history of other malignant neoplasm of large intestine: Secondary | ICD-10-CM | POA: Diagnosis not present

## 2021-03-29 DIAGNOSIS — Z933 Colostomy status: Secondary | ICD-10-CM | POA: Insufficient documentation

## 2021-03-29 DIAGNOSIS — K6389 Other specified diseases of intestine: Secondary | ICD-10-CM | POA: Insufficient documentation

## 2021-03-29 DIAGNOSIS — Z08 Encounter for follow-up examination after completed treatment for malignant neoplasm: Secondary | ICD-10-CM | POA: Diagnosis not present

## 2021-03-29 DIAGNOSIS — K5730 Diverticulosis of large intestine without perforation or abscess without bleeding: Secondary | ICD-10-CM | POA: Diagnosis not present

## 2021-03-29 HISTORY — PX: COLONOSCOPY WITH PROPOFOL: SHX5780

## 2021-03-29 LAB — HM COLONOSCOPY

## 2021-03-29 SURGERY — COLONOSCOPY WITH PROPOFOL
Anesthesia: General

## 2021-03-29 MED ORDER — LIDOCAINE HCL (CARDIAC) PF 100 MG/5ML IV SOSY
PREFILLED_SYRINGE | INTRAVENOUS | Status: DC | PRN
  Administered 2021-03-29: 50 mg via INTRAVENOUS

## 2021-03-29 MED ORDER — PROPOFOL 10 MG/ML IV BOLUS
INTRAVENOUS | Status: DC | PRN
  Administered 2021-03-29: 30 mg via INTRAVENOUS
  Administered 2021-03-29: 100 mg via INTRAVENOUS

## 2021-03-29 MED ORDER — STERILE WATER FOR IRRIGATION IR SOLN
Status: DC | PRN
  Administered 2021-03-29: 200 mL

## 2021-03-29 MED ORDER — PROPOFOL 500 MG/50ML IV EMUL
INTRAVENOUS | Status: DC | PRN
  Administered 2021-03-29: 100 ug/kg/min via INTRAVENOUS

## 2021-03-29 MED ORDER — LACTATED RINGERS IV SOLN: INTRAVENOUS | Status: DC

## 2021-03-29 NOTE — Transfer of Care (Signed)
Immediate Anesthesia Transfer of Care Note  Patient: Walter Orozco  Procedure(s) Performed: COLONOSCOPY WITH PROPOFOL  Patient Location: Short Stay  Anesthesia Type:General  Level of Consciousness: awake, alert  and oriented  Airway & Oxygen Therapy: Patient Spontanous Breathing  Post-op Assessment: Report given to RN, Post -op Vital signs reviewed and stable and Patient moving all extremities X 4  Post vital signs: Reviewed and stable  Last Vitals:  Vitals Value Taken Time  BP    Temp    Pulse    Resp    SpO2      Last Pain:  Vitals:   03/29/21 1122  TempSrc:   PainSc: 0-No pain      Patients Stated Pain Goal: 5 (66/29/47 6546)  Complications: No notable events documented.

## 2021-03-29 NOTE — Anesthesia Preprocedure Evaluation (Signed)
Anesthesia Evaluation  Patient identified by MRN, date of birth, ID band Patient awake    Reviewed: Allergy & Precautions, H&P , NPO status , Patient's Chart, lab work & pertinent test results, reviewed documented beta blocker date and time   Airway Mallampati: II  TM Distance: >3 FB Neck ROM: full    Dental no notable dental hx.    Pulmonary neg pulmonary ROS, former smoker,    Pulmonary exam normal breath sounds clear to auscultation       Cardiovascular Exercise Tolerance: Good hypertension, negative cardio ROS   Rhythm:regular Rate:Normal     Neuro/Psych negative neurological ROS  negative psych ROS   GI/Hepatic negative GI ROS, Neg liver ROS,   Endo/Other  negative endocrine ROS  Renal/GU negative Renal ROS  negative genitourinary   Musculoskeletal   Abdominal   Peds  Hematology negative hematology ROS (+)   Anesthesia Other Findings   Reproductive/Obstetrics negative OB ROS                             Anesthesia Physical Anesthesia Plan  ASA: 2  Anesthesia Plan: General   Post-op Pain Management:    Induction:   PONV Risk Score and Plan: Propofol infusion  Airway Management Planned:   Additional Equipment:   Intra-op Plan:   Post-operative Plan:   Informed Consent: I have reviewed the patients History and Physical, chart, labs and discussed the procedure including the risks, benefits and alternatives for the proposed anesthesia with the patient or authorized representative who has indicated his/her understanding and acceptance.     Dental Advisory Given  Plan Discussed with: CRNA  Anesthesia Plan Comments:         Anesthesia Quick Evaluation  

## 2021-03-29 NOTE — H&P (Signed)
Walter Orozco is an 75 y.o. male.   Chief Complaint: Patient is here for colonoscopy. HPI: Patient is 75 year old Caucasian male who has a history of sigmoid colon carcinoma.  He had surgery in January 2007 with sigmoid colostomy.  He still has rectal stump in place.  He has remained in remission.  His last colonoscopy was in September 2017.  He is not having melena or bleeding into colostomy.  He denies abdominal pain anorexia weight loss.  He occasionally notices clear mucus discharge per rectum. Family history significant for colon carcinoma in his father who was in his mid 20s at the time of diagnosis and died when he was 65 or 75 years old. Patient does not take aspirin but takes Aleve on as-needed basis.  Past Medical History:  Diagnosis Date   Adenocarcinoma of sigmoid colon ([place]) January, 2007   Arthritis    High cholesterol    Hypertension     Past Surgical History:  Procedure Laterality Date   CATARACT EXTRACTION W/PHACO Left 12/23/2019   Procedure: CATARACT EXTRACTION PHACO AND INTRAOCULAR LENS PLACEMENT LEFT EYE;  Surgeon: Baruch Goldmann, MD;  Location: [facility];  Service: Ophthalmology;  Laterality: Left;  CDE: 14.68   CATARACT EXTRACTION W/PHACO Right 01/08/2020   Procedure: CATARACT EXTRACTION PHACO AND INTRAOCULAR LENS PLACEMENT RIGHT EYE;  Surgeon: Baruch Goldmann, MD;  Location: [facility];  Service: Ophthalmology;  Laterality: Right;  CDE: 18.60   COLON SURGERY     COLONOSCOPY  07/10/2012   Procedure: COLONOSCOPY;  Surgeon: Rogene Houston, MD;  Location: [facility];  Service: Endoscopy;  Laterality: N/A;  1200   COLONOSCOPY N/A 02/29/2016   Procedure: COLONOSCOPY;  Surgeon: Rogene Houston, MD;  Location: [facility];  Service: Endoscopy;  Laterality: N/A;  1200 - moved to 9/6 @ 10:30   TOTAL HIP ARTHROPLASTY Right 06/02/2019   Procedure: TOTAL HIP ARTHROPLASTY ANTERIOR APPROACH;  Surgeon: Paralee Cancel, MD;  Location: [facility];  Service: Orthopedics;  Laterality: Right;  70 mins     Family History  Problem Relation Age of Onset   Cancer Father    Social History:  reports that he quit smoking about 48 years ago. His smoking use included cigarettes. He has quit using smokeless tobacco.  His smokeless tobacco use included chew and snuff. He reports current alcohol use. He reports that he does not use drugs.  Allergies: No Known Allergies  Medications Prior to Admission  Medication Sig Dispense Refill   amLODipine (NORVASC) 10 MG tablet Take 10 mg by mouth daily.     calcium carbonate (TUMS EX) 750 MG chewable tablet Chew 1 tablet by mouth daily as needed for heartburn.     Carboxymethylcellulose Sodium (LUBRICANT EYE DROPS OP) Place 1 drop into both eyes daily as needed (Dry eye).     Cholecalciferol (VITAMIN D3) 125 MCG (5000 UT) CAPS Take 5,000 Units by mouth daily.     lisinopril (PRINIVIL,ZESTRIL) 10 MG tablet Take 10 mg by mouth daily.     naproxen sodium (ALEVE) 220 MG tablet Take 440 mg by mouth daily as needed (pain).     polyethylene glycol-electrolytes (TRILYTE) 420 g solution Take 4,000 mLs by mouth as directed. 4000 mL 0   simvastatin (ZOCOR) 40 MG tablet Take 40 mg by mouth daily.     tamsulosin (FLOMAX) 0.4 MG CAPS capsule Take 0.4 mg by mouth daily.      No results found for this or any previous visit (from the past 48 hour(s)).  No results found.  Review of Systems  Blood pressure 117/79, pulse 73, temperature 98 F (36.7 C), temperature source Oral, resp. rate 18, SpO2 99 %. Physical Exam Eyes:     General: No scleral icterus.    Conjunctiva/sclera: Conjunctivae normal.     Pupils: Pupils are equal, round, and reactive to light.  Cardiovascular:     Rate and Rhythm: Normal rate and regular rhythm.     Heart sounds: Normal heart sounds. No murmur heard. Pulmonary:     Effort: Pulmonary effort is normal.     Breath sounds: Normal breath sounds.  Abdominal:     Comments: Abdomen is symmetrical with low midline scar and colostomy is in left  lower quadrant.  Mucosa and colostomy appears to be normal.  On palpation abdomen is soft and nontender with organomegaly or masses.  Musculoskeletal:        General: No swelling.     Cervical back: Neck supple.  Lymphadenopathy:     Cervical: No cervical adenopathy.  Skin:    General: Skin is warm and dry.  Neurological:     Mental Status: He is alert.     Assessment/Plan  History of colon cancer. Surveillance colonoscopy via colostomy along with examination of rectal stump.  Hildred Laser, MD 03/29/2021, 11:17 AM

## 2021-03-29 NOTE — Anesthesia Postprocedure Evaluation (Signed)
Anesthesia Post Note  Patient: Walter Orozco  Procedure(s) Performed: COLONOSCOPY WITH PROPOFOL  Patient location during evaluation: Phase II Anesthesia Type: General Level of consciousness: awake Pain management: pain level controlled Vital Signs Assessment: post-procedure vital signs reviewed and stable Respiratory status: spontaneous breathing and respiratory function stable Cardiovascular status: blood pressure returned to baseline and stable Postop Assessment: no headache and no apparent nausea or vomiting Anesthetic complications: no Comments: Late entry   No notable events documented.   Last Vitals:  Vitals:   03/29/21 1108 03/29/21 1147  BP: 117/79 103/73  Pulse: 73 80  Resp: 18 18  Temp: 36.7 C 36.4 C  SpO2: 99% 96%    Last Pain:  Vitals:   03/29/21 1147  TempSrc: Oral  PainSc: 0-No pain                 Louann Sjogren

## 2021-03-29 NOTE — Anesthesia Procedure Notes (Signed)
Date/Time: 03/29/2021 11:27 AM Performed by: Orlie Dakin, CRNA Pre-anesthesia Checklist: Patient identified, Emergency Drugs available, Suction available and Patient being monitored Patient Re-evaluated:Patient Re-evaluated prior to induction Oxygen Delivery Method: Nasal cannula Induction Type: IV induction Placement Confirmation: positive ETCO2

## 2021-03-29 NOTE — Op Note (Signed)
[facility] Patient Name: Walter Orozco Procedure Date: 03/29/2021 11:04 AM MRN: [id number] Date of Birth: July 02, 1945 Attending MD: Hildred Laser , MD CSN: [id number] Age: 75 Admit Type: Outpatient Procedure:                Colonoscopy via sigmoid colostomy and examination                            of rectosigmoid stump Indications:              High risk colon cancer surveillance: Personal                            history of colon cancer Providers:                Hildred Laser, MD, Lambert Mody, Nelma Rothman,                            Technician Referring MD:             Delphina Cahill, MD Medicines:                Propofol per Anesthesia Complications:            No immediate complications. Estimated Blood Loss:     Estimated blood loss: none. Procedure:                Pre-Anesthesia Assessment:                           - Prior to the procedure, a History and Physical                            was performed, and patient medications and                            allergies were reviewed. The patient's tolerance of                            previous anesthesia was also reviewed. The risks                            and benefits of the procedure and the sedation                            options and risks were discussed with the patient.                            All questions were answered, and informed consent                            was obtained. Prior Anticoagulants: The patient has                            taken no previous anticoagulant or antiplatelet                            agents.  ASA Grade Assessment: II - A patient with                            mild systemic disease. After reviewing the risks                            and benefits, the patient was deemed in                            satisfactory condition to undergo the procedure.                           After obtaining informed consent, the colonoscope                            was passed under  direct vision. Throughout the                            procedure, the patient's blood pressure, pulse, and                            oxygen saturations were monitored continuously. The                            [id number]-[id number] ([id number]) scope was introduced through                            the sigmoid colostomy and advanced to the the                            cecum, identified by appendiceal orifice and                            ileocecal valve. The colonoscopy was performed                            without difficulty. The patient tolerated the                            procedure well. The quality of the bowel                            preparation was good. The ileocecal valve, the                            appendiceal orifice and the rectum were                            photographed. Scope In: 11:28:48 AM Scope Out: 11:42:30 AM Scope Withdrawal Time: 0 hours 8 minutes 27 seconds  Total Procedure Duration: 0 hours 13 minutes 42 seconds  Findings:      The perianal and digital rectal examinations were normal.      A diffuse area of friable mucosa with contact bleeding was found in the  rectum and distal sigmoid colon.      A few small-mouthed diverticula were found in the descending colon and       transverse colon.      The exam was otherwise normal throughout the examined colon. Impression:               - Friability with contact bleeding in the rectum                            and distal sigmoid colon.                           - Diverticulosis in the descending colon and in the                            transverse colon.                           - No specimens collected. Moderate Sedation:      Per Anesthesia Care Recommendation:           - Patient has a contact number available for                            emergencies. The signs and symptoms of potential                            delayed complications were discussed with the                             patient. Return to normal activities tomorrow.                            Written discharge instructions were provided to the                            patient.                           - High fiber diet today.                           - Continue present medications.                           - Repeat colonoscopy in 5 years for surveillance. Procedure Code(s):        --- Professional ---                           [phone], Colonoscopy through stoma; diagnostic,                            including collection of specimen(s) by brushing or                            washing, when performed (separate procedure) Diagnosis Code(s):        --- Professional ---  Z85.038, Personal history of other malignant                            neoplasm of large intestine                           K62.5, Hemorrhage of anus and rectum                           K57.30, Diverticulosis of large intestine without                            perforation or abscess without bleeding CPT copyright 2019 [employer]. All rights reserved. The codes documented in this report are preliminary and upon coder review may  be revised to meet current compliance requirements. Hildred Laser, MD Hildred Laser, MD 03/29/2021 11:52:16 AM This report has been signed electronically. Number of Addenda: 0

## 2021-03-29 NOTE — Discharge Instructions (Addendum)
Resume usual medications as before. High-fiber diet. No driving for 24 hours. Next colonoscopy in 5 years.    

## 2021-04-04 ENCOUNTER — Encounter (HOSPITAL_COMMUNITY): Payer: Self-pay | Admitting: Internal Medicine

## 2021-04-06 DIAGNOSIS — Z23 Encounter for immunization: Secondary | ICD-10-CM | POA: Diagnosis not present

## 2021-04-10 ENCOUNTER — Telehealth: Payer: Self-pay | Admitting: Urology

## 2021-04-10 ENCOUNTER — Other Ambulatory Visit: Payer: Self-pay

## 2021-04-10 DIAGNOSIS — N138 Other obstructive and reflux uropathy: Secondary | ICD-10-CM

## 2021-04-10 DIAGNOSIS — N401 Enlarged prostate with lower urinary tract symptoms: Secondary | ICD-10-CM

## 2021-04-10 MED ORDER — TAMSULOSIN HCL 0.4 MG PO CAPS: 0.4000 mg | ORAL_CAPSULE | Freq: Every day | ORAL | 11 refills | Status: DC

## 2021-04-10 NOTE — Telephone Encounter (Signed)
Rx sent 

## 2021-04-10 NOTE — Telephone Encounter (Signed)
Patient came  into the office today requesting a new prescription for tamsulosin 0.4 mg to be sent to the [facility] in [place].   Please call with any concerns.

## 2021-05-25 ENCOUNTER — Ambulatory Visit (INDEPENDENT_AMBULATORY_CARE_PROVIDER_SITE_OTHER): Payer: PRIVATE HEALTH INSURANCE | Admitting: Urology

## 2021-05-25 ENCOUNTER — Encounter: Payer: Self-pay | Admitting: Urology

## 2021-05-25 ENCOUNTER — Other Ambulatory Visit: Payer: Self-pay

## 2021-05-25 ENCOUNTER — Telehealth: Payer: Self-pay

## 2021-05-25 VITALS — BP 128/73 | HR 93

## 2021-05-25 DIAGNOSIS — N401 Enlarged prostate with lower urinary tract symptoms: Secondary | ICD-10-CM

## 2021-05-25 DIAGNOSIS — R972 Elevated prostate specific antigen [PSA]: Secondary | ICD-10-CM

## 2021-05-25 DIAGNOSIS — I10 Essential (primary) hypertension: Secondary | ICD-10-CM | POA: Diagnosis not present

## 2021-05-25 DIAGNOSIS — N138 Other obstructive and reflux uropathy: Secondary | ICD-10-CM

## 2021-05-25 DIAGNOSIS — R351 Nocturia: Secondary | ICD-10-CM

## 2021-05-25 LAB — URINALYSIS, ROUTINE W REFLEX MICROSCOPIC
Bilirubin, UA: NEGATIVE
Glucose, UA: NEGATIVE
Leukocytes,UA: NEGATIVE
Nitrite, UA: NEGATIVE
Protein,UA: NEGATIVE
RBC, UA: NEGATIVE
Specific Gravity, UA: 1.02 (ref 1.005–1.030)
Urobilinogen, Ur: 0.2 mg/dL (ref 0.2–1.0)
pH, UA: 5.5 (ref 5.0–7.5)

## 2021-05-25 LAB — MICROSCOPIC EXAMINATION
Bacteria, UA: NONE SEEN
Epithelial Cells (non renal): NONE SEEN /hpf (ref 0–10)
Renal Epithel, UA: NONE SEEN /hpf
WBC, UA: NONE SEEN /hpf (ref 0–5)

## 2021-05-25 MED ORDER — TAMSULOSIN HCL 0.4 MG PO CAPS
0.4000 mg | ORAL_CAPSULE | Freq: Every day | ORAL | 3 refills | Status: DC
Start: 2021-05-25 — End: 2022-06-07

## 2021-05-25 NOTE — Progress Notes (Signed)
Subjective: 1. Enlarged prostate with urinary obstruction   2. Elevated PSA   3. Nocturia    05/25/21.  [name] returns in f/u.  He didn't get the planned PSA prior to this visit.  He continues to void well on tamsulosin.  His IPSS is 5.    02/16/21: [name] returns today in f/u.  His PSA on 12/22/20 was 9.0 with a 6.8% f/t ratio.  He had a prostate MRI on 01/11/21 that  showed a 68ml prostate with  PIRADS 1 categorization.  A repeat PSA prior to this visit is down to 6.1 with an 11.0% f/t ratio. His IPSS is 3.  He remains on tamulosin.    12/22/20: [name] returns for overdue f/u.  He was last seen in 11/19 for a history of an elevated PSA and his PSA then was 4.4.  His PSA had been above 5 in the past.   Since then his PSA has risen slowly and was 4.5 in 8/20, 5.0 in 7/21 and 5.21 on 07/21/20 which was stable.  His UA is unremarkable. He also has a history of colon cancer with bladder invasion that required resection followed by chemo and RTx.  He remains in remission with a low CEA.    He has a colostomy.  He has a history of stones.  He is voiding well and remains on tamsulosin.  He just got the scripts refilled.    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   Arthritis    High cholesterol    Hypertension     Past Surgical History:  Procedure Laterality Date   CATARACT EXTRACTION W/PHACO Left 12/23/2019   Procedure: CATARACT EXTRACTION PHACO AND INTRAOCULAR LENS PLACEMENT LEFT EYE;  Surgeon: Baruch Goldmann, MD;  Location: [facility];  Service: Ophthalmology;  Laterality: Left;  CDE: 14.68   CATARACT EXTRACTION W/PHACO Right 01/08/2020   Procedure: CATARACT EXTRACTION PHACO AND INTRAOCULAR LENS PLACEMENT RIGHT EYE;  Surgeon: Baruch Goldmann, MD;  Location: [facility];  Service: Ophthalmology;  Laterality: Right;  CDE: 18.60   COLON SURGERY     COLONOSCOPY  07/10/2012   Procedure: COLONOSCOPY;  Surgeon: Rogene Houston, MD;  [facility];  Service: Endoscopy;  Laterality: N/A;  1200   COLONOSCOPY N/A 02/29/2016   Procedure: COLONOSCOPY;  Surgeon: Rogene Houston, MD;  [facility];  Service: Endoscopy;  Laterality: N/A;  1200 - moved to 9/6 @ 10:30   COLONOSCOPY WITH PROPOFOL N/A 03/29/2021   Procedure: COLONOSCOPY WITH PROPOFOL;  Surgeon: Rogene Houston, MD;  [facility];  Service: Endoscopy;  Laterality: N/A;  12:30   TOTAL HIP ARTHROPLASTY Right 06/02/2019   Procedure: TOTAL HIP ARTHROPLASTY ANTERIOR APPROACH;  Surgeon: Paralee Cancel, MD;  Location: [facility];  Service: Orthopedics;  Laterality: Right;  70 mins    Social History   Socioeconomic History   Marital status: Married    Spouse name: Not on file   Number of children: Not on file   Years of education: Not on file   Highest education level: Not on file  Occupational History   Not on file  Tobacco Use   Smoking status: Former    Types: Cigarettes    Quit date: 01/22/1973    Years since quitting: 48.3   Smokeless tobacco: Former    Types: Chew, Snuff  Vaping Use   Vaping Use: Never used  Substance and Sexual Activity   Alcohol use: Yes    Comment: occasional glass of wine or beer   Drug use: No   Sexual activity: Not on file  Other Topics Concern   Not on file  Social History Narrative   Not on file   Social Determinants of Health   Financial Resource Strain: Not on file  Food Insecurity: Not on file  Transportation Needs: Not on file  Physical Activity: Not on file  Stress: Not on file  Social Connections: Not on file  Intimate Partner Violence: Not on file    Family History  Problem Relation Age of Onset   Cancer Father     Anti-infectives: Anti-infectives (From admission, onward)    None       Current Outpatient Medications  Medication Sig Dispense Refill   amLODipine (NORVASC) 10 MG tablet Take 10 mg by mouth daily.     Cholecalciferol (VITAMIN D3) 125 MCG (5000 UT)  CAPS Take 5,000 Units by mouth daily.     lisinopril (PRINIVIL,ZESTRIL) 10 MG tablet Take 10 mg by mouth daily.     simvastatin (ZOCOR) 40 MG tablet Take 40 mg by mouth daily.     polyethylene glycol-electrolytes (NULYTELY) 420 g solution peg-electrolyte solution 420 gram oral solution  TAKE 4,000 MLS BY MOUTH AS DIRECTED     tamsulosin (FLOMAX) 0.4 MG CAPS capsule Take 1 capsule (0.4 mg total) by mouth daily. 90 capsule 3   No current facility-administered medications for this visit.   Facility-Administered Medications Ordered in Other Visits  Medication Dose Route Frequency Provider Last Rate Last Admin   neomycin-polymyxin b-dexamethasone (MAXITROL) ophthalmic suspension    PRN Baruch Goldmann, MD   1 drop at 12/23/19 1332     Objective: Vital signs in last 24 hours: BP 128/73   Pulse 93   Intake/Output from previous day: No intake/output data recorded. Intake/Output this shift: @IOTHISSHIFT @   Physical Exam  Lab Results:   I have reviewed his outside labs including the PSA's noted above and his Cr was 1.08 in 07/21/20 Recent Results (from the past 2160 hour(s))  HM COLONOSCOPY     Status: None   Collection Time: 03/29/21 12:00 AM  Result Value Ref Range   HM Colonoscopy See Report (in chart) See Report (in chart), Patient Reported  Urinalysis, Routine w reflex microscopic     Status: Abnormal   Collection Time: 05/25/21 12:16 PM  Result Value Ref Range   Specific Gravity, UA 1.020 1.005 - 1.030   pH, UA 5.5 5.0 - 7.5   Color, UA Yellow Yellow   Appearance Ur Clear Clear   Leukocytes,UA Negative Negative   Protein,UA Negative Negative/Trace   Glucose, UA Negative Negative   Ketones, UA 1+ (A) Negative   RBC, UA Negative Negative   Bilirubin, UA Negative Negative   Urobilinogen, Ur 0.2 0.2 - 1.0 mg/dL   Nitrite, UA Negative Negative   Microscopic Examination See below:   Microscopic Examination     Status: None   Collection Time: 05/25/21 12:16 PM   Urine  Result  Value Ref Range   WBC, UA None seen 0 - 5 /hpf   RBC 0-2 0 - 2 /hpf   Epithelial Cells (non renal) None seen 0 - 10 /hpf   Renal Epithel, UA None seen None seen /hpf   Mucus, UA Present Not Estab.   Bacteria, UA None seen None seen/Few        Studies/Results: No results found.  Assessment/Plan: Elevated PSA.  He missed his blood draw so I will get a PSA today and if stable or down, he will return in 6 months with a PSA.  We have previously discussed options for further evaluation including a biopsy, ExoDx and PSA f/u.   Anal stenosis.  He will need anesthesia if a biopsy is required.   BPH with BOO.  He is doing well on tamsulosin.  I refilled it for 90 days.   Meds ordered this encounter  Medications   tamsulosin (FLOMAX) 0.4 MG CAPS capsule    Sig: Take 1 capsule (0.4 mg total) by mouth daily.    Dispense:  90 capsule    Refill:  3      Orders Placed This Encounter  Procedures   Microscopic Examination   Urinalysis, Routine w reflex microscopic   PSA, total and free   PSA, total and free    Standing Status:   Future    Standing Expiration Date:   05/25/2022     Return in about 6 months (around 11/23/2021) for with PSA.    CC: Dr. Lucia Gaskins.      Irine Seal 05/25/2021 [phone] Patient ID: Walter Orozco, male   DOB: 1946-01-11, 75 y.o.   MRN: [id number] Patient ID: Walter Orozco, male   DOB: Feb 09, 1946, 75 y.o.   MRN: [id number]

## 2021-05-25 NOTE — Progress Notes (Signed)

## 2021-05-26 DIAGNOSIS — Z933 Colostomy status: Secondary | ICD-10-CM | POA: Diagnosis not present

## 2021-05-26 DIAGNOSIS — Z85038 Personal history of other malignant neoplasm of large intestine: Secondary | ICD-10-CM | POA: Diagnosis not present

## 2021-05-26 LAB — PSA, TOTAL AND FREE
PSA, Free Pct: 9.1 %
PSA, Free: 0.49 ng/mL
Prostate Specific Ag, Serum: 5.4 ng/mL — ABNORMAL HIGH (ref 0.0–4.0)

## 2021-05-29 DIAGNOSIS — R972 Elevated prostate specific antigen [PSA]: Secondary | ICD-10-CM | POA: Diagnosis not present

## 2021-05-29 DIAGNOSIS — Z0001 Encounter for general adult medical examination with abnormal findings: Secondary | ICD-10-CM | POA: Diagnosis not present

## 2021-05-29 DIAGNOSIS — E782 Mixed hyperlipidemia: Secondary | ICD-10-CM | POA: Diagnosis not present

## 2021-05-29 DIAGNOSIS — Z96641 Presence of right artificial hip joint: Secondary | ICD-10-CM | POA: Diagnosis not present

## 2021-05-29 DIAGNOSIS — I10 Essential (primary) hypertension: Secondary | ICD-10-CM | POA: Diagnosis not present

## 2021-05-29 DIAGNOSIS — Z933 Colostomy status: Secondary | ICD-10-CM | POA: Diagnosis not present

## 2021-05-29 DIAGNOSIS — Z85038 Personal history of other malignant neoplasm of large intestine: Secondary | ICD-10-CM | POA: Diagnosis not present

## 2021-05-29 DIAGNOSIS — Z23 Encounter for immunization: Secondary | ICD-10-CM | POA: Diagnosis not present

## 2021-05-29 DIAGNOSIS — R7301 Impaired fasting glucose: Secondary | ICD-10-CM | POA: Diagnosis not present

## 2021-05-29 DIAGNOSIS — N3281 Overactive bladder: Secondary | ICD-10-CM | POA: Diagnosis not present

## 2021-06-15 DIAGNOSIS — M170 Bilateral primary osteoarthritis of knee: Secondary | ICD-10-CM | POA: Diagnosis not present

## 2021-06-15 DIAGNOSIS — M1712 Unilateral primary osteoarthritis, left knee: Secondary | ICD-10-CM | POA: Diagnosis not present

## 2021-09-04 DIAGNOSIS — L570 Actinic keratosis: Secondary | ICD-10-CM | POA: Diagnosis not present

## 2021-09-04 DIAGNOSIS — Z1283 Encounter for screening for malignant neoplasm of skin: Secondary | ICD-10-CM | POA: Diagnosis not present

## 2021-09-04 DIAGNOSIS — Z85820 Personal history of malignant melanoma of skin: Secondary | ICD-10-CM | POA: Diagnosis not present

## 2021-09-15 DIAGNOSIS — M170 Bilateral primary osteoarthritis of knee: Secondary | ICD-10-CM | POA: Diagnosis not present

## 2021-09-25 DIAGNOSIS — Z85038 Personal history of other malignant neoplasm of large intestine: Secondary | ICD-10-CM | POA: Diagnosis not present

## 2021-09-25 DIAGNOSIS — Z933 Colostomy status: Secondary | ICD-10-CM | POA: Diagnosis not present

## 2021-11-16 ENCOUNTER — Other Ambulatory Visit: Payer: Medicare HMO

## 2021-11-21 ENCOUNTER — Other Ambulatory Visit: Payer: Medicare HMO

## 2021-11-21 DIAGNOSIS — E782 Mixed hyperlipidemia: Secondary | ICD-10-CM | POA: Diagnosis not present

## 2021-11-21 DIAGNOSIS — R7301 Impaired fasting glucose: Secondary | ICD-10-CM | POA: Diagnosis not present

## 2021-11-21 DIAGNOSIS — R972 Elevated prostate specific antigen [PSA]: Secondary | ICD-10-CM

## 2021-11-22 LAB — PSA, TOTAL AND FREE
PSA, Free Pct: 8.9 %
PSA, Free: 0.51 ng/mL
Prostate Specific Ag, Serum: 5.7 ng/mL — ABNORMAL HIGH (ref 0.0–4.0)

## 2021-11-23 ENCOUNTER — Other Ambulatory Visit: Payer: Medicare HMO

## 2021-11-23 ENCOUNTER — Ambulatory Visit: Payer: Medicare HMO | Admitting: Urology

## 2021-11-27 DIAGNOSIS — R7301 Impaired fasting glucose: Secondary | ICD-10-CM | POA: Diagnosis not present

## 2021-11-27 DIAGNOSIS — E782 Mixed hyperlipidemia: Secondary | ICD-10-CM | POA: Diagnosis not present

## 2021-11-27 DIAGNOSIS — R972 Elevated prostate specific antigen [PSA]: Secondary | ICD-10-CM | POA: Diagnosis not present

## 2021-11-27 DIAGNOSIS — Z933 Colostomy status: Secondary | ICD-10-CM | POA: Diagnosis not present

## 2021-11-27 DIAGNOSIS — Z96641 Presence of right artificial hip joint: Secondary | ICD-10-CM | POA: Diagnosis not present

## 2021-11-27 DIAGNOSIS — I10 Essential (primary) hypertension: Secondary | ICD-10-CM | POA: Diagnosis not present

## 2021-11-27 DIAGNOSIS — N3281 Overactive bladder: Secondary | ICD-10-CM | POA: Diagnosis not present

## 2021-11-27 DIAGNOSIS — Z85038 Personal history of other malignant neoplasm of large intestine: Secondary | ICD-10-CM | POA: Diagnosis not present

## 2021-11-27 DIAGNOSIS — M170 Bilateral primary osteoarthritis of knee: Secondary | ICD-10-CM | POA: Diagnosis not present

## 2021-11-30 ENCOUNTER — Ambulatory Visit (INDEPENDENT_AMBULATORY_CARE_PROVIDER_SITE_OTHER): Payer: Medicare HMO | Admitting: Urology

## 2021-11-30 VITALS — BP 138/69 | HR 82

## 2021-11-30 DIAGNOSIS — N138 Other obstructive and reflux uropathy: Secondary | ICD-10-CM | POA: Diagnosis not present

## 2021-11-30 DIAGNOSIS — N401 Enlarged prostate with lower urinary tract symptoms: Secondary | ICD-10-CM | POA: Diagnosis not present

## 2021-11-30 DIAGNOSIS — R972 Elevated prostate specific antigen [PSA]: Secondary | ICD-10-CM | POA: Diagnosis not present

## 2021-11-30 DIAGNOSIS — R351 Nocturia: Secondary | ICD-10-CM

## 2021-11-30 LAB — URINALYSIS, ROUTINE W REFLEX MICROSCOPIC
Bilirubin, UA: NEGATIVE
Glucose, UA: NEGATIVE
Ketones, UA: NEGATIVE
Leukocytes,UA: NEGATIVE
Nitrite, UA: NEGATIVE
Protein,UA: NEGATIVE
RBC, UA: NEGATIVE
Specific Gravity, UA: 1.015 (ref 1.005–1.030)
Urobilinogen, Ur: 0.2 mg/dL (ref 0.2–1.0)
pH, UA: 5.5 (ref 5.0–7.5)

## 2021-11-30 NOTE — Progress Notes (Signed)
Subjective: 1. Elevated PSA   2. Enlarged prostate with urinary obstruction   3. Nocturia    11/30/21: Husam returns today in f/u.  His PSA was stable at 5.7 with an 8.9% f/t ratio on 11/11/21 and 5.4 with a 9.1% f/t ratio on 05/25/21.  It was 6.1 in 8/22 and 9.0 in 6/22.   He remains on tamsulosin.  His IPSS is 3 with nocturia x 1.   05/25/21.  Mr. Corsino returns in f/u.  He didn't get the planned PSA prior to this visit.  He continues to void well on tamsulosin.  His IPSS is 5.    02/16/21: Mr. Guadamuz returns today in f/u.  His PSA on 12/22/20 was 9.0 with a 6.8% f/t ratio.  He had a prostate MRI on 01/11/21 that  showed a 61m prostate with  PIRADS 1 categorization.  A repeat PSA prior to this visit is down to 6.1 with an 11.0% f/t ratio. His IPSS is 3.  He remains on tamulosin.    12/22/20: Mr. PScibiliareturns for overdue f/u.  He was last seen in 11/19 for a history of an elevated PSA and his PSA then was 4.4.  His PSA had been above 5 in the past.   Since then his PSA has risen slowly and was 4.5 in 8/20, 5.0 in 7/21 and 5.21 on 07/21/20 which was stable.  His UA is unremarkable. He also has a history of colon cancer with bladder invasion that required resection followed by chemo and RTx.  He remains in remission with a low CEA.    He has a colostomy.  He has a history of stones.  He is voiding well and remains on tamsulosin.  He just got the scripts refilled.    IPSS     Row Name 11/30/21 1000         International Prostate Symptom Score   How often have you had the sensation of not emptying your bladder? Not at All     How often have you had to urinate less than every two hours? Less than 1 in 5 times     How often have you found you stopped and started again several times when you urinated? Not at All     How often have you found it difficult to postpone urination? Less than 1 in 5 times     How often have you had a weak urinary stream? Not at All     How often have you had to strain to start  urination? Not at All     How many times did you typically get up at night to urinate? 1 Time     Total IPSS Score 3       Quality of Life due to urinary symptoms   If you were to spend the rest of your life with your urinary condition just the way it is now how would you feel about that? Delighted              ROS:  Review of Systems  All other systems reviewed and are negative.   No Known Allergies  Past Medical History:  Diagnosis Date   [place]   Arthritis    High cholesterol    Hypertension     Past Surgical History:  Procedure Laterality Date   CATARACT EXTRACTION W/PHACO Left 12/23/2019   Procedure: CATARACT EXTRACTION PHACO AND INTRAOCULAR LENS PLACEMENT LEFT EYE;  Surgeon: WBaruch Goldmann MD;  Location: [facility];  Service: Ophthalmology;  Laterality: Left;  CDE: 14.68   CATARACT EXTRACTION W/PHACO Right 01/08/2020   Procedure: CATARACT EXTRACTION PHACO AND INTRAOCULAR LENS PLACEMENT RIGHT EYE;  Surgeon: Baruch Goldmann, MD;  Location: [facility];  Service: Ophthalmology;  Laterality: Right;  CDE: 18.60   COLON SURGERY     COLONOSCOPY  07/10/2012   Procedure: COLONOSCOPY;  Surgeon: Rogene Houston, MD;  [facility];  Service: Endoscopy;  Laterality: N/A;  1200   COLONOSCOPY N/A 02/29/2016   Procedure: COLONOSCOPY;  Surgeon: Rogene Houston, MD;  [facility];  Service: Endoscopy;  Laterality: N/A;  1200 - moved to 9/6 @ 10:30   COLONOSCOPY WITH PROPOFOL N/A 03/29/2021   Procedure: COLONOSCOPY WITH PROPOFOL;  Surgeon: Rogene Houston, MD;  [facility];  Service: Endoscopy;  Laterality: N/A;  12:30   TOTAL HIP ARTHROPLASTY Right 06/02/2019   Procedure: TOTAL HIP ARTHROPLASTY ANTERIOR APPROACH;  Surgeon: Paralee Cancel, MD;  Location: [facility];  Service: Orthopedics;  Laterality: Right;  70 mins    Social History   Socioeconomic History   Marital status: Married    Spouse name: Not on file   Number of  children: Not on file   Years of education: Not on file   Highest education level: Not on file  Occupational History   Not on file  Tobacco Use   Smoking status: Former    Types: Cigarettes    Quit date: 01/22/1973    Years since quitting: 48.8   Smokeless tobacco: Former    Types: Chew, Snuff  Vaping Use   Vaping Use: Never used  Substance and Sexual Activity   Alcohol use: Yes    Comment: occasional glass of wine or beer   Drug use: No   Sexual activity: Not on file  Other Topics Concern   Not on file  Social History Narrative   Not on file   Social Determinants of Health   Financial Resource Strain: Not on file  Food Insecurity: Not on file  Transportation Needs: Not on file  Physical Activity: Not on file  Stress: Not on file  Social Connections: Not on file  Intimate Partner Violence: Not on file    Family History  Problem Relation Age of Onset   Cancer Father     Anti-infectives: Anti-infectives (From admission, onward)    None       Current Outpatient Medications  Medication Sig Dispense Refill   amLODipine (NORVASC) 10 MG tablet Take 10 mg by mouth daily.     Cholecalciferol (VITAMIN D3) 125 MCG (5000 UT) CAPS Take 5,000 Units by mouth daily.     lisinopril (PRINIVIL,ZESTRIL) 10 MG tablet Take 10 mg by mouth daily.     simvastatin (ZOCOR) 40 MG tablet Take 40 mg by mouth daily.     tamsulosin (FLOMAX) 0.4 MG CAPS capsule Take 1 capsule (0.4 mg total) by mouth daily. 90 capsule 3   No current facility-administered medications for this visit.   Facility-Administered Medications Ordered in Other Visits  Medication Dose Route Frequency Provider Last Rate Last Admin   neomycin-polymyxin b-dexamethasone (MAXITROL) ophthalmic suspension    PRN Baruch Goldmann, MD   1 drop at 12/23/19 1332     Objective: Vital signs in last 24 hours: BP 138/69   Pulse 82   Intake/Output from previous day: No intake/output data recorded. Intake/Output this  shift: '@IOTHISSHIFT'$ @   Physical Exam Vitals reviewed.  Constitutional:  Appearance: Normal appearance.  Neurological:     Mental Status: He is alert.     Lab Results:   I have reviewed his outside labs including the PSA's noted above and his Cr was 1.08 in 07/21/20 Recent Results (from the past 2160 hour(s))  PSA, total and free     Status: Abnormal   Collection Time: 11/21/21 10:38 AM  Result Value Ref Range   Prostate Specific Ag, Serum 5.7 (H) 0.0 - 4.0 ng/mL    Comment: Roche ECLIA methodology. According to the American Urological Association, Serum PSA should decrease and remain at undetectable levels after radical prostatectomy. The AUA defines biochemical recurrence as an initial PSA value 0.2 ng/mL or greater followed by a subsequent confirmatory PSA value 0.2 ng/mL or greater. Values obtained with different assay methods or kits cannot be used interchangeably. Results cannot be interpreted as absolute evidence of the presence or absence of malignant disease.    PSA, Free 0.51 N/A ng/mL    Comment: Roche ECLIA methodology.   PSA, Free Pct 8.9 %    Comment: The table below lists the probability of prostate cancer for men with non-suspicious DRE results and total PSA between 4 and 10 ng/mL, by patient age Ricci Barker, [place], [id number]).                   % Free PSA       50-64 yr        65-75 yr                   0.00-10.00%        56%             55%                  10.01-15.00%        24%             35%                  15.01-20.00%        17%             23%                  20.01-25.00%        10%             20%                       >25.00%         5%              9% Please note:  Catalona et al did not make specific               recommendations regarding the use of               percent free PSA for any other population               of men.   Urinalysis, Routine w reflex microscopic     Status: None   Collection Time: 11/30/21 11:14 AM   Result Value Ref Range   Specific Gravity, UA 1.015 1.005 - 1.030   pH, UA 5.5 5.0 - 7.5   Color, UA Yellow Yellow   Appearance Ur Clear Clear   Leukocytes,UA Negative Negative   Protein,UA Negative Negative/Trace   Glucose, UA Negative Negative   Ketones, UA Negative  Negative   RBC, UA Negative Negative   Bilirubin, UA Negative Negative   Urobilinogen, Ur 0.2 0.2 - 1.0 mg/dL   Nitrite, UA Negative Negative   Microscopic Examination Comment     Comment: Microscopic follows if indicated.        Studies/Results: No results found.    Assessment/Plan: Elevated PSA.  His PSA remains mildly elevated but down from the prior high but does have a low f/t ratio.   We have previously discussed options for further evaluation including a biopsy, ExoDx and PSA f/u.   Anal stenosis.  He will need anesthesia if a biopsy is required.   BPH with BOO.  He is doing well on tamsulosin.  He will continue that.    No orders of the defined types were placed in this encounter.     Orders Placed This Encounter  Procedures   Urinalysis, Routine w reflex microscopic   PSA, total and free    Standing Status:   Future    Standing Expiration Date:   12/01/2022     Return in about 6 months (around 06/01/2022) for with PSA.    CC: Dr. Lucia Gaskins.      Irine Seal 12/01/2021 [phone] Patient ID: Dewain Penning, male   DOB: 1946-04-30, 76 y.o.   MRN: [id number] Patient ID: TARL CEPHAS, male   DOB: 1946-02-04, 76 y.o.   MRN: [id number]

## 2021-12-01 ENCOUNTER — Encounter: Payer: Self-pay | Admitting: Urology

## 2021-12-06 DIAGNOSIS — M25562 Pain in left knee: Secondary | ICD-10-CM | POA: Diagnosis not present

## 2021-12-06 DIAGNOSIS — M1711 Unilateral primary osteoarthritis, right knee: Secondary | ICD-10-CM | POA: Diagnosis not present

## 2021-12-06 DIAGNOSIS — M170 Bilateral primary osteoarthritis of knee: Secondary | ICD-10-CM | POA: Diagnosis not present

## 2021-12-06 DIAGNOSIS — M1712 Unilateral primary osteoarthritis, left knee: Secondary | ICD-10-CM | POA: Diagnosis not present

## 2021-12-26 DIAGNOSIS — Z933 Colostomy status: Secondary | ICD-10-CM | POA: Diagnosis not present

## 2021-12-26 DIAGNOSIS — Z85038 Personal history of other malignant neoplasm of large intestine: Secondary | ICD-10-CM | POA: Diagnosis not present

## 2022-02-27 ENCOUNTER — Inpatient Hospital Stay: Payer: Medicare HMO | Attending: Hematology

## 2022-02-27 DIAGNOSIS — Z87891 Personal history of nicotine dependence: Secondary | ICD-10-CM | POA: Diagnosis not present

## 2022-02-27 DIAGNOSIS — Z809 Family history of malignant neoplasm, unspecified: Secondary | ICD-10-CM | POA: Diagnosis not present

## 2022-02-27 DIAGNOSIS — R972 Elevated prostate specific antigen [PSA]: Secondary | ICD-10-CM | POA: Insufficient documentation

## 2022-02-27 DIAGNOSIS — I10 Essential (primary) hypertension: Secondary | ICD-10-CM | POA: Insufficient documentation

## 2022-02-27 DIAGNOSIS — C187 Malignant neoplasm of sigmoid colon: Secondary | ICD-10-CM | POA: Diagnosis not present

## 2022-02-27 LAB — CBC WITH DIFFERENTIAL/PLATELET
Abs Immature Granulocytes: 0.02 10*3/uL (ref 0.00–0.07)
Basophils Absolute: 0 10*3/uL (ref 0.0–0.1)
Basophils Relative: 0 %
Eosinophils Absolute: 0.1 10*3/uL (ref 0.0–0.5)
Eosinophils Relative: 1 %
HCT: 43.5 % (ref 39.0–52.0)
Hemoglobin: 15.1 g/dL (ref 13.0–17.0)
Immature Granulocytes: 0 %
Lymphocytes Relative: 15 %
Lymphs Abs: 1.1 10*3/uL (ref 0.7–4.0)
MCH: 31.9 pg (ref 26.0–34.0)
MCHC: 34.7 g/dL (ref 30.0–36.0)
MCV: 91.8 fL (ref 80.0–100.0)
Monocytes Absolute: 0.8 10*3/uL (ref 0.1–1.0)
Monocytes Relative: 12 %
Neutro Abs: 4.9 10*3/uL (ref 1.7–7.7)
Neutrophils Relative %: 72 %
Platelets: 167 10*3/uL (ref 150–400)
RBC: 4.74 MIL/uL (ref 4.22–5.81)
RDW: 12.7 % (ref 11.5–15.5)
WBC: 6.8 10*3/uL (ref 4.0–10.5)
nRBC: 0 % (ref 0.0–0.2)

## 2022-02-27 LAB — COMPREHENSIVE METABOLIC PANEL
ALT: 29 U/L (ref 0–44)
AST: 24 U/L (ref 15–41)
Albumin: 4 g/dL (ref 3.5–5.0)
Alkaline Phosphatase: 64 U/L (ref 38–126)
Anion gap: 6 (ref 5–15)
BUN: 11 mg/dL (ref 8–23)
CO2: 25 mmol/L (ref 22–32)
Calcium: 9.1 mg/dL (ref 8.9–10.3)
Chloride: 108 mmol/L (ref 98–111)
Creatinine, Ser: 0.78 mg/dL (ref 0.61–1.24)
GFR, Estimated: >60 mL/min (ref >60)
Glucose, Bld: 97 mg/dL (ref 70–99)
Potassium: 3.4 mmol/L — ABNORMAL LOW (ref 3.5–5.1)
Sodium: 139 mmol/L (ref 135–145)
Total Bilirubin: 0.9 mg/dL (ref 0.3–1.2)
Total Protein: 6.8 g/dL (ref 6.5–8.1)

## 2022-02-28 LAB — CEA: CEA: 1.7 ng/mL (ref 0.0–4.7)

## 2022-03-05 DIAGNOSIS — M170 Bilateral primary osteoarthritis of knee: Secondary | ICD-10-CM | POA: Diagnosis not present

## 2022-03-06 ENCOUNTER — Ambulatory Visit (HOSPITAL_COMMUNITY): Payer: PRIVATE HEALTH INSURANCE | Admitting: Hematology

## 2022-03-07 DIAGNOSIS — H04123 Dry eye syndrome of bilateral lacrimal glands: Secondary | ICD-10-CM | POA: Diagnosis not present

## 2022-03-13 DIAGNOSIS — L570 Actinic keratosis: Secondary | ICD-10-CM | POA: Diagnosis not present

## 2022-03-13 DIAGNOSIS — D239 Other benign neoplasm of skin, unspecified: Secondary | ICD-10-CM | POA: Diagnosis not present

## 2022-03-13 DIAGNOSIS — Z85828 Personal history of other malignant neoplasm of skin: Secondary | ICD-10-CM | POA: Diagnosis not present

## 2022-03-19 ENCOUNTER — Inpatient Hospital Stay (HOSPITAL_BASED_OUTPATIENT_CLINIC_OR_DEPARTMENT_OTHER): Payer: Medicare HMO | Admitting: Hematology

## 2022-03-19 VITALS — BP 145/79 | HR 103 | Temp 98.1°F | Resp 18 | Ht 65.0 in | Wt 159.9 lb

## 2022-03-19 DIAGNOSIS — R972 Elevated prostate specific antigen [PSA]: Secondary | ICD-10-CM | POA: Diagnosis not present

## 2022-03-19 DIAGNOSIS — C187 Malignant neoplasm of sigmoid colon: Secondary | ICD-10-CM | POA: Diagnosis not present

## 2022-03-19 DIAGNOSIS — Z87891 Personal history of nicotine dependence: Secondary | ICD-10-CM | POA: Diagnosis not present

## 2022-03-19 DIAGNOSIS — I10 Essential (primary) hypertension: Secondary | ICD-10-CM | POA: Diagnosis not present

## 2022-03-19 DIAGNOSIS — Z809 Family history of malignant neoplasm, unspecified: Secondary | ICD-10-CM | POA: Diagnosis not present

## 2022-03-19 NOTE — Patient Instructions (Addendum)
[facility]  Discharge Instructions  You were seen and examined today by Dr. Delton Coombes.  Dr. Delton Coombes discussed your most recent lab work and everything looks great. Keep follow up with your primary care Dr. Nevada Crane.    Follow-up as needed.    Thank you for choosing [facility] to provide your oncology and hematology care.   To afford each patient quality time with our provider, please arrive at least 15 minutes before your scheduled appointment time. You may need to reschedule your appointment if you arrive late (10 or more minutes). Arriving late affects you and other patients whose appointments are after yours.  Also, if you miss three or more appointments without notifying the office, you may be dismissed from the clinic at the provider's discretion.    Again, thank you for choosing  [facility].  Our hope is that these requests will decrease the amount of time that you wait before being seen by our physicians.   If you have a lab appointment with the [facility] please come in thru the Main Entrance and check in at the main information desk.           _____________________________________________________________  Should you have questions after your visit to [facility], please contact our office at [phone] and follow the prompts.  Our office hours are 8:00 a.m. to 4:30 p.m. Monday - Thursday and 8:00 a.m. to 2:30 p.m. Friday.  Please note that voicemails left after 4:00 p.m. may not be returned until the following business day.  We are closed weekends and all major holidays.  You do have access to a nurse 24-7, just call the main number to the clinic [phone] and do not press any options, hold on the line and a nurse will answer the phone.    For prescription refill requests, have your pharmacy contact our office and allow 72 hours.    Masks are optional in the cancer centers. If you would  like for your care team to wear a mask while they are taking care of you, please let them know. You may have one support person who is at least 74 years old accompany you for your appointments.

## 2022-03-19 NOTE — Progress Notes (Signed)
Walter Orozco [address] [place] [address]   CLINIC:  Medical Oncology/Hematology  PCP:  Celene Squibb, MD [address] [place] [address] [phone]   REASON FOR VISIT:  Follow-up for sigmoid colon cancer  PRIOR THERAPY:  1. Sigmoid resection on 07/25/2005 by Dr. Romona Curls and Dr. Michela Pitcher. 2. Chemoradiation with oxaliplatin and bevacizumab.  NGS Results: not done  CURRENT THERAPY: surveillance  BRIEF ONCOLOGIC HISTORY:  Oncology History   No history exists.    CANCER STAGING:  Cancer Staging  Adenocarcinoma of sigmoid colon [facility]) Staging form: Colon and Rectum, AJCC 7th Edition - Clinical: Stage IIIC (T4b, N1, M0) - Signed by Baird Cancer, PA on 11/26/2011   INTERVAL HISTORY:  [name], a 76 y.o. male, seen for follow-up of sigmoid colon cancer.  Denies any bleeding into the colostomy bag.  Had colonoscopy last time in October 2022.  No change in bowel habits.  No new onset pains.  REVIEW OF SYSTEMS:  Review of Systems  Constitutional:  Negative for fatigue.  Gastrointestinal:  Negative for blood in stool, constipation and diarrhea.  All other systems reviewed and are negative.   PAST MEDICAL/SURGICAL HISTORY:  Past Medical History:  Diagnosis Date   Adenocarcinoma of sigmoid colon ([place]) 06/11/2011   Arthritis    High cholesterol    Hypertension    Past Surgical History:  Procedure Laterality Date   CATARACT EXTRACTION W/PHACO Left 12/23/2019   Procedure: CATARACT EXTRACTION PHACO AND INTRAOCULAR LENS PLACEMENT LEFT EYE;  Surgeon: Baruch Goldmann, MD;  Location: [facility];  Service: Ophthalmology;  Laterality: Left;  CDE: 14.68   CATARACT EXTRACTION W/PHACO Right 01/08/2020   Procedure: CATARACT EXTRACTION PHACO AND INTRAOCULAR LENS PLACEMENT RIGHT EYE;  Surgeon: Baruch Goldmann, MD;  Location: [facility];  Service: Ophthalmology;  Laterality: Right;  CDE: 18.60   COLON SURGERY     COLONOSCOPY  07/10/2012   Procedure:  COLONOSCOPY;  Surgeon: Rogene Houston, MD;  [facility];  Service: Endoscopy;  Laterality: N/A;  1200   COLONOSCOPY N/A 02/29/2016   Procedure: COLONOSCOPY;  Surgeon: Rogene Houston, MD;  [facility];  Service: Endoscopy;  Laterality: N/A;  1200 - moved to 9/6 @ 10:30   COLONOSCOPY WITH PROPOFOL N/A 03/29/2021   Procedure: COLONOSCOPY WITH PROPOFOL;  Surgeon: Rogene Houston, MD;  [facility];  Service: Endoscopy;  Laterality: N/A;  12:30   TOTAL HIP ARTHROPLASTY Right 06/02/2019   Procedure: TOTAL HIP ARTHROPLASTY ANTERIOR APPROACH;  Surgeon: Paralee Cancel, MD;  Location: [facility];  Service: Orthopedics;  Laterality: Right;  70 mins    SOCIAL HISTORY:  Social History   Socioeconomic History   Marital status: Married    Spouse name: Not on file   Number of children: Not on file   Years of education: Not on file   Highest education level: Not on file  Occupational History   Not on file  Tobacco Use   Smoking status: Former    Types: Cigarettes    Quit date: 01/22/1973    Years since quitting: 49.1   Smokeless tobacco: Former    Types: Chew, Snuff  Vaping Use   Vaping Use: Never used  Substance and Sexual Activity   Alcohol use: Yes    Comment: occasional glass of wine or beer   Drug use: No   Sexual activity: Not on file  Other Topics Concern   Not on file  Social History Narrative   Not on file   Social Determinants of Health   Financial Resource Strain: Not on file  Food Insecurity: Not on file  Transportation Needs: Not on file  Physical Activity: Not on file  Stress: Not on file  Social Connections: Not on file  Intimate Partner Violence: Not on file    FAMILY HISTORY:  Family History  Problem Relation Age of Onset   Cancer Father     CURRENT MEDICATIONS:  Current Outpatient Medications  Medication Sig Dispense Refill   amLODipine (NORVASC) 10 MG tablet Take 10 mg by mouth daily.     Cholecalciferol (VITAMIN D3) 125 MCG  (5000 UT) CAPS Take 5,000 Units by mouth daily.     lisinopril (PRINIVIL,ZESTRIL) 10 MG tablet Take 10 mg by mouth daily.     simvastatin (ZOCOR) 40 MG tablet Take 40 mg by mouth daily.     tamsulosin (FLOMAX) 0.4 MG CAPS capsule Take 1 capsule (0.4 mg total) by mouth daily. 90 capsule 3   No current facility-administered medications for this visit.   Facility-Administered Medications Ordered in Other Visits  Medication Dose Route Frequency Provider Last Rate Last Admin   neomycin-polymyxin b-dexamethasone (MAXITROL) ophthalmic suspension    PRN Baruch Goldmann, MD   1 drop at 12/23/19 1332    ALLERGIES:  No Known Allergies  PHYSICAL EXAM:  Performance status (ECOG): 0 - Asymptomatic  Vitals:   03/19/22 1416  BP: (!) 145/79  Pulse: (!) 103  Resp: 18  Temp: 98.1 F (36.7 C)  SpO2: 97%   Wt Readings from Last 3 Encounters:  03/19/22 159 lb 14.4 oz (72.5 kg)  03/24/21 165 lb (74.8 kg)  02/28/21 167 lb 13.8 oz (76.1 kg)   Physical Exam Vitals reviewed.  Constitutional:      Appearance: Normal appearance.  Cardiovascular:     Rate and Rhythm: Normal rate and regular rhythm.     Pulses: Normal pulses.     Heart sounds: Normal heart sounds.  Pulmonary:     Effort: Pulmonary effort is normal.     Breath sounds: Normal breath sounds.  Neurological:     General: No focal deficit present.     Mental Status: He is alert and oriented to person, place, and time.  Psychiatric:        Mood and Affect: Mood normal.        Behavior: Behavior normal.      LABORATORY DATA:  I have reviewed the labs as listed.     Latest Ref Rng & Units 02/27/2022    1:50 PM 02/21/2021   10:52 AM 02/01/2020   10:43 AM  CBC  WBC 4.0 - 10.5 K/uL 6.8  6.7  7.1   Hemoglobin 13.0 - 17.0 g/dL 15.1  16.3  15.4   Hematocrit 39.0 - 52.0 % 43.5  47.5  45.8   Platelets 150 - 400 K/uL 167  185  183       Latest Ref Rng & Units 02/27/2022    1:50 PM 02/21/2021   10:52 AM 02/01/2020   10:43 AM  CMP  Glucose  70 - 99 mg/dL 97  101  105   BUN 8 - 23 mg/dL '11  12  12   '$ Creatinine 0.61 - 1.24 mg/dL 0.78  0.91  0.81   Sodium 135 - 145 mmol/L 139  139  139   Potassium 3.5 - 5.1 mmol/L 3.4  5.0  4.4   Chloride 98 - 111 mmol/L 108  105  104   CO2 22 - 32 mmol/L '25  28  26   '$ Calcium 8.9 - 10.3 mg/dL 9.1  9.5  9.5   Total Protein 6.5 - 8.1 g/dL 6.8  7.1  7.1   Total Bilirubin 0.3 - 1.2 mg/dL 0.9  0.5  0.7   Alkaline Phos 38 - 126 U/L 64  66  67   AST 15 - 41 U/L '24  22  18   '$ ALT 0 - 44 U/L '29  25  22     '$ DIAGNOSTIC IMAGING:  I have independently reviewed the scans and discussed with the patient. No results found.   ASSESSMENT:    1.  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  Chemoradiation therapy followed by oxaliplatin-based chemo with bevacizumab. - Last colonoscopy on 03/29/2021 showed diverticulosis.   PLAN:  1.  Adenocarcinoma of sigmoid colon: - Denies any bleeding into colostomy bag.  Denies any change in bowel habits. - Reviewed labs from 02/27/2022 which showed normal LFTs.  CEA was 1.7.  CBC was completely normal. - RTC as needed.  2.  Elevated PSA: - Last PSA was 5.7.  Continue follow-up with Dr. Jeffie Pollock.   Orders placed this encounter:  No orders of the defined types were placed in this encounter.    Derek Jack, MD [place] [phone]

## 2022-03-27 DIAGNOSIS — Z933 Colostomy status: Secondary | ICD-10-CM | POA: Diagnosis not present

## 2022-03-27 DIAGNOSIS — Z85038 Personal history of other malignant neoplasm of large intestine: Secondary | ICD-10-CM | POA: Diagnosis not present

## 2022-03-30 DIAGNOSIS — Z23 Encounter for immunization: Secondary | ICD-10-CM | POA: Diagnosis not present

## 2022-06-01 ENCOUNTER — Other Ambulatory Visit: Payer: Medicare HMO

## 2022-06-01 DIAGNOSIS — R972 Elevated prostate specific antigen [PSA]: Secondary | ICD-10-CM | POA: Diagnosis not present

## 2022-06-04 DIAGNOSIS — R7301 Impaired fasting glucose: Secondary | ICD-10-CM | POA: Diagnosis not present

## 2022-06-04 DIAGNOSIS — E782 Mixed hyperlipidemia: Secondary | ICD-10-CM | POA: Diagnosis not present

## 2022-06-04 DIAGNOSIS — M170 Bilateral primary osteoarthritis of knee: Secondary | ICD-10-CM | POA: Diagnosis not present

## 2022-06-06 LAB — PSA, TOTAL AND FREE
PSA, Free Pct: 6.5 %
PSA, Free: 0.34 ng/mL
Prostate Specific Ag, Serum: 5.2 ng/mL — ABNORMAL HIGH (ref 0.0–4.0)

## 2022-06-07 ENCOUNTER — Encounter: Payer: Self-pay | Admitting: Urology

## 2022-06-07 ENCOUNTER — Ambulatory Visit (INDEPENDENT_AMBULATORY_CARE_PROVIDER_SITE_OTHER): Payer: Medicare HMO | Admitting: Urology

## 2022-06-07 VITALS — BP 122/68 | HR 87

## 2022-06-07 DIAGNOSIS — R351 Nocturia: Secondary | ICD-10-CM

## 2022-06-07 DIAGNOSIS — R972 Elevated prostate specific antigen [PSA]: Secondary | ICD-10-CM | POA: Diagnosis not present

## 2022-06-07 DIAGNOSIS — N138 Other obstructive and reflux uropathy: Secondary | ICD-10-CM

## 2022-06-07 DIAGNOSIS — Z87442 Personal history of urinary calculi: Secondary | ICD-10-CM

## 2022-06-07 DIAGNOSIS — N401 Enlarged prostate with lower urinary tract symptoms: Secondary | ICD-10-CM

## 2022-06-07 DIAGNOSIS — K624 Stenosis of anus and rectum: Secondary | ICD-10-CM | POA: Diagnosis not present

## 2022-06-07 LAB — URINALYSIS, ROUTINE W REFLEX MICROSCOPIC
Bilirubin, UA: NEGATIVE
Glucose, UA: NEGATIVE
Ketones, UA: NEGATIVE
Leukocytes,UA: NEGATIVE
Nitrite, UA: NEGATIVE
Protein,UA: NEGATIVE
RBC, UA: NEGATIVE
Specific Gravity, UA: 1.015 (ref 1.005–1.030)
Urobilinogen, Ur: 0.2 mg/dL (ref 0.2–1.0)
pH, UA: 7 (ref 5.0–7.5)

## 2022-06-07 MED ORDER — TAMSULOSIN HCL 0.4 MG PO CAPS: 0.4000 mg | ORAL_CAPSULE | Freq: Every day | ORAL | 3 refills | Status: DC

## 2022-06-07 NOTE — Progress Notes (Signed)
Subjective: 1. Elevated PSA   2. Enlarged prostate with urinary obstruction   3. Nocturia   4. History of nephrolithiasis    06/07/22: Walter Orozco returns today in f/u.  His PSA is down slightly to 5.2 with a 6.5% f/t ratio. He remains on tamsulosin.  His IPSS is 6 with nocturia x 2.  He has been released from follow up for his colon cancer.  His UA is clear.    11/30/21: Achillies returns today in f/u.  His PSA was stable at 5.7 with an 8.9% f/t ratio on 11/11/21 and 5.4 with a 9.1% f/t ratio on 05/25/21.  It was 6.1 in 8/22 and 9.0 in 6/22.   He remains on tamsulosin.  His IPSS is 3 with nocturia x 1.   05/25/21.  Mr. Hands returns in f/u.  He didn't get the planned PSA prior to this visit.  He continues to void well on tamsulosin.  His IPSS is 5.    02/16/21: Mr. Stanforth returns today in f/u.  His PSA on 12/22/20 was 9.0 with a 6.8% f/t ratio.  He had a prostate MRI on 01/11/21 that  showed a 62m prostate with  PIRADS 1 categorization.  A repeat PSA prior to this visit is down to 6.1 with an 11.0% f/t ratio. His IPSS is 3.  He remains on tamulosin.    12/22/20: Mr. PFronczakreturns for overdue f/u.  He was last seen in 11/19 for a history of an elevated PSA and his PSA then was 4.4.  His PSA had been above 5 in the past.   Since then his PSA has risen slowly and was 4.5 in 8/20, 5.0 in 7/21 and 5.21 on 07/21/20 which was stable.  His UA is unremarkable. He also has a history of colon cancer with bladder invasion that required resection followed by chemo and RTx.  He remains in remission with a low CEA.    He has a colostomy.  He has a history of stones.  He is voiding well and remains on tamsulosin.  He just got the scripts refilled.      ROS:  Review of Systems  All other systems reviewed and are negative.   No Known Allergies  Past Medical History:  Diagnosis Date   [place] 06/11[place]2011   Arthritis    High cholesterol    Hypertension     Past Surgical History:   Procedure Laterality Date   CATARACT EXTRACTION W/PHACO Left 12/23/2019   Procedure: CATARACT EXTRACTION PHACO AND INTRAOCULAR LENS PLACEMENT LEFT EYE;  Surgeon: WBaruch Goldmann MD;  [facility];  Service: Ophthalmology;  Laterality: Left;  CDE: 14.68   CATARACT EXTRACTION W/PHACO Right 01/08/2020   Procedure: CATARACT EXTRACTION PHACO AND INTRAOCULAR LENS PLACEMENT RIGHT EYE;  Surgeon: WBaruch Goldmann MD;  Location: [facility];  Service: Ophthalmology;  Laterality: Right;  CDE: 18.60   COLON SURGERY     COLONOSCOPY  07/10/2012   Procedure: COLONOSCOPY;  Surgeon: NRogene Houston MD;  [facility];  Service: Endoscopy;  Laterality: N/A;  1200   COLONOSCOPY N/A 02/29/2016   Procedure: COLONOSCOPY;  Surgeon: NRogene Houston MD;  [facility];  Service: Endoscopy;  Laterality: N/A;  1200 - moved to 9/6 @ 10:30   COLONOSCOPY WITH PROPOFOL N/A 03/29/2021   Procedure: COLONOSCOPY WITH PROPOFOL;  Surgeon: RRogene Houston MD;  [facility];  Service: Endoscopy;  Laterality: N/A;  12:30   TOTAL HIP ARTHROPLASTY Right  06/02/2019   Procedure: TOTAL HIP ARTHROPLASTY ANTERIOR APPROACH;  Surgeon: Paralee Cancel, MD;  [facility];  Service: Orthopedics;  Laterality: Right;  70 mins    Social History   Socioeconomic History   Marital status: Married    Spouse name: Not on file   Number of children: Not on file   Years of education: Not on file   Highest education level: Not on file  Occupational History   Not on file  Tobacco Use   Smoking status: Former    Types: Cigarettes    Quit date: 01/22/1973    Years since quitting: 49.4   Smokeless tobacco: Former    Types: Chew, Snuff  Vaping Use   Vaping Use: Never used  Substance and Sexual Activity   Alcohol use: Yes    Comment: occasional glass of wine or beer   Drug use: No   Sexual activity: Not on file  Other Topics Concern   Not on file  Social History Narrative   Not on file   Social Determinants of  Health   Financial Resource Strain: Not on file  Food Insecurity: Not on file  Transportation Needs: Not on file  Physical Activity: Not on file  Stress: Not on file  Social Connections: Not on file  Intimate Partner Violence: Not on file    Family History  Problem Relation Age of Onset   Cancer Father     Anti-infectives: Anti-infectives (From admission, onward)    None       Current Outpatient Medications  Medication Sig Dispense Refill   amLODipine (NORVASC) 10 MG tablet Take 10 mg by mouth daily.     Cholecalciferol (VITAMIN D3) 125 MCG (5000 UT) CAPS Take 5,000 Units by mouth daily.     lisinopril (PRINIVIL,ZESTRIL) 10 MG tablet Take 10 mg by mouth daily.     simvastatin (ZOCOR) 40 MG tablet Take 40 mg by mouth daily.     tamsulosin (FLOMAX) 0.4 MG CAPS capsule Take 1 capsule (0.4 mg total) by mouth daily. 100 capsule 3   No current facility-administered medications for this visit.   Facility-Administered Medications Ordered in Other Visits  Medication Dose Route Frequency Provider Last Rate Last Admin   neomycin-polymyxin b-dexamethasone (MAXITROL) ophthalmic suspension    PRN Baruch Goldmann, MD   1 drop at 12/23/19 1332     Objective: Vital signs in last 24 hours: BP 122/68   Pulse 87   Intake/Output from previous day: No intake/output data recorded. Intake/Output this shift: '@IOTHISSHIFT'$ @   Physical Exam Vitals reviewed.  Constitutional:      Appearance: Normal appearance.  Neurological:     Mental Status: He is alert.     Lab Results:   I have reviewed his outside labs including the PSA's noted above and his Cr was 1.08 in 07/21/20 Recent Results (from the past 2160 hour(s))  PSA, total and free     Status: Abnormal   Collection Time: 06/01/22 10:28 AM  Result Value Ref Range   Prostate Specific Ag, Serum 5.2 (H) 0.0 - 4.0 ng/mL    Comment: Roche ECLIA methodology. According to the American Urological Association, Serum PSA should decrease  and remain at undetectable levels after radical prostatectomy. The AUA defines biochemical recurrence as an initial PSA value 0.2 ng/mL or greater followed by a subsequent confirmatory PSA value 0.2 ng/mL or greater. Values obtained with different assay methods or kits cannot be used interchangeably. Results cannot be interpreted as absolute evidence of the presence  or absence of malignant disease.    PSA, Free 0.34 N/A ng/mL    Comment: Roche ECLIA methodology.   PSA, Free Pct 6.5 %    Comment: The table below lists the probability of prostate cancer for men with non-suspicious DRE results and total PSA between 4 and 10 ng/mL, by patient age Ricci Barker, [place], [id number]).                   % Free PSA       50-64 yr        65-75 yr                   0.00-10.00%        56%             55%                  10.01-15.00%        24%             35%                  15.01-20.00%        17%             23%                  20.01-25.00%        10%             20%                       >25.00%         5%              9% Please note:  Catalona et al did not make specific               recommendations regarding the use of               percent free PSA for any other population               of men.   Urinalysis, Routine w reflex microscopic     Status: None   Collection Time: 06/07/22 10:25 AM  Result Value Ref Range   Specific Gravity, UA 1.015 1.005 - 1.030   pH, UA 7.0 5.0 - 7.5   Color, UA Yellow Yellow   Appearance Ur Clear Clear   Leukocytes,UA Negative Negative   Protein,UA Negative Negative/Trace   Glucose, UA Negative Negative   Ketones, UA Negative Negative   RBC, UA Negative Negative   Bilirubin, UA Negative Negative   Urobilinogen, Ur 0.2 0.2 - 1.0 mg/dL   Nitrite, UA Negative Negative   Microscopic Examination Comment     Comment: Microscopic not indicated and not performed.   Lab Results  Component Value Date   PSA1 5.2 (H) 06/01/2022   PSA1 5.7 (H)  11/21/2021   PSA1 5.4 (H) 05/25/2021     UA is clear.      Studies/Results: No results found.    Assessment/Plan: Elevated PSA.  His PSA remains mildly elevated but down from the prior high but does have a low f/t ratio.   He would like to stay with PSA f/u and will have a repeat with Dr. [place] Crane in 6 months and me in a year.   Anal stenosis.  He will need anesthesia if a biopsy is required.   BPH  with BOO.  He is doing well on tamsulosin.  He will continue that.    Meds ordered this encounter  Medications   tamsulosin (FLOMAX) 0.4 MG CAPS capsule    Sig: Take 1 capsule (0.4 mg total) by mouth daily.    Dispense:  100 capsule    Refill:  3      Orders Placed This Encounter  Procedures   Urinalysis, Routine w reflex microscopic   PSA, total and free    Standing Status:   Future    Standing Expiration Date:   06/08/2023     Return in about 1 year (around 06/08/2023) for with a PSA.Marland Kitchen    CC: Dr. Lucia Gaskins.      Irine Seal 06/08/2022 [phone] Patient ID: Dewain Penning, male   DOB: [birth year], 76 y.o.   MRN: [id number] Patient ID: KNOXX BOEDING, male   DOB: 1946/05/03, 76 y.o.   MRN: [id number]

## 2022-06-11 DIAGNOSIS — Z87891 Personal history of nicotine dependence: Secondary | ICD-10-CM | POA: Diagnosis not present

## 2022-06-11 DIAGNOSIS — Z933 Colostomy status: Secondary | ICD-10-CM | POA: Diagnosis not present

## 2022-06-11 DIAGNOSIS — Z79899 Other long term (current) drug therapy: Secondary | ICD-10-CM | POA: Diagnosis not present

## 2022-06-11 DIAGNOSIS — I10 Essential (primary) hypertension: Secondary | ICD-10-CM | POA: Diagnosis not present

## 2022-06-11 DIAGNOSIS — N3281 Overactive bladder: Secondary | ICD-10-CM | POA: Diagnosis not present

## 2022-06-11 DIAGNOSIS — R972 Elevated prostate specific antigen [PSA]: Secondary | ICD-10-CM | POA: Diagnosis not present

## 2022-06-11 DIAGNOSIS — M170 Bilateral primary osteoarthritis of knee: Secondary | ICD-10-CM | POA: Diagnosis not present

## 2022-06-11 DIAGNOSIS — R7301 Impaired fasting glucose: Secondary | ICD-10-CM | POA: Diagnosis not present

## 2022-06-11 DIAGNOSIS — Z713 Dietary counseling and surveillance: Secondary | ICD-10-CM | POA: Diagnosis not present

## 2022-06-11 DIAGNOSIS — Z85038 Personal history of other malignant neoplasm of large intestine: Secondary | ICD-10-CM | POA: Diagnosis not present

## 2022-06-11 DIAGNOSIS — Z96641 Presence of right artificial hip joint: Secondary | ICD-10-CM | POA: Diagnosis not present

## 2022-06-11 DIAGNOSIS — E782 Mixed hyperlipidemia: Secondary | ICD-10-CM | POA: Diagnosis not present

## 2022-08-20 DIAGNOSIS — M170 Bilateral primary osteoarthritis of knee: Secondary | ICD-10-CM | POA: Diagnosis not present

## 2022-08-23 DIAGNOSIS — Z85038 Personal history of other malignant neoplasm of large intestine: Secondary | ICD-10-CM | POA: Diagnosis not present

## 2022-08-23 DIAGNOSIS — Z933 Colostomy status: Secondary | ICD-10-CM | POA: Diagnosis not present

## 2022-09-18 DIAGNOSIS — D239 Other benign neoplasm of skin, unspecified: Secondary | ICD-10-CM | POA: Diagnosis not present

## 2022-09-18 DIAGNOSIS — D485 Neoplasm of uncertain behavior of skin: Secondary | ICD-10-CM | POA: Diagnosis not present

## 2022-09-18 DIAGNOSIS — Q828 Other specified congenital malformations of skin: Secondary | ICD-10-CM | POA: Diagnosis not present

## 2022-09-18 DIAGNOSIS — Z85820 Personal history of malignant melanoma of skin: Secondary | ICD-10-CM | POA: Diagnosis not present

## 2022-09-18 DIAGNOSIS — L570 Actinic keratosis: Secondary | ICD-10-CM | POA: Diagnosis not present

## 2022-09-18 DIAGNOSIS — D225 Melanocytic nevi of trunk: Secondary | ICD-10-CM | POA: Diagnosis not present

## 2022-11-07 DIAGNOSIS — M170 Bilateral primary osteoarthritis of knee: Secondary | ICD-10-CM | POA: Diagnosis not present

## 2022-11-21 DIAGNOSIS — Z933 Colostomy status: Secondary | ICD-10-CM | POA: Diagnosis not present

## 2022-11-21 DIAGNOSIS — Z85038 Personal history of other malignant neoplasm of large intestine: Secondary | ICD-10-CM | POA: Diagnosis not present

## 2022-12-05 DIAGNOSIS — R972 Elevated prostate specific antigen [PSA]: Secondary | ICD-10-CM | POA: Diagnosis not present

## 2022-12-05 DIAGNOSIS — E782 Mixed hyperlipidemia: Secondary | ICD-10-CM | POA: Diagnosis not present

## 2022-12-05 DIAGNOSIS — R7301 Impaired fasting glucose: Secondary | ICD-10-CM | POA: Diagnosis not present

## 2022-12-11 DIAGNOSIS — Z933 Colostomy status: Secondary | ICD-10-CM | POA: Diagnosis not present

## 2022-12-11 DIAGNOSIS — R600 Localized edema: Secondary | ICD-10-CM | POA: Diagnosis not present

## 2022-12-11 DIAGNOSIS — R972 Elevated prostate specific antigen [PSA]: Secondary | ICD-10-CM | POA: Diagnosis not present

## 2022-12-11 DIAGNOSIS — R7301 Impaired fasting glucose: Secondary | ICD-10-CM | POA: Diagnosis not present

## 2022-12-11 DIAGNOSIS — N3281 Overactive bladder: Secondary | ICD-10-CM | POA: Diagnosis not present

## 2022-12-11 DIAGNOSIS — E782 Mixed hyperlipidemia: Secondary | ICD-10-CM | POA: Diagnosis not present

## 2022-12-11 DIAGNOSIS — Z85038 Personal history of other malignant neoplasm of large intestine: Secondary | ICD-10-CM | POA: Diagnosis not present

## 2022-12-11 DIAGNOSIS — M170 Bilateral primary osteoarthritis of knee: Secondary | ICD-10-CM | POA: Diagnosis not present

## 2022-12-11 DIAGNOSIS — I10 Essential (primary) hypertension: Secondary | ICD-10-CM | POA: Diagnosis not present

## 2022-12-11 DIAGNOSIS — Z96641 Presence of right artificial hip joint: Secondary | ICD-10-CM | POA: Diagnosis not present

## 2023-02-04 DIAGNOSIS — M170 Bilateral primary osteoarthritis of knee: Secondary | ICD-10-CM | POA: Diagnosis not present

## 2023-03-12 DIAGNOSIS — H524 Presbyopia: Secondary | ICD-10-CM | POA: Diagnosis not present

## 2023-03-19 DIAGNOSIS — D485 Neoplasm of uncertain behavior of skin: Secondary | ICD-10-CM | POA: Diagnosis not present

## 2023-03-19 DIAGNOSIS — Z008 Encounter for other general examination: Secondary | ICD-10-CM | POA: Diagnosis not present

## 2023-03-19 DIAGNOSIS — Z85828 Personal history of other malignant neoplasm of skin: Secondary | ICD-10-CM | POA: Diagnosis not present

## 2023-03-19 DIAGNOSIS — L570 Actinic keratosis: Secondary | ICD-10-CM | POA: Diagnosis not present

## 2023-03-21 DIAGNOSIS — Z933 Colostomy status: Secondary | ICD-10-CM | POA: Diagnosis not present

## 2023-03-21 DIAGNOSIS — Z85038 Personal history of other malignant neoplasm of large intestine: Secondary | ICD-10-CM | POA: Diagnosis not present

## 2023-04-04 ENCOUNTER — Emergency Department (HOSPITAL_COMMUNITY): Payer: Medicare HMO

## 2023-04-04 ENCOUNTER — Emergency Department (HOSPITAL_COMMUNITY)
Admission: EM | Admit: 2023-04-04 | Discharge: 2023-04-04 | Disposition: A | Discharge status: Home / Self Care | Payer: Medicare HMO | Attending: Emergency Medicine | Admitting: Emergency Medicine

## 2023-04-04 ENCOUNTER — Other Ambulatory Visit: Payer: Self-pay

## 2023-04-04 ENCOUNTER — Encounter (HOSPITAL_COMMUNITY): Payer: Self-pay

## 2023-04-04 DIAGNOSIS — R109 Unspecified abdominal pain: Secondary | ICD-10-CM | POA: Diagnosis not present

## 2023-04-04 DIAGNOSIS — R112 Nausea with vomiting, unspecified: Secondary | ICD-10-CM | POA: Insufficient documentation

## 2023-04-04 DIAGNOSIS — R140 Abdominal distension (gaseous): Secondary | ICD-10-CM | POA: Diagnosis not present

## 2023-04-04 DIAGNOSIS — C44319 Basal cell carcinoma of skin of other parts of face: Secondary | ICD-10-CM | POA: Diagnosis not present

## 2023-04-04 DIAGNOSIS — K5730 Diverticulosis of large intestine without perforation or abscess without bleeding: Secondary | ICD-10-CM | POA: Diagnosis not present

## 2023-04-04 DIAGNOSIS — L905 Scar conditions and fibrosis of skin: Secondary | ICD-10-CM | POA: Diagnosis not present

## 2023-04-04 DIAGNOSIS — R059 Cough, unspecified: Secondary | ICD-10-CM | POA: Diagnosis not present

## 2023-04-04 DIAGNOSIS — R0989 Other specified symptoms and signs involving the circulatory and respiratory systems: Secondary | ICD-10-CM | POA: Diagnosis not present

## 2023-04-04 DIAGNOSIS — N200 Calculus of kidney: Secondary | ICD-10-CM | POA: Diagnosis not present

## 2023-04-04 DIAGNOSIS — K7689 Other specified diseases of liver: Secondary | ICD-10-CM | POA: Diagnosis not present

## 2023-04-04 DIAGNOSIS — R1084 Generalized abdominal pain: Secondary | ICD-10-CM | POA: Insufficient documentation

## 2023-04-04 LAB — COMPREHENSIVE METABOLIC PANEL
ALT: 35 U/L (ref 0–44)
AST: 25 U/L (ref 15–41)
Albumin: 3.9 g/dL (ref 3.5–5.0)
Alkaline Phosphatase: 62 U/L (ref 38–126)
Anion gap: 10 (ref 5–15)
BUN: 12 mg/dL (ref 8–23)
CO2: 26 mmol/L (ref 22–32)
Calcium: 9.1 mg/dL (ref 8.9–10.3)
Chloride: 102 mmol/L (ref 98–111)
Creatinine, Ser: 0.9 mg/dL (ref 0.61–1.24)
GFR, Estimated: >60 mL/min (ref >60)
Glucose, Bld: 168 mg/dL — ABNORMAL HIGH (ref 70–99)
Potassium: 3.5 mmol/L (ref 3.5–5.1)
Sodium: 138 mmol/L (ref 135–145)
Total Bilirubin: 1.3 mg/dL — ABNORMAL HIGH (ref 0.3–1.2)
Total Protein: 6.7 g/dL (ref 6.5–8.1)

## 2023-04-04 LAB — CBC WITH DIFFERENTIAL/PLATELET
Abs Immature Granulocytes: 0.07 10*3/uL (ref 0.00–0.07)
Basophils Absolute: 0 10*3/uL (ref 0.0–0.1)
Basophils Relative: 0 %
Eosinophils Absolute: 0 10*3/uL (ref 0.0–0.5)
Eosinophils Relative: 0 %
HCT: 42.7 % (ref 39.0–52.0)
Hemoglobin: 15.2 g/dL (ref 13.0–17.0)
Immature Granulocytes: 1 %
Lymphocytes Relative: 4 %
Lymphs Abs: 0.4 10*3/uL — ABNORMAL LOW (ref 0.7–4.0)
MCH: 31.8 pg (ref 26.0–34.0)
MCHC: 35.6 g/dL (ref 30.0–36.0)
MCV: 89.3 fL (ref 80.0–100.0)
Monocytes Absolute: 0.5 10*3/uL (ref 0.1–1.0)
Monocytes Relative: 4 %
Neutro Abs: 10.4 10*3/uL — ABNORMAL HIGH (ref 1.7–7.7)
Neutrophils Relative %: 91 %
Platelets: 187 10*3/uL (ref 150–400)
RBC: 4.78 MIL/uL (ref 4.22–5.81)
RDW: 13 % (ref 11.5–15.5)
WBC: 11.4 10*3/uL — ABNORMAL HIGH (ref 4.0–10.5)
nRBC: 0 % (ref 0.0–0.2)

## 2023-04-04 LAB — LACTIC ACID, PLASMA: Lactic Acid, Venous: 2.4 mmol/L (ref 0.5–1.9)

## 2023-04-04 LAB — LIPASE, BLOOD: Lipase: 28 U/L (ref 11–51)

## 2023-04-04 MED ORDER — MORPHINE SULFATE (PF) 4 MG/ML IV SOLN
4.0000 mg | Freq: Once | INTRAVENOUS | Status: AC
  Administered 2023-04-04: 4 mg via INTRAVENOUS
  Filled 2023-04-04: qty 1

## 2023-04-04 MED ORDER — ONDANSETRON 4 MG PO TBDP: ORAL_TABLET | ORAL | 0 refills | Status: DC

## 2023-04-04 MED ORDER — ONDANSETRON HCL 4 MG/2ML IJ SOLN
4.0000 mg | Freq: Once | INTRAMUSCULAR | Status: AC
  Administered 2023-04-04: 4 mg via INTRAVENOUS
  Filled 2023-04-04: qty 2

## 2023-04-04 MED ORDER — IOHEXOL 300 MG/ML  SOLN
100.0000 mL | Freq: Once | INTRAMUSCULAR | Status: AC | PRN
  Administered 2023-04-04: 100 mL via INTRAVENOUS

## 2023-04-04 NOTE — ED Notes (Signed)
ED Provider at bedside. 

## 2023-04-04 NOTE — ED Triage Notes (Signed)
Pt c/o abdominal pain that started 0230 this morning. Pt states he ate a lot of peanuts and chocolate covered peanuts yesterday. Pt endorses emesis. Pt said that he has been told that he has the "start of diverticulitis".

## 2023-04-04 NOTE — ED Provider Notes (Signed)
[place] EMERGENCY DEPARTMENT AT [facility] Provider Note   CSN: [id number] Arrival date & time: 04/04/23  9528     History  No chief complaint on file.   Walter Orozco is a 77 y.o. male.  Presents to the emergency department for evaluation of abdominal pain.  Patient reports that he woke at 230 with diffuse abdominal pain, mild abdominal distention, nausea and vomiting.  He has vomited multiple times, yellow vomitus.  No blood.  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  He has been putting stool out.  No blood.       Home Medications Prior to Admission medications   Medication Sig Start Date End Date Taking? Authorizing Provider  ondansetron (ZOFRAN-ODT) 4 MG disintegrating tablet 4mg  ODT q4 hours prn nausea/vomit 04/04/23  Yes Brodie Scovell, Canary Brim, MD  amLODipine (NORVASC) 10 MG tablet Take 10 mg by mouth daily.    [provider]  Cholecalciferol (VITAMIN D3) 125 MCG (5000 UT) CAPS Take 5,000 Units by mouth daily.    [provider]  lisinopril (PRINIVIL,ZESTRIL) 10 MG tablet Take 10 mg by mouth daily.    [provider]  simvastatin (ZOCOR) 40 MG tablet Take 40 mg by mouth daily.    [provider]  tamsulosin (FLOMAX) 0.4 MG CAPS capsule Take 1 capsule (0.4 mg total) by mouth daily. 06/07/22   Bjorn Pippin, MD      Allergies    Patient has no known allergies.    Review of Systems   Review of Systems  Physical Exam Updated Vital Signs BP 122/81   Pulse 78   Temp (!) 97.5 F (36.4 C) (Oral)   Resp 18   SpO2 100%  Physical Exam Vitals and nursing note reviewed.  Constitutional:      General: He is not in acute distress.    Appearance: He is well-developed.  HENT:     Head: Normocephalic and atraumatic.     Mouth/Throat:     Mouth: Mucous membranes are moist.  Eyes:     General: Vision grossly intact. Gaze aligned appropriately.     Extraocular Movements: Extraocular movements  intact.     Conjunctiva/sclera: Conjunctivae normal.  Cardiovascular:     Rate and Rhythm: Normal rate and regular rhythm.     Pulses: Normal pulses.     Heart sounds: Normal heart sounds, S1 normal and S2 normal. No murmur heard.    No friction rub. No gallop.  Pulmonary:     Effort: Pulmonary effort is normal. No respiratory distress.     Breath sounds: Normal breath sounds.  Abdominal:     General: Bowel sounds are decreased. There is distension.     Palpations: Abdomen is soft.     Tenderness: There is generalized abdominal tenderness. There is no guarding or rebound.     Hernia: No hernia is present.  Musculoskeletal:        General: No swelling.     Cervical back: Full passive range of motion without pain, normal range of motion and neck supple. No pain with movement, spinous process tenderness or muscular tenderness. Normal range of motion.     Right lower leg: No edema.     Left lower leg: No edema.  Skin:    General: Skin is warm and dry.     Capillary Refill: Capillary refill takes less than 2 seconds.     Findings: No ecchymosis, erythema, lesion or wound.  Neurological:  Mental Status: He is alert and oriented to person, place, and time.     GCS: GCS eye subscore is 4. GCS verbal subscore is 5. GCS motor subscore is 6.     Cranial Nerves: Cranial nerves 2-12 are intact.     Sensory: Sensation is intact.     Motor: Motor function is intact. No weakness or abnormal muscle tone.     Coordination: Coordination is intact.  Psychiatric:        Mood and Affect: Mood normal.        Speech: Speech normal.        Behavior: Behavior normal.     ED Results / Procedures / Treatments   Labs (all labs ordered are listed, but only abnormal results are displayed) Labs Reviewed  CBC WITH DIFFERENTIAL/PLATELET - Abnormal; Notable for the following components:      Result Value   WBC 11.4 (*)    Neutro Abs 10.4 (*)    Lymphs Abs 0.4 (*)    All other components within normal  limits  COMPREHENSIVE METABOLIC PANEL - Abnormal; Notable for the following components:   Glucose, Bld 168 (*)    Total Bilirubin 1.3 (*)    All other components within normal limits  LACTIC ACID, PLASMA - Abnormal; Notable for the following components:   Lactic Acid, Venous 2.4 (*)    All other components within normal limits  LIPASE, BLOOD    EKG None  Radiology CT ABDOMEN PELVIS W CONTRAST  Result Date: 04/04/2023 CLINICAL DATA:  77 year old male with abdominal pain onset 0230 hours. EXAM: CT ABDOMEN AND PELVIS WITH CONTRAST TECHNIQUE: Multidetector CT imaging of the abdomen and pelvis was performed using the standard protocol following bolus administration of intravenous contrast. RADIATION DOSE REDUCTION: This exam was performed according to the departmental dose-optimization program which includes automated exposure control, adjustment of the mA and/or kV according to patient size and/or use of iterative reconstruction technique. CONTRAST:  OMNIPAQUE IOHEXOL 300 MG/ML  SOLN COMPARISON:  Noncontrast CT Abdomen and Pelvis 08/01/2005. FINDINGS: Lower chest: Calcified coronary artery atherosclerosis. No cardiomegaly or pericardial effusion. Negative lung bases, no pleural effusion. Hepatobiliary: Negative; subcentimeter but circumscribed low-density areas in the liver appear to be chronic and are most likely benign cysts (no follow-up imaging recommended). 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 no significant pancreatic atrophy since that time. Spleen: Negative. Adrenals/Urinary Tract: Normal adrenal glands. Nonobstructed kidneys with right side nephrolithiasis. Simple fluid density right renal midpole cyst (no follow-up imaging recommended). Symmetric renal contrast excretion to the ureters which are nondilated. Bladder  partially obscured by right hip streak artifact. Pelvic phleboliths. Stomach/Bowel: Chronic descending colostomy, decompressed blind-ending rectum. 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 Upstream diverticulosis and retained stool. Fluid in the transverse colon also with diverticulosis throughout that segment. Fluid in the right colon with diverticulosis. But no active inflammation of those segments is identified. Evidence of a diminutive, normal appendix on coronal image 52 although small volume adjacent mesenteric free fluid there. Simple fluid density. Decompressed distal terminal ileum, upstream small bowel containing some fluid. And gradually more dilated fluid containing small bowel loops in the upstream ileum. Chronic postoperative changes to the ventral abdominal wall throughout the area, and some loops are closely approximated to the wall.  However, the mildly dilated and fluid-filled small bowel continues proximally nearly to the ligament of Treitz and there is no discrete transition point. Trace free fluid in the right lower quadrant mesentery but no free air. Fluid containing stomach also. Duodenum and ligament of Treitz level bowel is decompressed. Vascular/Lymphatic: Moderate to severe Aortoiliac calcified atherosclerosis. 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 Portal venous system is patent. No lymphadenopathy. Reproductive: Negative. Other: No pelvis free fluid. Musculoskeletal: Widespread degeneration in the spine. Benign appearing L3 vertebral body hemangioma. Right hip arthroplasty. No acute or suspicious osseous lesion identified. IMPRESSION: 1. Generalized fluid-filled small bowel loops throughout the abdomen and pelvis without transition point, and similar fluid-filled appearance of the proximal  and mid large bowel. Trace free fluid but no free air. Favor Enteritis/Diarrhea over small-bowel obstruction. And extensive large bowel diverticulosis, but no active diverticulitis identified. Chronic descending colostomy. 2. Otherwise stable CT appearance of the abdomen since 2007. Nephrolithiasis without obstructive uropathy. 3. Advanced aortoiliac, Aortic Atherosclerosis (ICD10-I70.0). Electronically Signed   By: Odessa Fleming M.D.   On: 04/04/2023 07:02   DG Chest Port 1 View  Result Date: 04/04/2023 CLINICAL DATA:  77 year old male with cough. Abdominal pain onset 0230 hours. Vomiting. Remote history of colon cancer. EXAM: PORTABLE CHEST 1 VIEW COMPARISON:  Chest CT 06/29/2010 and earlier. FINDINGS: Portable AP semi upright view at 0546 hours. Mildly lower lung volumes compared to prior exams. Mediastinal contours remain within normal limits. Visualized tracheal air column is within normal limits. Allowing for portable technique the lungs are clear. No pneumothorax or pleural effusion. Paucity of bowel gas in the visible abdomen. No acute osseous abnormality identified. IMPRESSION: No acute cardiopulmonary abnormality. Electronically Signed   By: Odessa Fleming M.D.   On: 04/04/2023 05:59    Procedures Procedures    Medications Ordered in ED Medications  morphine (PF) 4 MG/ML injection 4 mg (4 mg Intravenous Given 04/04/23 0544)  ondansetron (ZOFRAN) injection 4 mg (4 mg Intravenous Given 04/04/23 0544)  iohexol (OMNIPAQUE) 300 MG/ML solution 100 mL (100 mLs Intravenous Contrast Given 04/04/23 0615)    ED Course/ Medical Decision Making/ A&P                                 Medical Decision Making Amount and/or Complexity of Data Reviewed Labs: ordered. Radiology: ordered.  Risk Prescription drug management.   Differential Diagnosis considered includes, but not limited to: Cholelithiasis; cholecystitis; cholangitis; bowel obstruction; esophagitis; gastritis; peptic ulcer disease; pancreatitis;  cardiac.   Patient with history of colostomy secondary to colon cancer presents to the emergency department with abdominal distention, vomiting, diffuse abdominal pain.  No focal tenderness on exam, no peritonitis.  Patient appears well.  Lab work with slight leukocytosis, slight lactic acidosis, otherwise normal.  CT abdomen and pelvis performed.  Patient with mild enteritis signs but no evidence of obstruction, no evidence of diverticulitis or other significant abnormality.  Upon recheck he is feeling better.  Abdominal distention has gone down, he is nontender.  Discussed with patient, will discharge with some antiemetics, if his symptoms worsen he should come back to the ED.  Liquid diet for this morning and slowly advance.        Final Clinical Impression(s) / ED Diagnoses Final diagnoses:  Generalized abdominal pain  Nausea and vomiting, unspecified vomiting type    Rx / DC  Orders ED Discharge Orders          Ordered    ondansetron (ZOFRAN-ODT) 4 MG disintegrating tablet        04/04/23 0710              Gilda Crease, MD 04/04/23 0710

## 2023-04-09 DIAGNOSIS — M170 Bilateral primary osteoarthritis of knee: Secondary | ICD-10-CM | POA: Diagnosis not present

## 2023-04-09 DIAGNOSIS — R109 Unspecified abdominal pain: Secondary | ICD-10-CM | POA: Diagnosis not present

## 2023-04-09 DIAGNOSIS — I10 Essential (primary) hypertension: Secondary | ICD-10-CM | POA: Diagnosis not present

## 2023-04-09 DIAGNOSIS — I700 Atherosclerosis of aorta: Secondary | ICD-10-CM | POA: Diagnosis not present

## 2023-04-10 DIAGNOSIS — Z23 Encounter for immunization: Secondary | ICD-10-CM | POA: Diagnosis not present

## 2023-05-09 DIAGNOSIS — M170 Bilateral primary osteoarthritis of knee: Secondary | ICD-10-CM | POA: Diagnosis not present

## 2023-05-16 DIAGNOSIS — M25561 Pain in right knee: Secondary | ICD-10-CM | POA: Diagnosis not present

## 2023-05-30 ENCOUNTER — Other Ambulatory Visit: Payer: Medicare HMO

## 2023-05-30 DIAGNOSIS — R972 Elevated prostate specific antigen [PSA]: Secondary | ICD-10-CM | POA: Diagnosis not present

## 2023-05-31 LAB — PSA, TOTAL AND FREE
PSA, Free Pct: 8.8 %
PSA, Free: 0.64 ng/mL
Prostate Specific Ag, Serum: 7.3 ng/mL — ABNORMAL HIGH (ref 0.0–4.0)

## 2023-06-06 ENCOUNTER — Ambulatory Visit: Payer: Medicare HMO | Admitting: Urology

## 2023-06-06 ENCOUNTER — Encounter: Payer: Self-pay | Admitting: Urology

## 2023-06-06 VITALS — BP 120/73 | HR 80

## 2023-06-06 DIAGNOSIS — N138 Other obstructive and reflux uropathy: Secondary | ICD-10-CM

## 2023-06-06 DIAGNOSIS — N401 Enlarged prostate with lower urinary tract symptoms: Secondary | ICD-10-CM | POA: Diagnosis not present

## 2023-06-06 DIAGNOSIS — Z87442 Personal history of urinary calculi: Secondary | ICD-10-CM

## 2023-06-06 DIAGNOSIS — R972 Elevated prostate specific antigen [PSA]: Secondary | ICD-10-CM

## 2023-06-06 DIAGNOSIS — R351 Nocturia: Secondary | ICD-10-CM | POA: Diagnosis not present

## 2023-06-06 LAB — URINALYSIS, ROUTINE W REFLEX MICROSCOPIC
Bilirubin, UA: NEGATIVE
Glucose, UA: NEGATIVE
Ketones, UA: NEGATIVE
Leukocytes,UA: NEGATIVE
Nitrite, UA: NEGATIVE
Protein,UA: NEGATIVE
RBC, UA: NEGATIVE
Specific Gravity, UA: 1.02 (ref 1.005–1.030)
Urobilinogen, Ur: 0.2 mg/dL (ref 0.2–1.0)
pH, UA: 6 (ref 5.0–7.5)

## 2023-06-06 MED ORDER — TAMSULOSIN HCL 0.4 MG PO CAPS: 0.4000 mg | ORAL_CAPSULE | Freq: Every day | ORAL | 3 refills | Status: AC

## 2023-06-06 NOTE — Progress Notes (Signed)
Subjective: 1. Elevated PSA   2. Enlarged prostate with urinary obstruction   3. Nocturia   4. History of nephrolithiasis    06/06/23: Achintya returns today in f/u.  His PSA is back up to 7.3 with an 8.8% f/t ratio.  He had an acute right knee injury with inflammation prior ot the blood draw.  The PSA is in his usual range and his prior high has been up to 9.  He is voiding well on tamsulosin with an IPSS of 5 and nocturia x 2. His UA is clear.  He had a CT AP on 04/04/23 and had a right renal cyst.  The prostate had no nodules or other GU findings. His labs then were unremarkable.    06/07/22: Akashdeep returns today in f/u.  His PSA is down slightly to 5.2 with a 6.5% f/t ratio. He remains on tamsulosin.  His IPSS is 6 with nocturia x 2.  He has been released from follow up for his colon cancer.  His UA is clear.    11/30/21: Kaleil returns today in f/u.  His PSA was stable at 5.7 with an 8.9% f/t ratio on 11/11/21 and 5.4 with a 9.1% f/t ratio on 05/25/21.  It was 6.1 in 8/22 and 9.0 in 6/22.   He remains on tamsulosin.  His IPSS is 3 with nocturia x 1.   05/25/21.  Mr. Nowling returns in f/u.  He didn't get the planned PSA prior to this visit.  He continues to void well on tamsulosin.  His IPSS is 5.    02/16/21: Mr. Prudencio returns today in f/u.  His PSA on 12/22/20 was 9.0 with a 6.8% f/t ratio.  He had a prostate MRI on 01/11/21 that  showed a 31ml prostate with  PIRADS 1 categorization.  A repeat PSA prior to this visit is down to 6.1 with an 11.0% f/t ratio. His IPSS is 3.  He remains on tamulosin.    12/22/20: Mr. Borghese returns for overdue f/u.  He was last seen in 11/19 for a history of an elevated PSA and his PSA then was 4.4.  His PSA had been above 5 in the past.   Since then his PSA has risen slowly and was 4.5 in 8/20, 5.0 in 7/21 and 5.21 on 07/21/20 which was stable.  His UA is unremarkable. He also has a history of colon cancer with bladder invasion that required resection followed by chemo and RTx.   He remains in remission with a low CEA.    He has a colostomy.  He has a history of stones.  He is voiding well and remains on tamsulosin.  He just got the scripts refilled.      ROS:  Review of Systems  All other systems reviewed and are negative.   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   Arthritis    High cholesterol    Hypertension     Past Surgical History:  Procedure Laterality Date   CATARACT EXTRACTION W/PHACO Left 12/23/2019   Procedure: CATARACT EXTRACTION PHACO AND INTRAOCULAR LENS PLACEMENT LEFT EYE;  Surgeon: Fabio Pierce, MD;  Location: [facility];  Service: Ophthalmology;  Laterality: Left;  CDE: 14.68   CATARACT EXTRACTION W/PHACO Right 01/08/2020   Procedure: CATARACT EXTRACTION PHACO AND INTRAOCULAR LENS PLACEMENT RIGHT EYE;  Surgeon: Fabio Pierce, MD;  Location: [facility];  Service: Ophthalmology;  Laterality: Right;  CDE: 18.60   COLON  SURGERY     COLONOSCOPY  07/10/2012   Procedure: COLONOSCOPY;  Surgeon: Malissa Hippo, MD;  Location: [facility];  Service: Endoscopy;  Laterality: N/A;  1200   COLONOSCOPY N/A 02/29/2016   Procedure: COLONOSCOPY;  Surgeon: Malissa Hippo, MD;  Location: [facility];  Service: Endoscopy;  Laterality: N/A;  1200 - moved to 9/6 @ 10:30   COLONOSCOPY WITH PROPOFOL N/A 03/29/2021   Procedure: COLONOSCOPY WITH PROPOFOL;  Surgeon: Malissa Hippo, MD;  Location: [facility];  Service: Endoscopy;  Laterality: N/A;  12:30   TOTAL HIP ARTHROPLASTY Right 06/02/2019   Procedure: TOTAL HIP ARTHROPLASTY ANTERIOR APPROACH;  Surgeon: Durene Romans, MD;  Location: [facility];  Service: Orthopedics;  Laterality: Right;  70 mins    Social History   Socioeconomic History   Marital status: Married    Spouse name: Not on file   Number of children: Not on file   Years of education: Not on file   Highest education level: Not on file  Occupational History   Not on file   Tobacco Use   Smoking status: Former    Current packs/day: 0.00    Types: Cigarettes    Quit date: 01/22/1973    Years since quitting: 50.4   Smokeless tobacco: Former    Types: Chew, Snuff  Vaping Use   Vaping status: Never Used  Substance and Sexual Activity   Alcohol use: Yes    Comment: occasional glass of wine or beer   Drug use: No   Sexual activity: Not on file  Other Topics Concern   Not on file  Social History Narrative   Not on file   Social Drivers of Health   Financial Resource Strain: Not on file  Food Insecurity: Not on file  Transportation Needs: Not on file  Physical Activity: Not on file  Stress: Not on file  Social Connections: Not on file  Intimate Partner Violence: Not on file    Family History  Problem Relation Age of Onset   Cancer Father     Anti-infectives: Anti-infectives (From admission, onward)    None       Current Outpatient Medications  Medication Sig Dispense Refill   amLODipine (NORVASC) 10 MG tablet Take 10 mg by mouth daily.     Cholecalciferol (VITAMIN D3) 125 MCG (5000 UT) CAPS Take 5,000 Units by mouth daily.     lisinopril (PRINIVIL,ZESTRIL) 10 MG tablet Take 10 mg by mouth daily.     ondansetron (ZOFRAN-ODT) 4 MG disintegrating tablet 4mg  ODT q4 hours prn nausea/vomit 20 tablet 0   simvastatin (ZOCOR) 40 MG tablet Take 40 mg by mouth daily.     tamsulosin (FLOMAX) 0.4 MG CAPS capsule Take 1 capsule (0.4 mg total) by mouth daily. 100 capsule 3   No current facility-administered medications for this visit.   Facility-Administered Medications Ordered in Other Visits  Medication Dose Route Frequency Provider Last Rate Last Admin   neomycin-polymyxin b-dexamethasone (MAXITROL) ophthalmic suspension    PRN Fabio Pierce, MD   1 drop at 12/23/19 1332     Objective: Vital signs in last 24 hours: BP 120/73   Pulse 80   Intake/Output from previous day: No intake/output data recorded. Intake/Output this  shift: @IOTHISSHIFT @   Physical Exam Vitals reviewed.  Constitutional:      Appearance: Normal appearance.  Neurological:     Mental Status: He is alert.     Lab Results:  UA is clear.   Recent Results (  from the past 2160 hours)  CBC with Differential/Platelet     Status: Abnormal   Collection Time: 04/04/23  5:29 AM  Result Value Ref Range   WBC 11.4 (H) 4.0 - 10.5 K/uL   RBC 4.78 4.22 - 5.81 MIL/uL   Hemoglobin 15.2 13.0 - 17.0 g/dL   HCT 66.4 40.3 - 47.4 %   MCV 89.3 80.0 - 100.0 fL   MCH 31.8 26.0 - 34.0 pg   MCHC 35.6 30.0 - 36.0 g/dL   RDW 25.9 56.3 - 87.5 %   Platelets 187 150 - 400 K/uL   nRBC 0.0 0.0 - 0.2 %   Neutrophils Relative % 91 %   Neutro Abs 10.4 (H) 1.7 - 7.7 K/uL   Lymphocytes Relative 4 %   Lymphs Abs 0.4 (L) 0.7 - 4.0 K/uL   Monocytes Relative 4 %   Monocytes Absolute 0.5 0.1 - 1.0 K/uL   Eosinophils Relative 0 %   Eosinophils Absolute 0.0 0.0 - 0.5 K/uL   Basophils Relative 0 %   Basophils Absolute 0.0 0.0 - 0.1 K/uL   Immature Granulocytes 1 %   Abs Immature Granulocytes 0.07 0.00 - 0.07 K/uL    Comment: Performed at [facility], [address] [place], [place] [address]  Comprehensive metabolic panel     Status: Abnormal   Collection Time: 04/04/23  5:29 AM  Result Value Ref Range   Sodium 138 135 - 145 mmol/L   Potassium 3.5 3.5 - 5.1 mmol/L   Chloride 102 98 - 111 mmol/L   CO2 26 22 - 32 mmol/L   Glucose, Bld 168 (H) 70 - 99 mg/dL    Comment: Glucose reference range applies only to samples taken after fasting for at least 8 hours.   BUN 12 8 - 23 mg/dL   Creatinine, Ser 9.51 0.61 - 1.24 mg/dL   Calcium 9.1 8.9 - 88.4 mg/dL   Total Protein 6.7 6.5 - 8.1 g/dL   Albumin 3.9 3.5 - 5.0 g/dL   AST 25 15 - 41 U/L   ALT 35 0 - 44 U/L   Alkaline Phosphatase 62 38 - 126 U/L   Total Bilirubin 1.3 (H) 0.3 - 1.2 mg/dL   GFR, Estimated >16 >60 mL/min    Comment: (NOTE) Calculated using the CKD-EPI Creatinine Equation (2021)    Anion gap  10 5 - 15    Comment: Performed at [facility], [address], [place], [place] [address]  Lipase, blood     Status: None   Collection Time: 04/04/23  5:29 AM  Result Value Ref Range   Lipase 28 11 - 51 U/L    Comment: Performed at [facility], [address], [place], [place] [address]  Lactic acid, plasma     Status: Abnormal   Collection Time: 04/04/23  5:29 AM  Result Value Ref Range   Lactic Acid, Venous 2.4 (HH) 0.5 - 1.9 mmol/L    Comment: CRITICAL RESULT CALLED TO, READ BACK BY AND VERIFIED WITH EANES,C AT 6:15AM ON 04/04/23 BY [facility] Performed at [facility], [address], [place], [place] [address]   PSA, total and free     Status: Abnormal   Collection Time: 05/30/23 10:29 AM  Result Value Ref Range   Prostate Specific Ag, Serum 7.3 (H) 0.0 - 4.0 ng/mL    Comment: Roche ECLIA methodology. According to the American Urological Association, Serum PSA should decrease and remain at undetectable levels after radical prostatectomy. The AUA defines biochemical recurrence  as an initial PSA value 0.2 ng/mL or greater followed by a subsequent confirmatory PSA value 0.2 ng/mL or greater. Values obtained with different assay methods or kits cannot be used interchangeably. Results cannot be interpreted as absolute evidence of the presence or absence of malignant disease.    PSA, Free 0.64 N/A ng/mL    Comment: Roche ECLIA methodology.   PSA, Free Pct 8.8 %    Comment: The table below lists the probability of prostate cancer for men with non-suspicious DRE results and total PSA between 4 and 10 ng/mL, by patient age Damaris Schooner, JAMA 1998, [id number]).                   % Free PSA       50-64 yr        65-75 yr                   0.00-10.00%        56%             55%                  10.01-15.00%        24%             35%                  15.01-20.00%        17%             23%                  20.01-25.00%        10%             20%                       >25.00%          5%              9% Please note:  Catalona et al did not make specific               recommendations regarding the use of               percent free PSA for any other population               of men.   Urinalysis, Routine w reflex microscopic     Status: None   Collection Time: 06/06/23 11:23 AM  Result Value Ref Range   Specific Gravity, UA 1.020 1.005 - 1.030   pH, UA 6.0 5.0 - 7.5   Color, UA Yellow Yellow   Appearance Ur Clear Clear   Leukocytes,UA Negative Negative   Protein,UA Negative Negative/Trace   Glucose, UA Negative Negative   Ketones, UA Negative Negative   RBC, UA Negative Negative   Bilirubin, UA Negative Negative   Urobilinogen, Ur 0.2 0.2 - 1.0 mg/dL   Nitrite, UA Negative Negative   Microscopic Examination Comment     Comment: Microscopic not indicated and not performed.   Lab Results  Component Value Date   PSA1 7.3 (H) 05/30/2023   PSA1 5.2 (H) 06/01/2022   PSA1 5.7 (H) 11/21/2021     UA is clear.      Studies/Results: CT ABDOMEN PELVIS W CONTRAST Result Date: 04/04/2023 CLINICAL DATA:  77 year old male with abdominal pain onset 0230 hours. EXAM: CT ABDOMEN AND PELVIS WITH CONTRAST TECHNIQUE: Multidetector CT  imaging of the abdomen and pelvis was performed using the standard protocol following bolus administration of intravenous contrast. RADIATION DOSE REDUCTION: This exam was performed according to the departmental dose-optimization program which includes automated exposure control, adjustment of the mA and/or kV according to patient size and/or use of iterative reconstruction technique. CONTRAST:  OMNIPAQUE IOHEXOL 300 MG/ML  SOLN COMPARISON:  Noncontrast CT Abdomen and Pelvis 08/01/2005. FINDINGS: Lower chest: Calcified coronary artery atherosclerosis. No cardiomegaly or pericardial effusion. Negative lung bases, no pleural effusion. Hepatobiliary: Negative; subcentimeter but circumscribed low-density areas in the liver appear to be chronic and are  most likely benign cysts (no follow-up imaging recommended). 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 no significant pancreatic atrophy since that time. Spleen: Negative. Adrenals/Urinary Tract: Normal adrenal glands. Nonobstructed kidneys with right side nephrolithiasis. Simple fluid density right renal midpole cyst (no follow-up imaging recommended). Symmetric renal contrast excretion to the ureters which are nondilated. Bladder partially obscured by right hip streak artifact. Pelvic phleboliths. Stomach/Bowel: Chronic descending colostomy, decompressed blind-ending rectum. 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 Upstream diverticulosis and retained stool. Fluid in the transverse colon also with diverticulosis throughout that segment. Fluid in the right colon with diverticulosis. But no active inflammation of those segments is identified. Evidence of a diminutive, normal appendix on coronal image 52 although small volume adjacent mesenteric free fluid there. Simple fluid density. Decompressed distal terminal ileum, upstream small bowel containing some fluid. And gradually more dilated fluid containing small bowel loops in the upstream ileum. Chronic postoperative changes to the ventral abdominal wall throughout the area, and some loops are closely approximated to the wall. However, the mildly dilated and fluid-filled small bowel continues proximally nearly to the ligament of Treitz and there is no discrete transition point. Trace free fluid in the right lower quadrant mesentery but no free air. Fluid containing stomach also. Duodenum and ligament of Treitz level bowel is decompressed. Vascular/Lymphatic: Moderate to severe Aortoiliac calcified atherosclerosis.  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 Portal venous system is patent. No lymphadenopathy. Reproductive: Negative. Other: No pelvis free fluid. Musculoskeletal: Widespread degeneration in the spine. Benign appearing L3 vertebral body hemangioma. Right hip arthroplasty. No acute or suspicious osseous lesion identified. IMPRESSION: 1. Generalized fluid-filled small bowel loops throughout the abdomen and pelvis without transition point, and similar fluid-filled appearance of the proximal and mid large bowel. Trace free fluid but no free air. Favor Enteritis/Diarrhea over small-bowel obstruction. And extensive large bowel diverticulosis, but no active diverticulitis identified. Chronic descending colostomy. 2. Otherwise stable CT appearance of the abdomen since 2007. Nephrolithiasis without obstructive uropathy. 3. Advanced aortoiliac, Aortic Atherosclerosis (ICD10-I70.0). Electronically Signed   By: Odessa Fleming M.D.   On: 04/04/2023 07:02   DG Chest Port 1 View Result Date: 04/04/2023 CLINICAL DATA:  76 year old male with cough. Abdominal pain onset 0230 hours. Vomiting. Remote history of colon cancer. EXAM: PORTABLE CHEST 1 VIEW COMPARISON:  Chest CT 06/29/2010 and earlier. FINDINGS: Portable AP semi upright view at 0546 hours. Mildly lower lung volumes compared to prior exams. Mediastinal contours remain within normal limits. Visualized tracheal air column is within normal limits. Allowing for portable technique the lungs are clear. No pneumothorax or  pleural effusion. Paucity of bowel gas in the visible abdomen. No acute osseous abnormality identified. IMPRESSION: No acute cardiopulmonary abnormality. Electronically Signed   By: Odessa Fleming M.D.   On: 04/04/2023 05:59      Assessment/Plan: Elevated PSA.  His PSA is up again but below the high.  I will repeat it in 6 months and then a year.  If the PSA  continues to rise, I will get him set up for a biopsy under anesthesia.  Otherwise he will return in a year.   Anal stenosis.  He will need anesthesia if a biopsy is required.   BPH with BOO.  He is doing well on tamsulosin.  He will continue that.    Meds ordered this encounter  Medications   tamsulosin (FLOMAX) 0.4 MG CAPS capsule    Sig: Take 1 capsule (0.4 mg total) by mouth daily.    Dispense:  100 capsule    Refill:  3      Orders Placed This Encounter  Procedures   Urinalysis, Routine w reflex microscopic   PSA, total and free    Standing Status:   Future    Expected Date:   12/05/2023    Expiration Date:   06/05/2024   PSA, total and free    Standing Status:   Future    Expected Date:   05/29/2024    Expiration Date:   06/05/2024     Return in about 1 year (around 06/05/2024) for with PSA at 6 months and a year. .    CC: Dr. Oval Linsey.      Bjorn Pippin 06/07/2023 [phone] Patient ID: Eulogio Ditch, male   DOB: [birth year], 77 y.o.   MRN: [id number] Patient ID: IRVEN BROCKMANN, male   DOB: 07-31-1945, 77 y.o.   MRN: [id number]

## 2023-06-20 DIAGNOSIS — Z933 Colostomy status: Secondary | ICD-10-CM | POA: Diagnosis not present

## 2023-06-20 DIAGNOSIS — Z85038 Personal history of other malignant neoplasm of large intestine: Secondary | ICD-10-CM | POA: Diagnosis not present

## 2023-06-28 DIAGNOSIS — R972 Elevated prostate specific antigen [PSA]: Secondary | ICD-10-CM | POA: Diagnosis not present

## 2023-06-28 DIAGNOSIS — E782 Mixed hyperlipidemia: Secondary | ICD-10-CM | POA: Diagnosis not present

## 2023-06-28 DIAGNOSIS — Z85038 Personal history of other malignant neoplasm of large intestine: Secondary | ICD-10-CM | POA: Diagnosis not present

## 2023-06-28 DIAGNOSIS — R7301 Impaired fasting glucose: Secondary | ICD-10-CM | POA: Diagnosis not present

## 2023-07-04 DIAGNOSIS — R7301 Impaired fasting glucose: Secondary | ICD-10-CM | POA: Diagnosis not present

## 2023-07-04 DIAGNOSIS — Z933 Colostomy status: Secondary | ICD-10-CM | POA: Diagnosis not present

## 2023-07-04 DIAGNOSIS — Z85038 Personal history of other malignant neoplasm of large intestine: Secondary | ICD-10-CM | POA: Diagnosis not present

## 2023-07-04 DIAGNOSIS — E782 Mixed hyperlipidemia: Secondary | ICD-10-CM | POA: Diagnosis not present

## 2023-07-04 DIAGNOSIS — N3281 Overactive bladder: Secondary | ICD-10-CM | POA: Diagnosis not present

## 2023-07-04 DIAGNOSIS — R972 Elevated prostate specific antigen [PSA]: Secondary | ICD-10-CM | POA: Diagnosis not present

## 2023-07-04 DIAGNOSIS — Z96641 Presence of right artificial hip joint: Secondary | ICD-10-CM | POA: Diagnosis not present

## 2023-07-04 DIAGNOSIS — R600 Localized edema: Secondary | ICD-10-CM | POA: Diagnosis not present

## 2023-07-04 DIAGNOSIS — Z0000 Encounter for general adult medical examination without abnormal findings: Secondary | ICD-10-CM | POA: Diagnosis not present

## 2023-07-04 DIAGNOSIS — M170 Bilateral primary osteoarthritis of knee: Secondary | ICD-10-CM | POA: Diagnosis not present

## 2023-07-04 DIAGNOSIS — I10 Essential (primary) hypertension: Secondary | ICD-10-CM | POA: Diagnosis not present

## 2023-07-04 DIAGNOSIS — I700 Atherosclerosis of aorta: Secondary | ICD-10-CM | POA: Diagnosis not present

## 2023-08-08 DIAGNOSIS — M170 Bilateral primary osteoarthritis of knee: Secondary | ICD-10-CM | POA: Diagnosis not present

## 2023-09-18 DIAGNOSIS — Z933 Colostomy status: Secondary | ICD-10-CM | POA: Diagnosis not present

## 2023-09-18 DIAGNOSIS — Z85038 Personal history of other malignant neoplasm of large intestine: Secondary | ICD-10-CM | POA: Diagnosis not present

## 2023-09-19 DIAGNOSIS — M170 Bilateral primary osteoarthritis of knee: Secondary | ICD-10-CM | POA: Diagnosis not present

## 2023-09-24 DIAGNOSIS — D485 Neoplasm of uncertain behavior of skin: Secondary | ICD-10-CM | POA: Diagnosis not present

## 2023-09-24 DIAGNOSIS — Z85820 Personal history of malignant melanoma of skin: Secondary | ICD-10-CM | POA: Diagnosis not present

## 2023-09-24 DIAGNOSIS — L570 Actinic keratosis: Secondary | ICD-10-CM | POA: Diagnosis not present

## 2023-09-24 DIAGNOSIS — Z85828 Personal history of other malignant neoplasm of skin: Secondary | ICD-10-CM | POA: Diagnosis not present

## 2023-09-26 DIAGNOSIS — M170 Bilateral primary osteoarthritis of knee: Secondary | ICD-10-CM | POA: Diagnosis not present

## 2023-10-03 DIAGNOSIS — M1712 Unilateral primary osteoarthritis, left knee: Secondary | ICD-10-CM | POA: Diagnosis not present

## 2023-10-03 DIAGNOSIS — M170 Bilateral primary osteoarthritis of knee: Secondary | ICD-10-CM | POA: Diagnosis not present

## 2023-10-03 DIAGNOSIS — M1711 Unilateral primary osteoarthritis, right knee: Secondary | ICD-10-CM | POA: Diagnosis not present

## 2023-10-03 DIAGNOSIS — C44329 Squamous cell carcinoma of skin of other parts of face: Secondary | ICD-10-CM | POA: Diagnosis not present

## 2023-12-05 ENCOUNTER — Other Ambulatory Visit: Payer: Medicare HMO

## 2023-12-12 ENCOUNTER — Other Ambulatory Visit

## 2023-12-12 DIAGNOSIS — R972 Elevated prostate specific antigen [PSA]: Secondary | ICD-10-CM

## 2023-12-13 ENCOUNTER — Ambulatory Visit: Payer: Self-pay

## 2023-12-13 LAB — PSA, TOTAL AND FREE
PSA, Free Pct: 8.5 %
PSA, Free: 0.68 ng/mL
Prostate Specific Ag, Serum: 8 ng/mL — ABNORMAL HIGH (ref 0.0–4.0)

## 2023-12-13 NOTE — Telephone Encounter (Signed)
-----   Message from Homero Luster sent at 12/13/2023  9:11 AM EDT ----- His PSA is back up to 8 with a low f/t ratio.   I would like to send a urine for the ExoDX test if you could have him come for that and then he would need f/u with one of the other MD's with the  results unless he would like to come to [place].  If the ExoDx is elevated, we would need to do a biopsy under anesthesia.   I could sign the order when I am up but it might be best to have one of  the other docs sign it since I just have next week left.  ----- Message ----- From: Dyke Glasser, CMA Sent: 12/13/2023   8:13 AM EDT To: Homero Luster, MD  Please review. ----- Message ----- From: Garner Jury Lab Results In Sent: 12/13/2023   5:37 AM EDT To: [facility]

## 2023-12-13 NOTE — Telephone Encounter (Signed)
 Called pt to give Psa results per MD wrenn, schedule lab appointment for ExoDx test, and to ask if the pt would like to f/u with MD Inga Manges in [place] our with a MD here in [place] pt stated he would like to f/u w/ MD dahlstedy pt was scheduled w MD Dahlstedt for August with the advicement depending on ExoDx results he may need to be seen sooner

## 2023-12-16 ENCOUNTER — Other Ambulatory Visit

## 2023-12-16 DIAGNOSIS — R972 Elevated prostate specific antigen [PSA]: Secondary | ICD-10-CM | POA: Diagnosis not present

## 2023-12-16 NOTE — Progress Notes (Unsigned)
 ExoDx test  confirmation code: [id number]   Tracking Number: [id number]

## 2023-12-17 DIAGNOSIS — Z85038 Personal history of other malignant neoplasm of large intestine: Secondary | ICD-10-CM | POA: Diagnosis not present

## 2023-12-17 DIAGNOSIS — Z933 Colostomy status: Secondary | ICD-10-CM | POA: Diagnosis not present

## 2023-12-26 DIAGNOSIS — R7301 Impaired fasting glucose: Secondary | ICD-10-CM | POA: Diagnosis not present

## 2023-12-26 DIAGNOSIS — E782 Mixed hyperlipidemia: Secondary | ICD-10-CM | POA: Diagnosis not present

## 2024-01-01 DIAGNOSIS — N3281 Overactive bladder: Secondary | ICD-10-CM | POA: Diagnosis not present

## 2024-01-01 DIAGNOSIS — M170 Bilateral primary osteoarthritis of knee: Secondary | ICD-10-CM | POA: Diagnosis not present

## 2024-01-01 DIAGNOSIS — I700 Atherosclerosis of aorta: Secondary | ICD-10-CM | POA: Diagnosis not present

## 2024-01-01 DIAGNOSIS — R972 Elevated prostate specific antigen [PSA]: Secondary | ICD-10-CM | POA: Diagnosis not present

## 2024-01-01 DIAGNOSIS — R600 Localized edema: Secondary | ICD-10-CM | POA: Diagnosis not present

## 2024-01-01 DIAGNOSIS — E782 Mixed hyperlipidemia: Secondary | ICD-10-CM | POA: Diagnosis not present

## 2024-01-01 DIAGNOSIS — E663 Overweight: Secondary | ICD-10-CM | POA: Diagnosis not present

## 2024-01-01 DIAGNOSIS — R7301 Impaired fasting glucose: Secondary | ICD-10-CM | POA: Diagnosis not present

## 2024-01-01 DIAGNOSIS — E875 Hyperkalemia: Secondary | ICD-10-CM | POA: Diagnosis not present

## 2024-01-01 DIAGNOSIS — I10 Essential (primary) hypertension: Secondary | ICD-10-CM | POA: Diagnosis not present

## 2024-01-01 DIAGNOSIS — Z85038 Personal history of other malignant neoplasm of large intestine: Secondary | ICD-10-CM | POA: Diagnosis not present

## 2024-01-01 DIAGNOSIS — Z96641 Presence of right artificial hip joint: Secondary | ICD-10-CM | POA: Diagnosis not present

## 2024-01-30 DIAGNOSIS — Z87891 Personal history of nicotine dependence: Secondary | ICD-10-CM | POA: Diagnosis not present

## 2024-01-30 DIAGNOSIS — Z8249 Family history of ischemic heart disease and other diseases of the circulatory system: Secondary | ICD-10-CM | POA: Diagnosis not present

## 2024-01-30 DIAGNOSIS — E785 Hyperlipidemia, unspecified: Secondary | ICD-10-CM | POA: Diagnosis not present

## 2024-01-30 DIAGNOSIS — Z85038 Personal history of other malignant neoplasm of large intestine: Secondary | ICD-10-CM | POA: Diagnosis not present

## 2024-01-30 DIAGNOSIS — Z833 Family history of diabetes mellitus: Secondary | ICD-10-CM | POA: Diagnosis not present

## 2024-01-30 DIAGNOSIS — Z809 Family history of malignant neoplasm, unspecified: Secondary | ICD-10-CM | POA: Diagnosis not present

## 2024-01-30 DIAGNOSIS — N400 Enlarged prostate without lower urinary tract symptoms: Secondary | ICD-10-CM | POA: Diagnosis not present

## 2024-01-30 DIAGNOSIS — G629 Polyneuropathy, unspecified: Secondary | ICD-10-CM | POA: Diagnosis not present

## 2024-01-30 DIAGNOSIS — Z85828 Personal history of other malignant neoplasm of skin: Secondary | ICD-10-CM | POA: Diagnosis not present

## 2024-01-30 DIAGNOSIS — Z96649 Presence of unspecified artificial hip joint: Secondary | ICD-10-CM | POA: Diagnosis not present

## 2024-01-30 DIAGNOSIS — M1990 Unspecified osteoarthritis, unspecified site: Secondary | ICD-10-CM | POA: Diagnosis not present

## 2024-01-30 DIAGNOSIS — I10 Essential (primary) hypertension: Secondary | ICD-10-CM | POA: Diagnosis not present

## 2024-02-04 ENCOUNTER — Ambulatory Visit: Admitting: Urology

## 2024-02-26 NOTE — Patient Instructions (Signed)
 SURGICAL WAITING ROOM VISITATION Patients having surgery or a procedure may have no more than 2 support people in the waiting area - these visitors may rotate in the visitor waiting room.   Due to an increase in RSV and influenza rates and associated hospitalizations, children ages 6 and under may not visit patients in [facility] hospitals. If the patient needs to stay at the hospital during part of their recovery, the visitor guidelines for inpatient rooms apply.  PRE-OP VISITATION  Pre-op nurse will coordinate an appropriate time for 1 support person to accompany the patient in pre-op.  This support person may not rotate.  This visitor will be contacted when the time is appropriate for the visitor to come back in the pre-op area.  Please refer to the [facility] website for the visitor guidelines for Inpatients (after your surgery is over and you are in a regular room).  You are not required to quarantine at this time prior to your surgery. However, you must do this: Hand Hygiene often Do NOT share personal items Notify your provider if you are in close contact with someone who has COVID or you develop fever 100.4 or greater, new onset of sneezing, cough, sore throat, shortness of breath or body aches.  If you test positive for Covid or have been in contact with anyone that has tested positive in the last 10 days please notify you surgeon.    Your procedure is scheduled on: 03/31/24   Report to [facility] Main Entrance: [place] [address] entrance where the [employer] is available.   Report to admitting at: 5:15 AM  Call this number if you have any questions or problems the morning of surgery [phone]  FOLLOW ANY ADDITIONAL PRE OP INSTRUCTIONS YOU RECEIVED FROM YOUR SURGEON'S OFFICE!!!  Do not eat food after Midnight the night prior to your surgery/procedure.  After Midnight you may have the following liquids until: 4:15 AM DAY OF SURGERY  Clear Liquid Diet Water  Black  Coffee (sugar ok, NO MILK/CREAM OR CREAMERS)  Tea (sugar ok, NO MILK/CREAM OR CREAMERS) regular and decaf                             Plain Jell-O  with no fruit (NO RED)                                           Fruit ices (not with fruit pulp, NO RED)                                     Popsicles (NO RED)                                                                  Juice: NO CITRUS JUICES: only apple, WHITE grape, WHITE cranberry Sports drinks like Gatorade or Powerade (NO RED)   The day of surgery:  Drink ONE (1) Pre-Surgery Clear Ensure at : 4:15 AM the morning of surgery. Drink in one sitting. Do not sip.  This drink was given  to you during your hospital pre-op appointment visit. Nothing else to drink after completing the Pre-Surgery Clear Ensure or G2 : No candy, chewing gum or throat lozenges.    Oral Hygiene is also important to reduce your risk of infection.        Remember - BRUSH YOUR TEETH THE MORNING OF SURGERY WITH YOUR REGULAR TOOTHPASTE  Do NOT smoke after Midnight the night before surgery.  STOP TAKING all Vitamins, Herbs and supplements 1 week before your surgery.   Take ONLY these medicines the morning of surgery with A SIP OF WATER : amlodipine ,tamsulosin .  If You have been diagnosed with Sleep Apnea - Bring CPAP mask and tubing day of surgery. We will provide you with a CPAP machine on the day of your surgery.                   You may not have any metal on your body including hair pins, jewelry, and body piercing  Do not wear make-up, lotions, powders, perfumes / cologne, or deodorant  Do not wear nail polish including gel and S&S, artificial / acrylic nails, or any other type of covering on natural nails including finger and toenails. If you have artificial nails, gel coating, etc., that needs to be removed by a nail salon, Please have this removed prior to surgery. Not doing so may mean that your surgery could be cancelled or delayed if the Surgeon or anesthesia  staff feels like they are unable to monitor you safely.   Do not shave 48 hours prior to surgery to avoid nicks in your skin which may contribute to postoperative infections.   Contacts, Hearing Aids, dentures or bridgework may not be worn into surgery. DENTURES WILL BE REMOVED PRIOR TO SURGERY PLEASE DO NOT APPLY Poly grip OR ADHESIVES!!!  You may bring a small overnight bag with you on the day of surgery, only pack items that are not valuable. [facility] IS NOT RESPONSIBLE   FOR VALUABLES THAT ARE LOST OR STOLEN.   Patients discharged on the day of surgery will not be allowed to drive home.  Someone NEEDS to stay with you for the first 24 hours after anesthesia.  Do not bring your home medications to the hospital. The Pharmacy will dispense medications listed on your medication list to you during your admission in the Hospital.  Special Instructions: Bring a copy of your healthcare power of attorney and living will documents the day of surgery, if you wish to have them scanned into your [facility] Records- EPIC  Please read over the following fact sheets you were given: IF YOU HAVE QUESTIONS ABOUT YOUR PRE-OP INSTRUCTIONS, PLEASE CALL [phone]  PATIENT SIGNATURE_________________________________  NURSE SIGNATURE__________________________________  ________________________________________________________________________    Pre-operative 5 CHG Bath Instructions   You can play a key role in reducing the risk of infection after surgery. Your skin needs to be as free of germs as possible. You can reduce the number of germs on your skin by washing with CHG (chlorhexidine  gluconate) soap before surgery. CHG is an antiseptic soap that kills germs and continues to kill germs even after washing.   DO NOT use if you have an allergy to chlorhexidine /CHG or antibacterial soaps. If your skin becomes reddened or irritated, stop using the CHG and notify one of our RNs at [phone].    Please shower with the CHG soap starting 4 days before surgery using the following schedule:     Please keep in mind the following:  DO NOT shave, including legs and underarms, starting the day of your first shower.   You may shave your face at any point before/day of surgery.  Place clean sheets on your bed the day you start using CHG soap. Use a clean washcloth (not used since being washed) for each shower. DO NOT sleep with pets once you start using the CHG.   CHG Shower Instructions:  If you choose to wash your hair and private area, wash first with your normal shampoo/soap.  After you use shampoo/soap, rinse your hair and body thoroughly to remove shampoo/soap residue.  Turn the water  OFF and apply about 3 tablespoons (45 ml) of CHG soap to a CLEAN washcloth.  Apply CHG soap ONLY FROM YOUR NECK DOWN TO YOUR TOES (washing for 3-5 minutes)  DO NOT use CHG soap on face, private areas, open wounds, or sores.  Pay special attention to the area where your surgery is being performed.  If you are having back surgery, having someone wash your back for you may be helpful. Wait 2 minutes after CHG soap is applied, then you may rinse off the CHG soap.  Pat dry with a clean towel  Put on clean clothes/pajamas   If you choose to wear lotion, please use ONLY the CHG-compatible lotions on the back of this paper.     Additional instructions for the day of surgery: DO NOT APPLY any lotions, deodorants, cologne, or perfumes.   Put on clean/comfortable clothes.  Brush your teeth.  Ask your nurse before applying any prescription medications to the skin.   CHG Compatible Lotions   Aveeno Moisturizing lotion  Cetaphil Moisturizing Cream  Cetaphil Moisturizing Lotion  Clairol Herbal Essence Moisturizing Lotion, Dry Skin  Clairol Herbal Essence Moisturizing Lotion, Extra Dry Skin  Clairol Herbal Essence Moisturizing Lotion, Normal Skin  Curel Age Defying Therapeutic Moisturizing Lotion with  Alpha Hydroxy  Curel Extreme Care Body Lotion  Curel Soothing Hands Moisturizing Hand Lotion  Curel Therapeutic Moisturizing Cream, Fragrance-Free  Curel Therapeutic Moisturizing Lotion, Fragrance-Free  Curel Therapeutic Moisturizing Lotion, Original Formula  Eucerin Daily Replenishing Lotion  Eucerin Dry Skin Therapy Plus Alpha Hydroxy Crme  Eucerin Dry Skin Therapy Plus Alpha Hydroxy Lotion  Eucerin Original Crme  Eucerin Original Lotion  Eucerin Plus Crme Eucerin Plus Lotion  Eucerin TriLipid Replenishing Lotion  Keri Anti-Bacterial Hand Lotion  Keri Deep Conditioning Original Lotion Dry Skin Formula Softly Scented  Keri Deep Conditioning Original Lotion, Fragrance Free Sensitive Skin Formula  Keri Lotion Fast Absorbing Fragrance Free Sensitive Skin Formula  Keri Lotion Fast Absorbing Softly Scented Dry Skin Formula  Keri Original Lotion  Keri Skin Renewal Lotion Keri Silky Smooth Lotion  Keri Silky Smooth Sensitive Skin Lotion  Nivea Body Creamy Conditioning Oil  Nivea Body Extra Enriched Lotion  Nivea Body Original Lotion  Nivea Body Sheer Moisturizing Lotion Nivea Crme  Nivea Skin Firming Lotion  NutraDerm 30 Skin Lotion  NutraDerm Skin Lotion  NutraDerm Therapeutic Skin Cream  NutraDerm Therapeutic Skin Lotion  ProShield Protective Hand Cream  Provon moisturizing lotion   Incentive Spirometer  An incentive spirometer is a tool that can help keep your lungs clear and active. This tool measures how well you are filling your lungs with each breath. Taking long deep breaths may help reverse or decrease the chance of developing breathing (pulmonary) problems (especially infection) following: A long period of time when you are unable to move or be active. BEFORE THE PROCEDURE  If the spirometer includes an indicator  to show your best effort, your nurse or respiratory therapist will set it to a desired goal. If possible, sit up straight or lean slightly forward. Try not to  slouch. Hold the incentive spirometer in an upright position. INSTRUCTIONS FOR USE  Sit on the edge of your bed if possible, or sit up as far as you can in bed or on a chair. Hold the incentive spirometer in an upright position. Breathe out normally. Place the mouthpiece in your mouth and seal your lips tightly around it. Breathe in slowly and as deeply as possible, raising the piston or the ball toward the top of the column. Hold your breath for 3-5 seconds or for as long as possible. Allow the piston or ball to fall to the bottom of the column. Remove the mouthpiece from your mouth and breathe out normally. Rest for a few seconds and repeat Steps 1 through 7 at least 10 times every 1-2 hours when you are awake. Take your time and take a few normal breaths between deep breaths. The spirometer may include an indicator to show your best effort. Use the indicator as a goal to work toward during each repetition. After each set of 10 deep breaths, practice coughing to be sure your lungs are clear. If you have an incision (the cut made at the time of surgery), support your incision when coughing by placing a pillow or rolled up towels firmly against it. Once you are able to get out of bed, walk around indoors and cough well. You may stop using the incentive spirometer when instructed by your caregiver.  RISKS AND COMPLICATIONS Take your time so you do not get dizzy or light-headed. If you are in pain, you may need to take or ask for pain medication before doing incentive spirometry. It is harder to take a deep breath if you are having pain. AFTER USE Rest and breathe slowly and easily. It can be helpful to keep track of a log of your progress. Your caregiver can provide you with a simple table to help with this. If you are using the spirometer at home, follow these instructions: SEEK MEDICAL CARE IF:  You are having difficultly using the spirometer. You have trouble using the spirometer as often as  instructed. Your pain medication is not giving enough relief while using the spirometer. You develop fever of 100.5 F (38.1 C) or higher. SEEK IMMEDIATE MEDICAL CARE IF:  You cough up bloody sputum that had not been present before. You develop fever of 102 F (38.9 C) or greater. You develop worsening pain at or near the incision site. MAKE SURE YOU:  Understand these instructions. Will watch your condition. Will get help right away if you are not doing well or get worse. Document Released: 10/22/2006 Document Revised: 09/03/2011 Document Reviewed: 12/23/2006 [facility] Patient Information 2014 [place], [place].   ________________________________________________________________________

## 2024-03-11 DIAGNOSIS — H04123 Dry eye syndrome of bilateral lacrimal glands: Secondary | ICD-10-CM | POA: Diagnosis not present

## 2024-03-17 DIAGNOSIS — Z85038 Personal history of other malignant neoplasm of large intestine: Secondary | ICD-10-CM | POA: Diagnosis not present

## 2024-03-17 DIAGNOSIS — Z933 Colostomy status: Secondary | ICD-10-CM | POA: Diagnosis not present

## 2024-03-23 ENCOUNTER — Other Ambulatory Visit: Payer: Self-pay

## 2024-03-23 ENCOUNTER — Encounter (HOSPITAL_COMMUNITY): Payer: Self-pay

## 2024-03-23 ENCOUNTER — Encounter (HOSPITAL_COMMUNITY)
Admission: RE | Admit: 2024-03-23 | Discharge: 2024-03-23 | Disposition: A | Discharge status: Home / Self Care | Source: Ambulatory Visit | Attending: Orthopedic Surgery | Admitting: Orthopedic Surgery

## 2024-03-23 VITALS — BP 119/66 | HR 76 | Temp 98.1°F | Ht 64.0 in | Wt 158.0 lb

## 2024-03-23 DIAGNOSIS — Z01818 Encounter for other preprocedural examination: Secondary | ICD-10-CM | POA: Insufficient documentation

## 2024-03-23 DIAGNOSIS — I10 Essential (primary) hypertension: Secondary | ICD-10-CM | POA: Insufficient documentation

## 2024-03-23 LAB — SURGICAL PCR SCREEN
MRSA, PCR: NEGATIVE
Staphylococcus aureus: NEGATIVE

## 2024-03-23 LAB — CBC
HCT: 45.1 % (ref 39.0–52.0)
Hemoglobin: 15.5 g/dL (ref 13.0–17.0)
MCH: 31.4 pg (ref 26.0–34.0)
MCHC: 34.4 g/dL (ref 30.0–36.0)
MCV: 91.5 fL (ref 80.0–100.0)
Platelets: 205 K/uL (ref 150–400)
RBC: 4.93 MIL/uL (ref 4.22–5.81)
RDW: 13 % (ref 11.5–15.5)
WBC: 8.7 K/uL (ref 4.0–10.5)
nRBC: 0 % (ref 0.0–0.2)

## 2024-03-23 LAB — BASIC METABOLIC PANEL WITH GFR
Anion gap: 11 (ref 5–15)
BUN: 10 mg/dL (ref 8–23)
CO2: 26 mmol/L (ref 22–32)
Calcium: 10 mg/dL (ref 8.9–10.3)
Chloride: 103 mmol/L (ref 98–111)
Creatinine, Ser: 0.87 mg/dL (ref 0.61–1.24)
GFR, Estimated: >60 mL/min (ref >60)
Glucose, Bld: 99 mg/dL (ref 70–99)
Potassium: 3.7 mmol/L (ref 3.5–5.1)
Sodium: 140 mmol/L (ref 135–145)

## 2024-03-23 NOTE — Progress Notes (Signed)
 For Anesthesia: PCP - Shona Norleen PEDLAR, MD  Cardiologist - N/A  Bowel Prep reminder:  Chest xHall, Norleen PEDLAR, MD -ray -  EKG - 03/23/24 Stress Test -  ECHO -  Cardiac Cath -  Pacemaker/ICD device last checked: Pacemaker orders received: Device Rep notified:  Spinal Cord Stimulator:N/A  Sleep Study - N/A CPAP -   Fasting Blood Sugar - N/A Checks Blood Sugar _____ times a day Date and result of last Hgb A1c-  Last dose of GLP1 agonist- N/A GLP1 instructions: Hold 7 days prior to schedule (Hold 24 hours-daily)   Last dose of SGLT-2 inhibitors- N/A SGLT-2 instructions: Hold 72 hours prior to surgery  Blood Thinner Instructions:N/A Last Dose: Time last taken:  Aspirin  Instructions:N/A Last Dose: Time last taken:  Activity level: Can go up a flight of stairs and activities of daily living without stopping and without chest pain and/or shortness of breath   Able to exercise without chest pain and/or shortness of breath  Anesthesia review: Hx: HTN  Patient denies shortness of breath, fever, cough and chest pain at PAT appointment   Patient verbalized understanding of instructions that were reviewed over the telephone.

## 2024-03-31 ENCOUNTER — Encounter (HOSPITAL_COMMUNITY): Payer: Self-pay | Admitting: Orthopedic Surgery

## 2024-03-31 ENCOUNTER — Ambulatory Visit (HOSPITAL_COMMUNITY): Payer: Self-pay | Admitting: Physician Assistant

## 2024-03-31 ENCOUNTER — Other Ambulatory Visit: Payer: Self-pay

## 2024-03-31 ENCOUNTER — Encounter (HOSPITAL_COMMUNITY)
Admission: RE | Disposition: A | Discharge status: Home / Self Care | Payer: Self-pay | Source: Home / Self Care | Attending: Orthopedic Surgery

## 2024-03-31 ENCOUNTER — Ambulatory Visit (HOSPITAL_COMMUNITY): Payer: Self-pay | Admitting: Certified Registered"

## 2024-03-31 ENCOUNTER — Ambulatory Visit (HOSPITAL_COMMUNITY)
Admission: RE | Admit: 2024-03-31 | Discharge: 2024-03-31 | Disposition: A | Discharge status: Home / Self Care | Attending: Orthopedic Surgery | Admitting: Orthopedic Surgery

## 2024-03-31 DIAGNOSIS — Z87891 Personal history of nicotine dependence: Secondary | ICD-10-CM | POA: Insufficient documentation

## 2024-03-31 DIAGNOSIS — Z01818 Encounter for other preprocedural examination: Secondary | ICD-10-CM

## 2024-03-31 DIAGNOSIS — M1711 Unilateral primary osteoarthritis, right knee: Secondary | ICD-10-CM | POA: Insufficient documentation

## 2024-03-31 DIAGNOSIS — Z96651 Presence of right artificial knee joint: Secondary | ICD-10-CM

## 2024-03-31 DIAGNOSIS — G8918 Other acute postprocedural pain: Secondary | ICD-10-CM | POA: Diagnosis not present

## 2024-03-31 DIAGNOSIS — I10 Essential (primary) hypertension: Secondary | ICD-10-CM

## 2024-03-31 HISTORY — PX: TOTAL KNEE ARTHROPLASTY: SHX125

## 2024-03-31 SURGERY — ARTHROPLASTY, KNEE, TOTAL
Anesthesia: Spinal | Site: Knee | Laterality: Right

## 2024-03-31 MED ORDER — ONDANSETRON HCL 4 MG PO TABS: 4.0000 mg | ORAL_TABLET | Freq: Four times a day (QID) | ORAL | Status: DC | PRN

## 2024-03-31 MED ORDER — 0.9 % SODIUM CHLORIDE (POUR BTL) OPTIME
TOPICAL | Status: DC | PRN
  Administered 2024-03-31: 1000 mL

## 2024-03-31 MED ORDER — PROPOFOL 10 MG/ML IV BOLUS
INTRAVENOUS | Status: DC | PRN
  Administered 2024-03-31: 30 mg via INTRAVENOUS
  Administered 2024-03-31: 100 ug/kg/min via INTRAVENOUS

## 2024-03-31 MED ORDER — PHENYLEPHRINE 80 MCG/ML (10ML) SYRINGE FOR IV PUSH (FOR BLOOD PRESSURE SUPPORT)
PREFILLED_SYRINGE | INTRAVENOUS | Status: DC | PRN
  Administered 2024-03-31: 80 ug via INTRAVENOUS
  Administered 2024-03-31: 160 ug via INTRAVENOUS
  Administered 2024-03-31: 80 ug via INTRAVENOUS
  Administered 2024-03-31 (×2): 160 ug via INTRAVENOUS
  Administered 2024-03-31: 120 ug via INTRAVENOUS
  Administered 2024-03-31: 160 ug via INTRAVENOUS

## 2024-03-31 MED ORDER — ACETAMINOPHEN 10 MG/ML IV SOLN: 1000.0000 mg | Freq: Once | INTRAVENOUS | Status: DC | PRN

## 2024-03-31 MED ORDER — POVIDONE-IODINE 10 % EX SWAB: 2.0000 | Freq: Once | CUTANEOUS | Status: DC

## 2024-03-31 MED ORDER — OXYCODONE HCL 5 MG/5ML PO SOLN: 5.0000 mg | Freq: Once | ORAL | Status: DC | PRN

## 2024-03-31 MED ORDER — ONDANSETRON HCL 4 MG/2ML IJ SOLN: 4.0000 mg | Freq: Once | INTRAMUSCULAR | Status: DC | PRN

## 2024-03-31 MED ORDER — FENTANYL CITRATE PF 50 MCG/ML IJ SOSY
PREFILLED_SYRINGE | INTRAMUSCULAR | Status: AC
  Filled 2024-03-31: qty 1

## 2024-03-31 MED ORDER — METHOCARBAMOL 500 MG PO TABS
500.0000 mg | ORAL_TABLET | Freq: Four times a day (QID) | ORAL | Status: DC | PRN
  Administered 2024-03-31: 500 mg via ORAL

## 2024-03-31 MED ORDER — FENTANYL CITRATE (PF) 100 MCG/2ML IJ SOLN
INTRAMUSCULAR | Status: DC | PRN
  Administered 2024-03-31: 50 ug via INTRAVENOUS

## 2024-03-31 MED ORDER — ORAL CARE MOUTH RINSE: 15.0000 mL | Freq: Once | OROMUCOSAL | Status: AC

## 2024-03-31 MED ORDER — FENTANYL CITRATE (PF) 100 MCG/2ML IJ SOLN
INTRAMUSCULAR | Status: AC
  Filled 2024-03-31: qty 2

## 2024-03-31 MED ORDER — OXYCODONE HCL 5 MG PO TABS: 10.0000 mg | ORAL_TABLET | ORAL | Status: DC | PRN

## 2024-03-31 MED ORDER — PHENYLEPHRINE 80 MCG/ML (10ML) SYRINGE FOR IV PUSH (FOR BLOOD PRESSURE SUPPORT)
PREFILLED_SYRINGE | INTRAVENOUS | Status: AC
  Filled 2024-03-31: qty 10

## 2024-03-31 MED ORDER — TRANEXAMIC ACID-NACL 1000-0.7 MG/100ML-% IV SOLN: 1000.0000 mg | Freq: Once | INTRAVENOUS | Status: DC

## 2024-03-31 MED ORDER — HYDROMORPHONE HCL 1 MG/ML IJ SOLN: 0.5000 mg | INTRAMUSCULAR | Status: DC | PRN

## 2024-03-31 MED ORDER — OXYCODONE HCL 5 MG PO TABS: 5.0000 mg | ORAL_TABLET | Freq: Once | ORAL | Status: DC | PRN

## 2024-03-31 MED ORDER — KETOROLAC TROMETHAMINE 30 MG/ML IJ SOLN
INTRAMUSCULAR | Status: AC
  Filled 2024-03-31: qty 1

## 2024-03-31 MED ORDER — PHENYLEPHRINE HCL-NACL 20-0.9 MG/250ML-% IV SOLN
INTRAVENOUS | Status: AC
  Filled 2024-03-31: qty 500

## 2024-03-31 MED ORDER — OXYCODONE HCL 5 MG PO TABS
5.0000 mg | ORAL_TABLET | ORAL | Status: DC | PRN
  Administered 2024-03-31: 5 mg via ORAL

## 2024-03-31 MED ORDER — SODIUM CHLORIDE 0.9 % IR SOLN
Status: DC | PRN
  Administered 2024-03-31: 1000 mL

## 2024-03-31 MED ORDER — EPHEDRINE 5 MG/ML INJ
INTRAVENOUS | Status: AC
  Filled 2024-03-31: qty 5

## 2024-03-31 MED ORDER — FENTANYL CITRATE PF 50 MCG/ML IJ SOSY
25.0000 ug | PREFILLED_SYRINGE | INTRAMUSCULAR | Status: DC | PRN
  Administered 2024-03-31: 50 ug via INTRAVENOUS

## 2024-03-31 MED ORDER — MEPIVACAINE HCL (PF) 2 % IJ SOLN
INTRAMUSCULAR | Status: AC
  Filled 2024-03-31: qty 20

## 2024-03-31 MED ORDER — METOCLOPRAMIDE HCL 5 MG PO TABS: 5.0000 mg | ORAL_TABLET | Freq: Three times a day (TID) | ORAL | Status: DC | PRN

## 2024-03-31 MED ORDER — CEFAZOLIN SODIUM-DEXTROSE 2-4 GM/100ML-% IV SOLN: 2.0000 g | Freq: Four times a day (QID) | INTRAVENOUS | Status: DC

## 2024-03-31 MED ORDER — SODIUM CHLORIDE (PF) 0.9 % IJ SOLN
INTRAMUSCULAR | Status: DC | PRN
  Administered 2024-03-31: 61 mL

## 2024-03-31 MED ORDER — METHOCARBAMOL 1000 MG/10ML IJ SOLN: 500.0000 mg | Freq: Four times a day (QID) | INTRAMUSCULAR | Status: DC | PRN

## 2024-03-31 MED ORDER — CHLORHEXIDINE GLUCONATE 0.12 % MT SOLN
15.0000 mL | Freq: Once | OROMUCOSAL | Status: AC
  Administered 2024-03-31: 15 mL via OROMUCOSAL

## 2024-03-31 MED ORDER — ONDANSETRON HCL 4 MG/2ML IJ SOLN: 4.0000 mg | Freq: Four times a day (QID) | INTRAMUSCULAR | Status: DC | PRN

## 2024-03-31 MED ORDER — LACTATED RINGERS IV SOLN: INTRAVENOUS | Status: DC

## 2024-03-31 MED ORDER — ACETAMINOPHEN 500 MG PO TABS
1000.0000 mg | ORAL_TABLET | Freq: Four times a day (QID) | ORAL | Status: DC
  Administered 2024-03-31: 1000 mg via ORAL

## 2024-03-31 MED ORDER — METHOCARBAMOL 500 MG PO TABS
ORAL_TABLET | ORAL | Status: AC
  Filled 2024-03-31: qty 1

## 2024-03-31 MED ORDER — PHENYLEPHRINE HCL-NACL 20-0.9 MG/250ML-% IV SOLN
INTRAVENOUS | Status: DC | PRN
  Administered 2024-03-31: 25 ug/min via INTRAVENOUS

## 2024-03-31 MED ORDER — PROPOFOL 10 MG/ML IV BOLUS
INTRAVENOUS | Status: AC
  Filled 2024-03-31: qty 20

## 2024-03-31 MED ORDER — DEXAMETHASONE SODIUM PHOSPHATE 10 MG/ML IJ SOLN
INTRAMUSCULAR | Status: AC
  Filled 2024-03-31: qty 1

## 2024-03-31 MED ORDER — METOCLOPRAMIDE HCL 5 MG/ML IJ SOLN: 5.0000 mg | Freq: Three times a day (TID) | INTRAMUSCULAR | Status: DC | PRN

## 2024-03-31 MED ORDER — CEFAZOLIN SODIUM-DEXTROSE 2-4 GM/100ML-% IV SOLN
2.0000 g | INTRAVENOUS | Status: AC
  Administered 2024-03-31: 2 g via INTRAVENOUS
  Filled 2024-03-31: qty 100

## 2024-03-31 MED ORDER — MIDAZOLAM HCL 2 MG/2ML IJ SOLN
INTRAMUSCULAR | Status: AC
  Filled 2024-03-31: qty 2

## 2024-03-31 MED ORDER — OXYCODONE HCL 5 MG PO TABS
ORAL_TABLET | ORAL | Status: AC
  Filled 2024-03-31: qty 1

## 2024-03-31 MED ORDER — TRANEXAMIC ACID-NACL 1000-0.7 MG/100ML-% IV SOLN
1000.0000 mg | INTRAVENOUS | Status: AC
  Administered 2024-03-31: 1000 mg via INTRAVENOUS
  Filled 2024-03-31: qty 100

## 2024-03-31 MED ORDER — ACETAMINOPHEN 500 MG PO TABS
ORAL_TABLET | ORAL | Status: AC
  Filled 2024-03-31: qty 2

## 2024-03-31 MED ORDER — STERILE WATER FOR IRRIGATION IR SOLN
Status: DC | PRN
  Administered 2024-03-31: 2000 mL

## 2024-03-31 MED ORDER — BUPIVACAINE-EPINEPHRINE (PF) 0.25% -1:200000 IJ SOLN
INTRAMUSCULAR | Status: AC
  Filled 2024-03-31: qty 30

## 2024-03-31 MED ORDER — DEXAMETHASONE SODIUM PHOSPHATE 10 MG/ML IJ SOLN
8.0000 mg | Freq: Once | INTRAMUSCULAR | Status: AC
  Administered 2024-03-31: 8 mg via INTRAVENOUS

## 2024-03-31 MED ORDER — BUPIVACAINE-EPINEPHRINE (PF) 0.5% -1:200000 IJ SOLN
INTRAMUSCULAR | Status: DC | PRN
  Administered 2024-03-31: 15 mL via PERINEURAL

## 2024-03-31 MED ORDER — LACTATED RINGERS IV BOLUS
500.0000 mL | Freq: Once | INTRAVENOUS | Status: AC
  Administered 2024-03-31: 500 mL via INTRAVENOUS

## 2024-03-31 MED ORDER — PROPOFOL 1000 MG/100ML IV EMUL
INTRAVENOUS | Status: AC
  Filled 2024-03-31: qty 100

## 2024-03-31 MED ORDER — MIDAZOLAM HCL 2 MG/2ML IJ SOLN
INTRAMUSCULAR | Status: DC | PRN
  Administered 2024-03-31: 1 mg via INTRAVENOUS

## 2024-03-31 MED ORDER — SODIUM CHLORIDE (PF) 0.9 % IJ SOLN
INTRAMUSCULAR | Status: AC
  Filled 2024-03-31: qty 30

## 2024-03-31 MED ORDER — MEPIVACAINE HCL (PF) 2 % IJ SOLN
INTRAMUSCULAR | Status: DC | PRN
Start: 2024-03-31 — End: 2024-03-31
  Administered 2024-03-31: 3 mL via INTRATHECAL

## 2024-03-31 SURGICAL SUPPLY — 46 items
ATTUNE MED ANAT PAT 38 KNEE (Knees) IMPLANT
BAG COUNTER SPONGE SURGICOUNT (BAG) IMPLANT
BAG ZIPLOCK 12X15 (MISCELLANEOUS) ×1 IMPLANT
BASEPLATE TIB CMT FB PCKT SZ5 (Knees) IMPLANT
BLADE SAW SGTL 11.0X1.19X90.0M (BLADE) IMPLANT
BLADE SAW SGTL 13.0X1.19X90.0M (BLADE) ×1 IMPLANT
BNDG ELASTIC 6INX 5YD STR LF (GAUZE/BANDAGES/DRESSINGS) ×1 IMPLANT
BOWL SMART MIX CTS (DISPOSABLE) ×1 IMPLANT
CEMENT HV SMART SET (Cement) ×2 IMPLANT
COMPONENT FEM CMT ATTN KN 5 RT (Joint) IMPLANT
COVER SURGICAL LIGHT HANDLE (MISCELLANEOUS) ×1 IMPLANT
CUFF TRNQT CYL 34X4.125X (TOURNIQUET CUFF) ×1 IMPLANT
DERMABOND ADVANCED .7 DNX12 (GAUZE/BANDAGES/DRESSINGS) ×1 IMPLANT
DRAPE U-SHAPE 47X51 STRL (DRAPES) ×1 IMPLANT
DRESSING AQUACEL AG SP 3.5X10 (GAUZE/BANDAGES/DRESSINGS) ×1 IMPLANT
DRSG AQUACEL AG ADV 3.5X10 (GAUZE/BANDAGES/DRESSINGS) IMPLANT
DURAPREP 26ML APPLICATOR (WOUND CARE) ×2 IMPLANT
ELECT REM PT RETURN 15FT ADLT (MISCELLANEOUS) ×1 IMPLANT
GLOVE BIO SURGEON STRL SZ 6 (GLOVE) ×1 IMPLANT
GLOVE BIOGEL PI IND STRL 6.5 (GLOVE) ×1 IMPLANT
GLOVE BIOGEL PI IND STRL 7.5 (GLOVE) ×1 IMPLANT
GLOVE ORTHO TXT STRL SZ7.5 (GLOVE) ×2 IMPLANT
GOWN STRL REUS W/ TWL LRG LVL3 (GOWN DISPOSABLE) ×2 IMPLANT
HOLDER FOLEY CATH W/STRAP (MISCELLANEOUS) IMPLANT
INSERT TIB ATT 5 10 MED RT (Insert) IMPLANT
KIT TURNOVER KIT A (KITS) ×1 IMPLANT
MANIFOLD NEPTUNE II (INSTRUMENTS) ×1 IMPLANT
NDL SAFETY ECLIPSE 18X1.5 (NEEDLE) IMPLANT
NS IRRIG 1000ML POUR BTL (IV SOLUTION) ×1 IMPLANT
PACK TOTAL KNEE CUSTOM (KITS) ×1 IMPLANT
PENCIL SMOKE EVACUATOR (MISCELLANEOUS) ×1 IMPLANT
PIN FIX SIGMA LCS THRD HI (PIN) IMPLANT
PROTECTOR NERVE ULNAR (MISCELLANEOUS) ×1 IMPLANT
SET HNDPC FAN SPRY TIP SCT (DISPOSABLE) ×1 IMPLANT
SET PAD KNEE POSITIONER (MISCELLANEOUS) ×1 IMPLANT
SPIKE FLUID TRANSFER (MISCELLANEOUS) ×2 IMPLANT
SUT MNCRL AB 4-0 PS2 18 (SUTURE) ×1 IMPLANT
SUT STRATAFIX PDS+ 0 24IN (SUTURE) ×1 IMPLANT
SUT VIC AB 1 CT1 36 (SUTURE) ×1 IMPLANT
SUT VIC AB 2-0 CT1 TAPERPNT 27 (SUTURE) ×2 IMPLANT
SYR 3ML LL SCALE MARK (SYRINGE) ×1 IMPLANT
TOWEL GREEN STERILE FF (TOWEL DISPOSABLE) ×1 IMPLANT
TRAY FOLEY MTR SLVR 16FR STAT (SET/KITS/TRAYS/PACK) ×1 IMPLANT
TUBE SUCTION HIGH CAP CLEAR NV (SUCTIONS) ×1 IMPLANT
WATER STERILE IRR 1000ML POUR (IV SOLUTION) ×2 IMPLANT
WRAP KNEE MAXI GEL POST OP (GAUZE/BANDAGES/DRESSINGS) ×1 IMPLANT

## 2024-03-31 NOTE — Discharge Instructions (Signed)

## 2024-03-31 NOTE — Op Note (Signed)
 NAME:  Walter Orozco                      MEDICAL RECORD NO.:  [id number]                             FACILITY:  [facility]      PHYSICIAN:  Donnice BIRCH. Ernie, M.D.  DATE OF BIRTH:  May 28, 1946      DATE OF PROCEDURE:  03/31/2024                                     OPERATIVE REPORT         PREOPERATIVE DIAGNOSIS:  Right knee osteoarthritis.      POSTOPERATIVE DIAGNOSIS:  Right knee osteoarthritis.      FINDINGS:  The patient was noted to have complete loss of cartilage and   bone-on-bone arthritis with associated osteophytes in the medial and patellofemoral compartments of   the knee with a significant synovitis and associated effusion.  The patient had failed months of conservative treatment including medications, injection therapy, activity modification.     PROCEDURE:  Right total knee replacement.      COMPONENTS USED:  DePuy Attune FB CR MS knee   system, a size 5 femur, 5 tibia, size 10 mm CR MS AOX insert, and 38 anatomic patellar   button.      SURGEON:  Donnice BIRCH. Ernie, M.D.      ASSISTANT:  Rosina Calin, PA-C.      ANESTHESIA:  Regional and Spinal.      SPECIMENS:  None.      COMPLICATION:  None.      DRAINS:  None.  EBL: <100 cc      TOURNIQUET TIME:   Total Tourniquet Time Documented: Thigh (Right) - 28 minutes Total: Thigh (Right) - 28 minutes  .      The patient was stable to the recovery room.      INDICATION FOR PROCEDURE:  Walter Orozco is a 78 y.o. male patient of   mine.  The patient had been seen, evaluated, and treated for months conservatively in the   office with medication, activity modification, and injections.  The patient had   radiographic changes of bone-on-bone arthritis with endplate sclerosis and osteophytes noted.  Based on the radiographic changes and failed conservative measures, the patient   decided to proceed with definitive treatment, total knee replacement.  Risks of infection, DVT, component failure, need for revision surgery,  neurovascular injury were reviewed in the office setting.  The postop course was reviewed stressing the efforts to maximize post-operative satisfaction and function.  Consent was obtained for benefit of pain   relief.      PROCEDURE IN DETAIL:  The patient was brought to the operative theater.   Once adequate anesthesia, preoperative antibiotics, 2 gm of Ancef ,1 gm of Tranexamic Acid , and 10 mg of Decadron  administered, the patient was positioned supine with a right thigh tourniquet placed.  The  right lower extremity was prepped and draped in sterile fashion.  A time-   out was performed identifying the patient, planned procedure, and the appropriate extremity.      The right lower extremity was placed in the [facility] leg holder.  The leg was   exsanguinated, tourniquet elevated to 225 mmHg.  A midline incision was  made followed by median parapatellar arthrotomy.  Following initial   exposure, attention was first directed to the patella.  Precut   measurement was noted to be 25 mm.  I resected down to 14 mm and used a   38 anatomic patellar button to restore patellar height as well as cover the cut surface.      The lug holes were drilled and a metal shim was placed to protect the   patella from retractors and saw blade during the procedure.      At this point, attention was now directed to the femur.  The femoral   canal was opened with a drill, irrigated to try to prevent fat emboli.  An   intramedullary rod was passed at 5 degrees valgus, 9 mm of bone was   resected off the distal femur.  Following this resection, the tibia was   subluxated anteriorly.  Using the extramedullary guide, 2 mm of bone was resected off   the proximal medial tibia.  We confirmed the gap would be   stable medially and laterally with a size 8 spacer block as well as confirmed that the tibial cut was perpendicular in the coronal plane, checking with an alignment rod.      Once this was done, I sized the femur to  be a size 5 in the anterior-   posterior dimension, chose a standard component based on medial and   lateral dimension.  The size 5 rotation block was then pinned in   position anterior referenced using the C-clamp to set rotation.  The   anterior, posterior, and  chamfer cuts were made without difficulty nor   notching making certain that I was along the anterior cortex to help   with flexion gap stability.      The final box cut was made off the lateral aspect of distal femur.      At this point, the tibia was sized to be a size 5.  The size 5 tray was   then pinned in position through the medial third of the tubercle,   drilled, and keel punched.  Trial reduction was now carried with a 5 femur,  5 tibia, a size 10 mm CR MS insert, and the 38 anatomic patella botton.  The knee was brought to full extension with good flexion stability with the patella   tracking through the trochlea without application of pressure.  Given   all these findings the trial components removed.  Final components were   opened and cement was mixed.  The knee was irrigated with normal saline solution and pulse lavage.  The synovial lining was   then injected with 30 cc of 0.25% Marcaine with epinephrine , 1 cc of Toradol and 30 cc of NS for a total of 61 cc.     Final implants were then cemented onto cleaned and dried cut surfaces of bone with the knee brought to extension with a size 10 mm CR MS trial insert.      Once the cement had fully cured, excess cement was removed   throughout the knee.  I confirmed that I was satisfied with the range of   motion and stability, and the final size 10 mm CR MS AOX insert was chosen.  It was   placed into the knee.      The tourniquet had been let down at 28 minutes.  No significant   hemostasis was required.  The extensor mechanism was then reapproximated  using #1 Vicryl and #1 Stratafix sutures with the knee   in flexion.  The   remaining wound was closed with 2-0 Vicryl  and running 4-0 Monocryl.   The knee was cleaned, dried, dressed sterilely using Dermabond and   Aquacel dressing.  The patient was then   brought to recovery room in stable condition, tolerating the procedure   well.   Please note that Physician Assistant, Rosina Calin, PA-C was present for the entirety of the case, and was utilized for pre-operative positioning, peri-operative retractor management, general facilitation of the procedure and for primary wound closure at the end of the case.              Donnice CORDOBA Ernie, M.D.    03/31/2024 8:35 AM

## 2024-03-31 NOTE — Anesthesia Procedure Notes (Signed)
 Spinal  Patient location during procedure: OR Start time: 03/31/2024 7:22 AM End time: 03/31/2024 7:25 AM Reason for block: surgical anesthesia Staffing Anesthesiologist: Keneth Lynwood POUR, MD Performed by: Keneth Lynwood POUR, MD Authorized by: Keneth Lynwood POUR, MD   Preanesthetic Checklist Completed: patient identified, IV checked, site marked, risks and benefits discussed, surgical consent, monitors and equipment checked, pre-op evaluation and timeout performed Spinal Block Patient position: sitting Prep: DuraPrep Patient monitoring: heart rate, cardiac monitor, continuous pulse ox and blood pressure Approach: midline Location: L3-4 Injection technique: single-shot Needle Needle type: Sprotte  Needle gauge: 24 G Needle length: 9 cm Assessment Sensory level: T4 Events: CSF return Additional Notes Single pass, straightforward at L3-4

## 2024-03-31 NOTE — Evaluation (Signed)
 Physical Therapy Evaluation Patient Details Name: Walter Orozco MRN: [id number] DOB: 11-11-1945 Today's Date: 03/31/2024  History of Present Illness  78 yo male presents to therapy s/p R TKA on 03/31/2024 due to failure of conservative measures. Pt PMH includes but is not limited to: R THA, AA (2020), arthritis, adenocarcinoma of sigmoid colon, HTN, and HLD.  Clinical Impression    Walter Orozco is a 78 y.o. male POD 0 s/p R TKA. Patient reports IND with mobility at baseline. Patient is now limited by functional impairments (see PT problem list below) and requires CGA for transfers and gait with RW. Patient was able to ambulate 55 and 45 feet with RW and CGA and cues for safe walker management. Patient educated on safe sequencing for stair mobility with use of RW, fall risk prevention, pain management and goal, use of CP/ice and car transfers pt and spouse verbalized understanding of safe guarding position for people assisting with mobility. Patient instructed in exercises to facilitate ROM and circulation reviewed and HO provided. Patient will benefit from continued skilled PT interventions to address impairments and progress towards PLOF. Patient has met mobility goals at adequate level for discharge home with family and social support with OPPT scheduled for 10/10; will continue to follow if pt continues acute stay to progress towards Mod I goals.       If plan is discharge home, recommend the following: A little help with walking and/or transfers;A little help with bathing/dressing/bathroom;Assistance with cooking/housework;Assist for transportation;Help with stairs or ramp for entrance   Can travel by private vehicle        Equipment Recommendations None recommended by PT  Recommendations for Other Services       Functional Status Assessment Patient has had a recent decline in their functional status and demonstrates the ability to make significant improvements in function in a reasonable  and predictable amount of time.     Precautions / Restrictions Precautions Precautions: Fall;Knee Restrictions Weight Bearing Restrictions Per Provider Order: No      Mobility  Bed Mobility Overal bed mobility: Needs Assistance Bed Mobility: Supine to Sit     Supine to sit: Supervision, HOB elevated     General bed mobility comments: min cues    Transfers Overall transfer level: Needs assistance Equipment used: Rolling walker (2 wheels) Transfers: Sit to/from Stand Sit to Stand: Contact guard assist           General transfer comment: min cues    Ambulation/Gait Ambulation/Gait assistance: Contact guard assist Gait Distance (Feet): 55 Feet Assistive device: Rolling walker (2 wheels) Gait Pattern/deviations: Step-to pattern, Decreased stance time - right, Antalgic, Trunk flexed Gait velocity: decreased     General Gait Details: slight trunk flexion with B UE support at RW to offload R LE in stance phase, noted to have leg length discrepancy with R LE > 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, min cues for RW management safety and posture  Stairs Stairs: Yes Stairs assistance: Contact guard assist Stair Management: With walker Number of Stairs: 2 General stair comments: min cues pt able to recall and perform step navigation with proper use of RW to emulate home setting with 3 steps no handarail, pt reports will have social/family support when arriving at home if needed for steps  Wheelchair Mobility     Tilt Bed    Modified Rankin (Stroke Patients Only)       Balance Overall balance assessment: Needs assistance Sitting-balance support: Feet supported Sitting balance-Leahy Scale: Good  Standing balance support: Bilateral upper extremity supported, During functional activity, Reliant on assistive device for balance Standing balance-Leahy Scale: Fair Standing balance comment: static standing no UE support                             Pertinent  Vitals/Pain Pain Assessment Pain Assessment: 0-10 Pain Score: 4  Pain Location: R knee and LE Pain Descriptors / Indicators: Aching, Constant, Discomfort, Dull, Guarding, Operative site guarding Pain Intervention(s): Limited activity within patient's tolerance, Monitored during session, Premedicated before session, Repositioned, Ice applied    Home Living Family/patient expects to be discharged to:: Private residence Living Arrangements: Spouse/significant other Available Help at Discharge: Family Type of Home: House Home Access: Stairs to enter Entrance Stairs-Rails: None Entrance Stairs-Number of Steps: 3   Home Layout: One level Home Equipment: Agricultural consultant (2 wheels);Cane - quad;Shower seat;Tub bench      Prior Function Prior Level of Function : Independent/Modified Independent             Mobility Comments: IND no AD for all ADLs, self care tasks and IADLs       Extremity/Trunk Assessment        Lower Extremity Assessment Lower Extremity Assessment: RLE deficits/detail RLE Deficits / Details: ankle DF/PF 5/5; SLR < 10 degree lag RLE Sensation: history of peripheral neuropathy    Cervical / Trunk Assessment Cervical / Trunk Assessment: Normal  Communication   Communication Communication: No apparent difficulties    Cognition Arousal: Alert Behavior During Therapy: WFL for tasks assessed/performed   PT - Cognitive impairments: No apparent impairments                         Following commands: Intact       Cueing       General Comments      Exercises Total Joint Exercises Ankle Circles/Pumps: AROM, Both, 10 reps Quad Sets: AROM, Right, 5 reps Short Arc Quad: AROM, Right, 5 reps Heel Slides: AROM, Right, 5 reps Hip ABduction/ADduction: AROM, Right, 5 reps Straight Leg Raises: AROM, Right, 5 reps Knee Flexion: AROM, Right, 5 reps, Seated   Assessment/Plan    PT Assessment Patient needs continued PT services  PT Problem List  Decreased strength;Decreased range of motion;Decreased activity tolerance;Decreased balance;Decreased mobility;Pain       PT Treatment Interventions DME instruction;Gait training;Stair training;Functional mobility training;Therapeutic activities;Therapeutic exercise;Balance training;Neuromuscular re-education;Patient/family education;Modalities    PT Goals (Current goals can be found in the Care Plan section)  Acute Rehab PT Goals Patient Stated Goal: to be able to do house/yard work, get the L knee TKA done PT Goal Formulation: With patient Time For Goal Achievement: 04/14/24 Potential to Achieve Goals: Good    Frequency 7X/week     Co-evaluation               AM-PAC PT 6 Clicks Mobility  Outcome Measure Help needed turning from your back to your side while in a flat bed without using bedrails?: None Help needed moving from lying on your back to sitting on the side of a flat bed without using bedrails?: A Little Help needed moving to and from a bed to a chair (including a wheelchair)?: A Little Help needed standing up from a chair using your arms (e.g., wheelchair or bedside chair)?: A Little Help needed to walk in hospital room?: A Little Help needed climbing 3-5 steps with a railing? : A Little 6  Click Score: 19    End of Session Equipment Utilized During Treatment: Gait belt       PT Visit Diagnosis: Unsteadiness on feet (R26.81);Other abnormalities of gait and mobility (R26.89);Muscle weakness (generalized) (M62.81);Difficulty in walking, not elsewhere classified (R26.2);Pain Pain - Right/Left: Right Pain - part of body: Knee;Leg    Time: 8940-8857 PT Time Calculation (min) (ACUTE ONLY): 43 min   Charges:   PT Evaluation $PT Eval Low Complexity: 1 Low PT Treatments $Gait Training: 8-22 mins $Therapeutic Exercise: 8-22 mins PT General Charges $$ ACUTE PT VISIT: 1 Visit         [place], PT Acute Rehab   [place] VEAR Drone 03/31/2024, 11:52 AM

## 2024-03-31 NOTE — Transfer of Care (Signed)
 Immediate Anesthesia Transfer of Care Note  Patient: Walter Orozco  Procedure(s) Performed: ARTHROPLASTY, KNEE, TOTAL (Right: Knee)  Patient Location: PACU  Anesthesia Type:Spinal  Level of Consciousness: awake and patient cooperative  Airway & Oxygen Therapy: Patient Spontanous Breathing and Patient connected to nasal cannula oxygen  Post-op Assessment: Report given to RN and Post -op Vital signs reviewed and stable  Post vital signs: Reviewed and stable  Last Vitals:  Vitals Value Taken Time  BP 107/61 03/31/24 09:00  Temp 36.7 C 03/31/24 09:00  Pulse 89 03/31/24 09:03  Resp 20 03/31/24 09:03  SpO2 96 % 03/31/24 09:03  Vitals shown include unfiled device data.  Last Pain:  Vitals:   03/31/24 0900  TempSrc:   PainSc: 0-No pain         Complications: No notable events documented.

## 2024-03-31 NOTE — Anesthesia Preprocedure Evaluation (Addendum)
 Anesthesia Evaluation  Patient identified by MRN, date of birth, ID band Patient awake    Reviewed: Allergy & Precautions, NPO status , Patient's Chart, lab work & pertinent test results, reviewed documented beta blocker date and time   History of Anesthesia Complications Negative for: history of anesthetic complications  Airway Mallampati: II  TM Distance: >3 FB     Dental no notable dental hx.    Pulmonary neg COPD, former smoker, neg PE   breath sounds clear to auscultation       Cardiovascular hypertension, (-) CAD, (-) Past MI, (-) Cardiac Stents and (-) CABG  Rhythm:Regular Rate:Normal     Neuro/Psych neg Seizures    GI/Hepatic ,neg GERD  ,,(+) neg Cirrhosis        Endo/Other    Renal/GU Renal disease     Musculoskeletal  (+) Arthritis , Osteoarthritis,    Abdominal   Peds  Hematology   Anesthesia Other Findings   Reproductive/Obstetrics                              Anesthesia Physical Anesthesia Plan  ASA: 2  Anesthesia Plan: Spinal   Post-op Pain Management: Regional block*   Induction: Intravenous  PONV Risk Score and Plan: 2 and Ondansetron  and Dexamethasone   Airway Management Planned: Natural Airway and Simple Face Mask  Additional Equipment:   Intra-op Plan:   Post-operative Plan:   Informed Consent: I have reviewed the patients History and Physical, chart, labs and discussed the procedure including the risks, benefits and alternatives for the proposed anesthesia with the patient or authorized representative who has indicated his/her understanding and acceptance.     Dental advisory given  Plan Discussed with: CRNA  Anesthesia Plan Comments:         Anesthesia Quick Evaluation

## 2024-03-31 NOTE — Anesthesia Procedure Notes (Signed)
 Date/Time: 03/31/2024 6:53 AM  Performed by: Para Jerelene CROME, CRNAOxygen Delivery Method: Nasal cannula

## 2024-03-31 NOTE — Anesthesia Procedure Notes (Signed)
 Anesthesia Regional Block: Adductor canal block   Pre-Anesthetic Checklist: , timeout performed,  Correct Patient, Correct Site, Correct Laterality,  Correct Procedure, Correct Position, site marked,  Risks and benefits discussed,  Surgical consent,  Pre-op evaluation,  At surgeon's request and post-op pain management  Laterality: Right  Prep: Maximum Sterile Barrier Precautions used, chloraprep       Needles:  Injection technique: Single-shot  Needle Type: Echogenic Needle      Needle Gauge: 20     Additional Needles:   Procedures:,,,, ultrasound used (permanent image in chart),,    Narrative:  Start time: 03/31/2024 6:55 AM End time: 03/31/2024 7:00 AM Injection made incrementally with aspirations every 5 mL.  Performed by: Personally  Anesthesiologist: Keneth Lynwood POUR, MD

## 2024-03-31 NOTE — Anesthesia Postprocedure Evaluation (Signed)
 Anesthesia Post Note  Patient: Walter Orozco  Procedure(s) Performed: ARTHROPLASTY, KNEE, TOTAL (Right: Knee)     Patient location during evaluation: PACU Anesthesia Type: Spinal Level of consciousness: awake and alert Pain management: pain level controlled Vital Signs Assessment: post-procedure vital signs reviewed and stable Respiratory status: spontaneous breathing, nonlabored ventilation, respiratory function stable and patient connected to nasal cannula oxygen Cardiovascular status: blood pressure returned to baseline and stable Postop Assessment: no apparent nausea or vomiting Anesthetic complications: no   No notable events documented.  Last Vitals:  Vitals:   03/31/24 1032 03/31/24 1100  BP:  116/63  Pulse:  84  Resp: 16   Temp:    SpO2:  96%    Last Pain:  Vitals:   03/31/24 1100  TempSrc:   PainSc: 5                  [place]wood MARLA Cornea

## 2024-03-31 NOTE — Care Plan (Signed)
 Ortho Bundle Case Management Note  Patient Details  Name: Walter Orozco MRN: [id number] Date of Birth: 1945-11-08  R TKA on 03/31/24  DCP: Home with wife  DME: No needs  PT: [name]                   DME Arranged:  N/A DME Agency:  NA  HH Arranged:    HH Agency:     Additional Comments: Please contact me with any questions of if this plan should need to change.  Lyle Pepper, CCM [facility] [phone]   03/31/2024, 8:13 AM

## 2024-03-31 NOTE — Anesthesia Procedure Notes (Addendum)
 Date/Time: 03/31/2024 7:29 AM  Performed by: Para Jerelene CROME, CRNAOxygen Delivery Method: Simple face mask

## 2024-03-31 NOTE — Interval H&P Note (Signed)
 History and Physical Interval Note:  03/31/2024 7:13 AM  Walter Orozco  has presented today for surgery, with the diagnosis of Right knee osteoarthritis.  The various methods of treatment have been discussed with the patient and family. After consideration of risks, benefits and other options for treatment, the patient has consented to  Procedure(s): ARTHROPLASTY, KNEE, TOTAL (Right) as a surgical intervention.  The patient's history has been reviewed, patient examined, no change in status, stable for surgery.  I have reviewed the patient's chart and labs.  Questions were answered to the patient's satisfaction.     [name]

## 2024-03-31 NOTE — H&P (Signed)
 TOTAL KNEE H&P  Patient is being admitted for right total knee arthroplasty.  Therapy Plans: outpatient therapy at [facility] Disposition: Home with wife Planned DVT Prophylaxis: aspirin  81mg  BID DME needed: none PCP: Dr. Darlyn Hurst - clearance received TXA: IV Allergies: iron  Anesthesia Concerns: none BMI: 25.7 Last HgbA1c: Not diabetic   Other: - hx of colon cancer 2007 - SDD - oxycodone , robaxin , tylenol , celebrex  Meds sent ahead  Subjective:  Chief Complaint:right knee pain.  HPI: Walter Orozco, 78 y.o. male, has a history of pain and functional disability in the right knee due to arthritis and has failed non-surgical conservative treatments for greater than 12 weeks to includeNSAID's and/or analgesics, corticosteriod injections, viscosupplementation injections, and activity modification.  Onset of symptoms was gradual, starting 3 years ago with gradually worsening course since that time. The patient noted no past surgery on the right knee(s).  Patient currently rates pain in the right knee(s) at 7 out of 10 with activity. Patient has worsening of pain with activity and weight bearing and pain that interferes with activities of daily living.  Patient has evidence of joint space narrowing by imaging studies.  There is no active infection.  Patient Active Problem List   Diagnosis Date Noted   S/P right THA, AA 06/02/2019   Osteoarthritis of right hip 06/02/2019   Adenocarcinoma of sigmoid colon (HCC) 06/11/2011   Past Medical History:  Diagnosis Date   Adenocarcinoma of sigmoid colon (HCC) 06/11/2011   Arthritis    High cholesterol    Hypertension     Past Surgical History:  Procedure Laterality Date   CATARACT EXTRACTION W/PHACO Left 12/23/2019   Procedure: CATARACT EXTRACTION PHACO AND INTRAOCULAR LENS PLACEMENT LEFT EYE;  Surgeon: Harrie Agent, MD;  Location: [facility];  Service: Ophthalmology;  Laterality: Left;  CDE: 14.68   CATARACT EXTRACTION W/PHACO Right  01/08/2020   Procedure: CATARACT EXTRACTION PHACO AND INTRAOCULAR LENS PLACEMENT RIGHT EYE;  Surgeon: Harrie Agent, MD;  Location: [facility];  Service: Ophthalmology;  Laterality: Right;  CDE: 18.60   COLON SURGERY     COLONOSCOPY  07/10/2012   Procedure: COLONOSCOPY;  Surgeon: Claudis RAYMOND Rivet, MD;  Location: [facility];  Service: Endoscopy;  Laterality: N/A;  1200   COLONOSCOPY N/A 02/29/2016   Procedure: COLONOSCOPY;  Surgeon: Claudis RAYMOND Rivet, MD;  Location: [facility];  Service: Endoscopy;  Laterality: N/A;  1200 - moved to 9/6 @ 10:30   COLONOSCOPY WITH PROPOFOL  N/A 03/29/2021   Procedure: COLONOSCOPY WITH PROPOFOL ;  Surgeon: Rivet Claudis RAYMOND, MD;  Location: [facility];  Service: Endoscopy;  Laterality: N/A;  12:30   TOTAL HIP ARTHROPLASTY Right 06/02/2019   Procedure: TOTAL HIP ARTHROPLASTY ANTERIOR APPROACH;  Surgeon: Ernie Cough, MD;  Location: [facility];  Service: Orthopedics;  Laterality: Right;  70 mins    Current Facility-Administered Medications  Medication Dose Route Frequency Provider Last Rate Last Admin   phenylephrine  (NEOSYNEPHRINE) 20-0.9 MG/250ML-% infusion            ceFAZolin  (ANCEF ) IVPB 2g/100 mL premix  2 g Intravenous On Call to OR Patti Rosina SAUNDERS, PA-C       dexamethasone  (DECADRON ) injection 8 mg  8 mg Intravenous Once Maiana Hennigan R, PA-C       lactated ringers  infusion   Intravenous Continuous Peggye Delon Brunswick, MD 10 mL/hr at 03/31/24 9391 New Bag at 03/31/24 0608   povidone-iodine  10 % swab 2 Application  2 Application Topical Once Patti Rosina SAUNDERS, PA-C  tranexamic acid  (CYKLOKAPRON ) IVPB 1,000 mg  1,000 mg Intravenous To OR Patti Rosina SAUNDERS, PA-C       Facility-Administered Medications Ordered in Other Encounters  Medication Dose Route Frequency Provider Last Rate Last Admin   neomycin -polymyxin b-dexamethasone  (MAXITROL ) ophthalmic suspension    PRN Harrie Agent, MD   1 drop at 12/23/19 1332   No Known Allergies  Social History    Tobacco Use   Smoking status: Former    Current packs/day: 0.00    Types: Cigarettes    Quit date: 01/22/1973    Years since quitting: 51.2   Smokeless tobacco: Former    Types: Chew, Snuff  Substance Use Topics   Alcohol use: Yes    Comment: occasional glass of wine or beer    Family History  Problem Relation Age of Onset   Cancer Father      Review of Systems  Constitutional:  Negative for chills and fever.  Respiratory:  Negative for cough and shortness of breath.   Cardiovascular:  Negative for chest pain.  Gastrointestinal:  Negative for nausea and vomiting.  Musculoskeletal:  Positive for arthralgias.     Objective:  Physical Exam Bilateral knees: Slight varus deformities bilaterally No erythema, warmth, or effusions AROM 0-120 Tender to palpation about the medial joint lines, more so on the right  Vital signs in last 24 hours: Temp:  [98.1 F (36.7 C)] 98.1 F (36.7 C) (10/07 0522) Pulse Rate:  [106] 106 (10/07 0522) Resp:  [17] 17 (10/07 0522) BP: (136)/(75) 136/75 (10/07 0522) SpO2:  [98 %] 98 % (10/07 0522) Weight:  [71 kg] 71 kg (10/07 0554)  Labs:   Estimated body mass index is 26.87 kg/m as calculated from the following:   Height as of this encounter: 5' 4 (1.626 m).   Weight as of this encounter: 71 kg.   Imaging Review Plain radiographs demonstrate severe degenerative joint disease of the right knee(s). The overall alignment isneutral. The bone quality appears to be adequate for age and reported activity level.      Assessment/Plan:  End stage arthritis, right knee   The patient history, physical examination, clinical judgment of the provider and imaging studies are consistent with end stage degenerative joint disease of the right knee(s) and total knee arthroplasty is deemed medically necessary. The treatment options including medical management, injection therapy arthroscopy and arthroplasty were discussed at length. The risks and  benefits of total knee arthroplasty were presented and reviewed. The risks due to aseptic loosening, infection, stiffness, patella tracking problems, thromboembolic complications and other imponderables were discussed. The patient acknowledged the explanation, agreed to proceed with the plan and consent was signed. Patient is being admitted for inpatient treatment for surgery, pain control, PT, OT, prophylactic antibiotics, VTE prophylaxis, progressive ambulation and ADL's and discharge planning. The patient is planning to be discharged home.     Patient's anticipated LOS is less than 2 midnights, meeting these requirements: - Younger than 52 - Lives within 1 hour of care - Has a competent adult at home to recover with post-op recover - NO history of  - Chronic pain requiring opiods  - Diabetes  - Coronary Artery Disease  - Heart failure  - Heart attack  - Stroke  - DVT/VTE  - Cardiac arrhythmia  - Respiratory Failure/COPD  - Renal failure  - Anemia  - Advanced Liver disease  Rosina Patti, PA-C Orthopedic Surgery [facility] [place] [phone]

## 2024-04-01 ENCOUNTER — Encounter (HOSPITAL_COMMUNITY): Payer: Self-pay | Admitting: Orthopedic Surgery

## 2024-04-03 DIAGNOSIS — M6281 Muscle weakness (generalized): Secondary | ICD-10-CM | POA: Diagnosis not present

## 2024-04-03 DIAGNOSIS — M25661 Stiffness of right knee, not elsewhere classified: Secondary | ICD-10-CM | POA: Diagnosis not present

## 2024-04-03 DIAGNOSIS — M25561 Pain in right knee: Secondary | ICD-10-CM | POA: Diagnosis not present

## 2024-04-06 DIAGNOSIS — M25661 Stiffness of right knee, not elsewhere classified: Secondary | ICD-10-CM | POA: Diagnosis not present

## 2024-04-06 DIAGNOSIS — M6281 Muscle weakness (generalized): Secondary | ICD-10-CM | POA: Diagnosis not present

## 2024-04-06 DIAGNOSIS — M25561 Pain in right knee: Secondary | ICD-10-CM | POA: Diagnosis not present

## 2024-04-08 DIAGNOSIS — M6281 Muscle weakness (generalized): Secondary | ICD-10-CM | POA: Diagnosis not present

## 2024-04-08 DIAGNOSIS — M25561 Pain in right knee: Secondary | ICD-10-CM | POA: Diagnosis not present

## 2024-04-08 DIAGNOSIS — M25661 Stiffness of right knee, not elsewhere classified: Secondary | ICD-10-CM | POA: Diagnosis not present

## 2024-04-10 DIAGNOSIS — M25661 Stiffness of right knee, not elsewhere classified: Secondary | ICD-10-CM | POA: Diagnosis not present

## 2024-04-10 DIAGNOSIS — M25561 Pain in right knee: Secondary | ICD-10-CM | POA: Diagnosis not present

## 2024-04-10 DIAGNOSIS — M6281 Muscle weakness (generalized): Secondary | ICD-10-CM | POA: Diagnosis not present

## 2024-04-13 DIAGNOSIS — M25561 Pain in right knee: Secondary | ICD-10-CM | POA: Diagnosis not present

## 2024-04-13 DIAGNOSIS — M6281 Muscle weakness (generalized): Secondary | ICD-10-CM | POA: Diagnosis not present

## 2024-04-13 DIAGNOSIS — M25661 Stiffness of right knee, not elsewhere classified: Secondary | ICD-10-CM | POA: Diagnosis not present

## 2024-04-15 DIAGNOSIS — M25561 Pain in right knee: Secondary | ICD-10-CM | POA: Diagnosis not present

## 2024-04-15 DIAGNOSIS — M25661 Stiffness of right knee, not elsewhere classified: Secondary | ICD-10-CM | POA: Diagnosis not present

## 2024-04-15 DIAGNOSIS — M6281 Muscle weakness (generalized): Secondary | ICD-10-CM | POA: Diagnosis not present

## 2024-04-17 DIAGNOSIS — M25661 Stiffness of right knee, not elsewhere classified: Secondary | ICD-10-CM | POA: Diagnosis not present

## 2024-04-17 DIAGNOSIS — M25561 Pain in right knee: Secondary | ICD-10-CM | POA: Diagnosis not present

## 2024-04-17 DIAGNOSIS — M6281 Muscle weakness (generalized): Secondary | ICD-10-CM | POA: Diagnosis not present

## 2024-04-20 DIAGNOSIS — M6281 Muscle weakness (generalized): Secondary | ICD-10-CM | POA: Diagnosis not present

## 2024-04-20 DIAGNOSIS — M25561 Pain in right knee: Secondary | ICD-10-CM | POA: Diagnosis not present

## 2024-04-20 DIAGNOSIS — M25661 Stiffness of right knee, not elsewhere classified: Secondary | ICD-10-CM | POA: Diagnosis not present

## 2024-04-22 DIAGNOSIS — M25561 Pain in right knee: Secondary | ICD-10-CM | POA: Diagnosis not present

## 2024-04-22 DIAGNOSIS — M6281 Muscle weakness (generalized): Secondary | ICD-10-CM | POA: Diagnosis not present

## 2024-04-22 DIAGNOSIS — M25661 Stiffness of right knee, not elsewhere classified: Secondary | ICD-10-CM | POA: Diagnosis not present

## 2024-04-24 DIAGNOSIS — M25661 Stiffness of right knee, not elsewhere classified: Secondary | ICD-10-CM | POA: Diagnosis not present

## 2024-04-24 DIAGNOSIS — M6281 Muscle weakness (generalized): Secondary | ICD-10-CM | POA: Diagnosis not present

## 2024-04-24 DIAGNOSIS — M25561 Pain in right knee: Secondary | ICD-10-CM | POA: Diagnosis not present

## 2024-04-27 DIAGNOSIS — M25661 Stiffness of right knee, not elsewhere classified: Secondary | ICD-10-CM | POA: Diagnosis not present

## 2024-04-27 DIAGNOSIS — M25561 Pain in right knee: Secondary | ICD-10-CM | POA: Diagnosis not present

## 2024-04-27 DIAGNOSIS — M6281 Muscle weakness (generalized): Secondary | ICD-10-CM | POA: Diagnosis not present

## 2024-04-29 DIAGNOSIS — M6281 Muscle weakness (generalized): Secondary | ICD-10-CM | POA: Diagnosis not present

## 2024-04-29 DIAGNOSIS — M25661 Stiffness of right knee, not elsewhere classified: Secondary | ICD-10-CM | POA: Diagnosis not present

## 2024-04-29 DIAGNOSIS — M25561 Pain in right knee: Secondary | ICD-10-CM | POA: Diagnosis not present

## 2024-05-01 DIAGNOSIS — M25561 Pain in right knee: Secondary | ICD-10-CM | POA: Diagnosis not present

## 2024-05-01 DIAGNOSIS — M25661 Stiffness of right knee, not elsewhere classified: Secondary | ICD-10-CM | POA: Diagnosis not present

## 2024-05-01 DIAGNOSIS — M6281 Muscle weakness (generalized): Secondary | ICD-10-CM | POA: Diagnosis not present

## 2024-05-12 DIAGNOSIS — L821 Other seborrheic keratosis: Secondary | ICD-10-CM | POA: Diagnosis not present

## 2024-05-12 DIAGNOSIS — Z85820 Personal history of malignant melanoma of skin: Secondary | ICD-10-CM | POA: Diagnosis not present

## 2024-05-12 DIAGNOSIS — Z85828 Personal history of other malignant neoplasm of skin: Secondary | ICD-10-CM | POA: Diagnosis not present

## 2024-05-12 DIAGNOSIS — L570 Actinic keratosis: Secondary | ICD-10-CM | POA: Diagnosis not present

## 2024-05-18 DIAGNOSIS — Z471 Aftercare following joint replacement surgery: Secondary | ICD-10-CM | POA: Diagnosis not present

## 2024-05-18 DIAGNOSIS — Z96651 Presence of right artificial knee joint: Secondary | ICD-10-CM | POA: Diagnosis not present

## 2024-05-26 ENCOUNTER — Other Ambulatory Visit

## 2024-05-26 DIAGNOSIS — R972 Elevated prostate specific antigen [PSA]: Secondary | ICD-10-CM | POA: Diagnosis not present

## 2024-05-27 LAB — PSA, TOTAL AND FREE
PSA, Free Pct: 10 %
PSA, Free: 0.61 ng/mL
Prostate Specific Ag, Serum: 6.1 ng/mL — ABNORMAL HIGH (ref 0.0–4.0)

## 2024-06-01 ENCOUNTER — Telehealth: Payer: Self-pay

## 2024-06-01 NOTE — Telephone Encounter (Signed)
 Pt/wife made aware of appointment rescheduled.

## 2024-06-01 NOTE — Progress Notes (Deleted)
 Assessment:   Plan:  Elevated PSA.  His PSA is up again but below the high.  I will repeat it in 6 months and then a year.  If the PSA continues to rise, I will get him set up for a biopsy under anesthesia.  Otherwise he will return in a year.   Anal stenosis.  He will need anesthesia if a biopsy is required.   BPH with BOO.  He is doing well on tamsulosin .  He will continue that.   HPI:   06/06/23: Walter Orozco returns today in f/u.  His PSA is back up to 7.3 with an 8.8% f/t ratio.  He had an acute right knee injury with inflammation prior ot the blood draw.  The PSA is in his usual range and his prior high has been up to 9.  He is voiding well on tamsulosin  with an IPSS of 5 and nocturia x 2. His UA is clear.  He had a CT [facility] on 04/04/23 and had a right renal cyst.  The prostate had no nodules or other GU findings. His labs then were unremarkable.    06/07/22: Walter Orozco returns today in f/u.  His PSA is down slightly to 5.2 with a 6.5% f/t ratio. He remains on tamsulosin .  His IPSS is 6 with nocturia x 2.  He has been released from follow up for his colon cancer.  His UA is clear.    11/30/21: Walter Orozco returns today in f/u.  His PSA was stable at 5.7 with an 8.9% f/t ratio on 11/11/21 and 5.4 with a 9.1% f/t ratio on 05/25/21.  It was 6.1 in 8/22 and 9.0 in 6/22.   He remains on tamsulosin .  His IPSS is 3 with nocturia x 1.   05/25/21.  Walter Orozco returns in f/u.  He didn't get the planned PSA prior to this visit.  He continues to void well on tamsulosin .  His IPSS is 5.    02/16/21: Walter Orozco returns today in f/u.  His PSA on 12/22/20 was 9.0 with a 6.8% f/t ratio.  He had a prostate MRI on 01/11/21 that  showed a 31ml prostate with  PIRADS 1 categorization.  A repeat PSA prior to this visit is down to 6.1 with an 11.0% f/t ratio. His IPSS is 3.  He remains on tamulosin.    12/22/20: Walter Orozco returns for overdue f/u.  He was last seen in 11/19 for a history of an elevated PSA and his PSA then was 4.4.   His PSA had been above 5 in the past.   Since then his PSA has risen slowly and was 4.5 in 8/20, 5.0 in 7/21 and 5.21 on 07/21/20 which was stable.  His UA is unremarkable. He also has a history of colon cancer with bladder invasion that required resection followed by chemo and RTx.  He remains in remission with a low CEA.    He has a colostomy.  He has a history of stones.  He is voiding well and remains on tamsulosin .  He just got the scripts refilled.      ROS:  Review of Systems  All other systems reviewed and are negative.   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   Arthritis    High cholesterol    Hypertension     Past Surgical History:  Procedure Laterality Date   CATARACT EXTRACTION [facility] Left 12/23/2019  Procedure: CATARACT EXTRACTION PHACO AND INTRAOCULAR LENS PLACEMENT LEFT EYE;  Surgeon: Harrie Agent, MD;  Location: [facility] ORS;  Service: Ophthalmology;  Laterality: Left;  CDE: 14.68   CATARACT EXTRACTION W/PHACO Right 01/08/2020   Procedure: CATARACT EXTRACTION PHACO AND INTRAOCULAR LENS PLACEMENT RIGHT EYE;  Surgeon: Harrie Agent, MD;  Location: [facility];  Service: Ophthalmology;  Laterality: Right;  CDE: 18.60   COLON SURGERY     COLONOSCOPY  07/10/2012   Procedure: COLONOSCOPY;  Surgeon: Claudis RAYMOND Rivet, MD;  Location: [facility] SU[facility];  Service: Endoscopy;  Laterality: N/A;  1200   COLONOSCOPY N/A 02/29/2016   Procedure: COLONOSCOPY;  Surgeon: Claudis RAYMOND Rivet, MD;  Location: [facility];  Service: Endoscopy;  Laterality: N/A;  1200 - moved to 9/6 @ 10:30   COLONOSCOPY WITH PROPOFOL  N/A 03/29/2021   Procedure: COLONOSCOPY WITH PROPOFOL ;  Surgeon: Rivet Claudis RAYMOND, MD;  Location: [facility];  Service: Endoscopy;  Laterality: N/A;  12:30   TOTAL HIP ARTHROPLASTY Right 06/02/2019   Procedure: TOTAL HIP ARTHROPLASTY ANTERIOR APPROACH;  Surgeon: Ernie Cough, MD;  Location: [facility];  Service: Orthopedics;   Laterality: Right;  70 mins   TOTAL KNEE ARTHROPLASTY Right 03/31/2024   Procedure: ARTHROPLASTY, KNEE, TOTAL;  Surgeon: Ernie Cough, MD;  Location: [facility];  Service: Orthopedics;  Laterality: Right;    Social History   Socioeconomic History   Marital status: Married    Spouse name: Not on file   Number of children: Not on file   Years of education: Not on file   Highest education level: Not on file  Occupational History   Not on file  Tobacco Use   Smoking status: Former    Current packs/day: 0.00    Types: Cigarettes    Quit date: 01/22/1973    Years since quitting: 51.3   Smokeless tobacco: Former    Types: Chew, Snuff  Vaping Use   Vaping status: Never Used  Substance and Sexual Activity   Alcohol use: Yes    Comment: occasional glass of wine or beer   Drug use: No   Sexual activity: Not on file  Other Topics Concern   Not on file  Social History Narrative   Not on file   Social Drivers of Health   Financial Resource Strain: Not on file  Food Insecurity: Not on file  Transportation Needs: Not on file  Physical Activity: Not on file  Stress: Not on file  Social Connections: Not on file  Intimate Partner Violence: Not on file    Family History  Problem Relation Age of Onset   Cancer Father     Anti-infectives: Anti-infectives (From admission, onward)    None       Current Outpatient Medications  Medication Sig Dispense Refill   amLODipine  (NORVASC ) 10 MG tablet Take 10 mg by mouth daily.     Cholecalciferol (VITAMIN D3) 125 MCG (5000 UT) CAPS Take 5,000 Units by mouth daily.     lisinopril -hydrochlorothiazide (ZESTORETIC) 10-12.5 MG tablet Take 1 tablet by mouth daily.     ondansetron  (ZOFRAN -ODT) 4 MG disintegrating tablet 4mg  ODT q4 hours prn nausea/vomit (Patient not taking: Reported on 03/19/2024) 20 tablet 0   simvastatin  (ZOCOR ) 40 MG tablet Take 40 mg by mouth daily.     tamsulosin  (FLOMAX ) 0.4 MG CAPS capsule Take 1 capsule (0.4 mg total) by  mouth daily. 100 capsule 3   No current facility-administered medications for this visit.   Facility-Administered Medications Ordered in Other Visits  Medication Dose Route Frequency Provider Last Rate Last Admin   neomycin -polymyxin b-dexamethasone  (MAXITROL ) ophthalmic suspension    PRN Harrie Agent, MD   1 drop at 12/23/19 1332     Objective: Vital signs in last 24 hours: There were no vitals taken for this visit.  Intake/Output from previous day: No intake/output data recorded. Intake/Output this shift: @IOTHISSHIFT @   Physical Exam Vitals reviewed.  Constitutional:      Appearance: Normal appearance.  Neurological:     Mental Status: He is alert.     Lab Results:  UA is clear.   Recent Results (from the past 2160 hours)  Basic metabolic panel per protocol     Status: None   Collection Time: 03/23/24 10:27 AM  Result Value Ref Range   Sodium 140 135 - 145 mmol/L   Potassium 3.7 3.5 - 5.1 mmol/L   Chloride 103 98 - 111 mmol/L   CO2 26 22 - 32 mmol/L   Glucose, Bld 99 70 - 99 mg/dL    Comment: Glucose reference range applies only to samples taken after fasting for at least 8 hours.   BUN 10 8 - 23 mg/dL   Creatinine, Ser 9.12 0.61 - 1.24 mg/dL   Calcium 10.0 8.9 - 10.3 mg/dL   GFR, Estimated >39 >39 mL/min    Comment: (NOTE) Calculated using the CKD-EPI Creatinine Equation (2021)    Anion gap 11 5 - 15    Comment: Performed at [facility], [address] [place], [place] [address]  CBC per protocol     Status: None   Collection Time: 03/23/24 10:27 AM  Result Value Ref Range   WBC 8.7 4.0 - 10.5 K/uL   RBC 4.93 4.22 - 5.81 MIL/uL   Hemoglobin 15.5 13.0 - 17.0 g/dL   HCT 54.8 60.9 - 47.9 %   MCV 91.5 80.0 - 100.0 fL   MCH 31.4 26.0 - 34.0 pg   MCHC 34.4 30.0 - 36.0 g/dL   RDW 86.9 88.4 - 84.4 %   Platelets 205 150 - 400 K/uL   nRBC 0.0 0.0 - 0.2 %    Comment: Performed at [facility], [address]  [place], [place] [address]  Surgical pcr screen     Status: None   Collection Time: 03/23/24 10:27 AM   Specimen: Nasal Mucosa; Nasal Swab  Result Value Ref Range   MRSA, PCR NEGATIVE NEGATIVE   Staphylococcus aureus NEGATIVE NEGATIVE    Comment: (NOTE) The Xpert SA Assay (FDA approved for NASAL specimens in patients 66 years of age and older), is one component of a comprehensive surveillance program. It is not intended to diagnose infection nor to guide or monitor treatment. Performed at [facility], [address], [place], [place] [address]   PSA, total and free     Status: Abnormal   Collection Time: 05/26/24  8:30 AM  Result Value Ref Range   Prostate Specific Ag, Serum 6.1 (H) 0.0 - 4.0 ng/mL    Comment: Roche ECLIA methodology. According to the American Urological Association, Serum PSA should decrease and remain at undetectable levels after radical prostatectomy. The AUA defines biochemical recurrence as an initial PSA value 0.2 ng/mL or greater followed by a subsequent confirmatory PSA value 0.2 ng/mL or greater. Values obtained with different assay methods or kits cannot be used interchangeably. Results cannot be interpreted as absolute evidence of the presence or absence of malignant disease.    PSA, Free 0.61 N/A  ng/mL    Comment: Roche ECLIA methodology.   PSA, Free Pct 10.0 %    Comment: The table below lists the probability of prostate cancer for men with non-suspicious DRE results and total PSA between 4 and 10 ng/mL, by patient age Jaycee rosemarie cherry, JAMA 1998, 720:[id number]).                   % Free PSA       50-64 yr        65-75 yr                   0.00-10.00%        56%             55%                  10.01-15.00%        24%             35%                  15.01-20.00%        17%             23%                  20.01-25.00%        10%             20%                       >25.00%         5%              9% Please note:  Catalona et al did not  make specific               recommendations regarding the use of               percent free PSA for any other population               of men.    Lab Results  Component Value Date   PSA1 6.1 (H) 05/26/2024   PSA1 8.0 (H) 12/12/2023   PSA1 7.3 (H) 05/30/2023     UA is clear.      Studies/Results: No results found.

## 2024-06-02 ENCOUNTER — Ambulatory Visit: Admitting: Urology

## 2024-06-04 ENCOUNTER — Other Ambulatory Visit: Payer: Medicare HMO

## 2024-06-09 ENCOUNTER — Telehealth: Payer: Self-pay

## 2024-06-09 NOTE — Telephone Encounter (Signed)
 Tried to call pt with no answer. LVM for return call to office.

## 2024-06-09 NOTE — Telephone Encounter (Signed)
-----   Message from Garnette Shack, MD sent at 06/03/2024 12:54 PM EST ----- Notify patient that his PSA was 6.1, lower.  I think he had to be rescheduled from yesterday.  Make sure he is seen within the next 3 to 4 months. ----- Message ----- From: Gretta Carlos SAUNDERS, CMA Sent: 06/01/2024   1:03 PM EST To: Garnette Shack, MD  Pt rescheduled due to emergency surgery . Pt need PSA results

## 2024-06-11 ENCOUNTER — Ambulatory Visit: Payer: Medicare HMO | Admitting: Urology

## 2024-06-16 DIAGNOSIS — Z933 Colostomy status: Secondary | ICD-10-CM | POA: Diagnosis not present

## 2024-06-16 DIAGNOSIS — Z85038 Personal history of other malignant neoplasm of large intestine: Secondary | ICD-10-CM | POA: Diagnosis not present

## 2024-06-19 ENCOUNTER — Other Ambulatory Visit: Payer: Self-pay | Admitting: Urology

## 2024-06-19 DIAGNOSIS — N138 Other obstructive and reflux uropathy: Secondary | ICD-10-CM

## 2024-07-01 NOTE — Progress Notes (Signed)
 COVID Vaccine received:  []  No [x]  Yes Date of any COVID positive Test in last 90 days:  None  PCP - DOROTHA Christyne Hurst, MD  Cardiologist -   Chest x-ray - 03-26-2023  1v Epic EKG -  03-23-2024 Stress Test -  ECHO -  Cardiac Cath -  CT Coronary Calcium score:   Pacemaker / ICD device [x]  No []  Yes   Spinal Cord Stimulator:[x]  No []  Yes       History of Sleep Apnea? [x]  No []  Yes   CPAP used?- [x]  No []  Yes    Medication on DOS: Amlodipine, Tamsulosin , Eye drops and Tylenol  if needed.   Hold DOS: lisinopril- HCTZ   Patient has: []  NO Hx DM   [x]  Pre-DM   []  DM1  []   DM2 Does the patient monitor blood sugar?   [x]  N/A   []  No []  Yes  Last A1C - 5.7 on 06-28-2023  Pre-DM  no meds  Blood Thinner / Instructions:  none Aspirin  Instructions:  none  Activity level: Able to walk up 2 flights of stairs without becoming significantly short of breath or having chest pain?   [x]    Yes   []  No,  would have:  Patient can perform ADLs without assistance.  [x]   Yes    []  No   Comments: Patient brought Lab results done at his PCP on 06-30-2024  Anesthesia review: HTN, Pre-DM no meds  Patient denies any S&S of respiratory illness or Covid - no shortness of breath, fever, cough or chest pain at PAT appointment.  Patient verbalized understanding and agreement to the Pre-Surgical Instructions that were given to them at this PAT appointment. Patient was also educated of the need to review these PAT instructions again prior to his surgery.I reviewed the appropriate phone numbers to call if they have any and questions or concerns.

## 2024-07-01 NOTE — Patient Instructions (Addendum)
 SURGICAL WAITING ROOM VISITATION Patients having surgery or a procedure may have no more than 2 support people in the waiting area - these visitors may rotate in the visitor waiting room.   If the patient needs to stay at the hospital during part of their recovery, the visitor guidelines for inpatient rooms apply.  PRE-OP VISITATION  Pre-op nurse will coordinate an appropriate time for 1 support person to accompany the patient in pre-op.  This support person may not rotate.  This visitor will be contacted when the time is appropriate for the visitor to come back in the pre-op area.  Please refer to the [facility] website for the visitor guidelines for Inpatients (after your surgery is over and you are in a regular room).  Temporary Visitor Restrictions  Children ages 25 and under will not be able to visit patients in [facility] under most circumstances. Visitation is not restricted outside of hospitals unless noted otherwise in the [facility] and Location Specific Visitation Guidelines at :      [email or web address] Visitors with respiratory illnesses are discouraged from visiting and should remain at home.  You are not required to quarantine at this time prior to your surgery. However, you must do this: Hand Hygiene often Do NOT share personal items Notify your provider if you are in close contact with someone who has COVID or you develop fever 100.4 or greater, new onset of sneezing, cough, sore throat, shortness of breath or body aches.  If you test positive for Covid or have been in contact with anyone that has tested positive in the last 10 days please notify you surgeon.    Your procedure is scheduled on:  TUESDAY  07-14-2024  Report to [facility] Main Entrance: [address] entrance where the [employer] is available.   Report to admitting at: 05:15    AM  Call this number if you have any questions or problems the morning of surgery [phone]  Do not eat food  after Midnight the night prior to your surgery/procedure.  After Midnight you may have the following liquids until   04:15 AM DAY OF SURGERY  Clear Liquid Diet Water  Black Coffee (sugar ok, NO MILK/CREAM OR CREAMERS)  Tea (sugar ok, NO MILK/CREAM OR CREAMERS) regular and decaf                             Plain Jell-O  with no fruit (NO RED)                                           Fruit ices (not with fruit pulp, NO RED)                                     Popsicles (NO RED)                                                                  Juice: NO CITRUS JUICES: only apple, WHITE grape, WHITE cranberry Sports drinks like Gatorade or Powerade (NO RED)  FOLLOW ANY ADDITIONAL PRE OP INSTRUCTIONS YOU RECEIVED FROM YOUR SURGEON'S OFFICE!!!   Oral Hygiene is also important to reduce your risk of infection.        Remember - BRUSH YOUR TEETH THE MORNING OF SURGERY WITH YOUR REGULAR TOOTHPASTE  Do NOT smoke after Midnight the night before surgery.  STOP TAKING all Vitamins, Herbs and supplements 1 week before your surgery.   Take ONLY these medicines the morning of surgery with A SIP OF WATER : Amlodipine, Tamsulosin , and Tylenol  if needed. You may use your Eye drops if needed.   DO NOT TAKE lisinopril- HCTZ the morning of your surgery.                   You may not have any metal on your body including  jewelry, and body piercing  Do not wear  lotions, powders,  cologne, or deodorant  Men may shave face and neck.  Contacts, Hearing Aids, dentures or bridgework may not be worn into surgery. DENTURES WILL BE REMOVED PRIOR TO SURGERY PLEASE DO NOT APPLY Poly grip OR ADHESIVES!!!  Patients discharged on the day of surgery will not be allowed to drive home.  Someone NEEDS to stay with you for the first 24 hours after anesthesia.  Do not bring your home medications to the hospital. The Pharmacy will dispense medications listed on your medication list to you during your  admission in the Hospital.  Please read over the following fact sheets you were given: IF YOU HAVE QUESTIONS ABOUT YOUR PRE-OP INSTRUCTIONS, PLEASE CALL [phone].      Pre-operative 4 CHG Bath Instructions   You can play a key role in reducing the risk of infection after surgery. Your skin needs to be as free of germs as possible. You can reduce the number of germs on your skin by washing with CHG (chlorhexidine  gluconate) soap before surgery. CHG is an antiseptic soap that kills germs and continues to kill germs even after washing.   DO NOT use if you have an allergy to chlorhexidine /CHG or antibacterial soaps. If your skin becomes reddened or irritated, stop using the CHG and notify one of our RNs at [phone]  Please shower with the CHG soap starting 4 days before surgery using the following schedule: FRIDAY  07-10-2024     Do NOT use CHG soap                                                                                                                      the morning of your  surgery.         Please keep in mind the following:  DO NOT shave, including legs and underarms, starting the day of your first shower.   You may shave your face at any point before/day of surgery.  Place clean sheets on your bed the day you start using CHG soap. Use a clean washcloth (not used since being washed) for each shower. DO NOT sleep with pets once you start using the CHG.  CHG Shower Instructions:  If you choose to wash your hair and private area, wash first with your normal shampoo/soap.  After you use shampoo/soap, rinse your hair and body thoroughly to remove shampoo/soap residue.  Turn the water  OFF and apply about 3 tablespoons (45 ml) of CHG soap to a CLEAN washcloth.  Apply CHG soap ONLY FROM YOUR NECK DOWN TO YOUR TOES (washing for 3-5 minutes)  DO NOT use  CHG soap on face, private areas, open wounds, or sores.  Pay special attention to the area where your surgery is being performed.  If you are having back surgery, having someone wash your back for you may be helpful. Wait 2 minutes after CHG soap is applied, then you may rinse off the CHG soap.  Pat dry with a clean towel  Put on clean clothes/pajamas   If you choose to wear lotion, please use ONLY the CHG-compatible lotions on the back of this paper.     Additional instructions for the day of surgery: DO NOT APPLY any CHG Soap,  lotions, deodorants, cologne, or perfumes on the day of surgery  Put on clean/comfortable clothes.  Brush your teeth.  Ask your nurse before applying any prescription medications to the skin.   CHG Compatible Lotions   Aveeno Moisturizing lotion  Cetaphil Moisturizing Cream  Cetaphil Moisturizing Lotion  Clairol Herbal Essence Moisturizing Lotion, Dry Skin  Clairol Herbal Essence Moisturizing Lotion, Extra Dry Skin  Clairol Herbal Essence Moisturizing Lotion, Normal Skin  Curel Age Defying Therapeutic Moisturizing Lotion with Alpha Hydroxy  Curel Extreme Care Body Lotion  Curel Soothing Hands Moisturizing Hand Lotion  Curel Therapeutic Moisturizing Cream, Fragrance-Free  Curel Therapeutic Moisturizing Lotion, Fragrance-Free  Curel Therapeutic Moisturizing Lotion, Original Formula  Eucerin Daily Replenishing Lotion  Eucerin Dry Skin Therapy Plus Alpha Hydroxy Crme  Eucerin Dry Skin Therapy Plus Alpha Hydroxy Lotion  Eucerin Original Crme  Eucerin Original Lotion  Eucerin Plus Crme Eucerin Plus Lotion  Eucerin TriLipid Replenishing Lotion  Keri Anti-Bacterial Hand Lotion  Keri Deep Conditioning Original Lotion Dry Skin Formula Softly Scented  Keri Deep Conditioning Original Lotion, Fragrance Free Sensitive Skin Formula  Keri Lotion Fast Absorbing Fragrance Free Sensitive Skin Formula  Keri Lotion Fast Absorbing Softly Scented Dry Skin Formula  Keri  Original Lotion  Keri Skin Renewal Lotion Keri Silky Smooth Lotion  Keri Silky Smooth Sensitive Skin Lotion  Nivea Body Creamy Conditioning Oil  Nivea Body Extra Enriched Lotion  Nivea Body Original Lotion  Nivea Body Sheer Moisturizing Lotion Nivea Crme  Nivea Skin Firming Lotion  NutraDerm 30 Skin Lotion  NutraDerm Skin Lotion  NutraDerm Therapeutic Skin Cream  NutraDerm Therapeutic Skin Lotion  ProShield Protective Hand Cream  Provon moisturizing lotion   FAILURE TO FOLLOW THESE INSTRUCTIONS MAY RESULT IN THE CANCELLATION OF YOUR SURGERY  PATIENT SIGNATURE_________________________________  NURSE SIGNATURE__________________________________  ________________________________________________________________________        Nasario Exon    An incentive spirometer is a tool that can help keep your lungs clear and active. This tool measures  how well you are filling your lungs with each breath. Taking long deep breaths may help reverse or decrease the chance of developing breathing (pulmonary) problems (especially infection) following: A long period of time when you are unable to move or be active. BEFORE THE PROCEDURE  If the spirometer includes an indicator to show your best effort, your nurse or respiratory therapist will set it to a desired goal. If possible, sit up straight or lean slightly forward. Try not to slouch. Hold the incentive spirometer in an upright position. INSTRUCTIONS FOR USE  Sit on the edge of your bed if possible, or sit up as far as you can in bed or on a chair. Hold the incentive spirometer in an upright position. Breathe out normally. Place the mouthpiece in your mouth and seal your lips tightly around it. Breathe in slowly and as deeply as possible, raising the piston or the ball toward the top of the column. Hold your breath for 3-5 seconds or for as long as possible. Allow the piston or ball to fall to the bottom of the column. Remove the  mouthpiece from your mouth and breathe out normally. Rest for a few seconds and repeat Steps 1 through 7 at least 10 times every 1-2 hours when you are awake. Take your time and take a few normal breaths between deep breaths. The spirometer may include an indicator to show your best effort. Use the indicator as a goal to work toward during each repetition. After each set of 10 deep breaths, practice coughing to be sure your lungs are clear. If you have an incision (the cut made at the time of surgery), support your incision when coughing by placing a pillow or rolled up towels firmly against it. Once you are able to get out of bed, walk around indoors and cough well. You may stop using the incentive spirometer when instructed by your caregiver.  RISKS AND COMPLICATIONS Take your time so you do not get dizzy or light-headed. If you are in pain, you may need to take or ask for pain medication before doing incentive spirometry. It is harder to take a deep breath if you are having pain. AFTER USE Rest and breathe slowly and easily. It can be helpful to keep track of a log of your progress. Your caregiver can provide you with a simple table to help with this. If you are using the spirometer at home, follow these instructions: SEEK MEDICAL CARE IF:  You are having difficultly using the spirometer. You have trouble using the spirometer as often as instructed. Your pain medication is not giving enough relief while using the spirometer. You develop fever of 100.5 F (38.1 C) or higher.                                                                                                    SEEK IMMEDIATE MEDICAL CARE IF:  You cough up bloody sputum that had not been present before. You develop fever of 102 F (38.9 C) or greater. You develop worsening pain at or near the incision site. MAKE  SURE YOU:  Understand these instructions. Will watch your condition. Will get help right away if you are not doing  well or get worse. Document Released: 10/22/2006 Document Revised: 09/03/2011 Document Reviewed: 12/23/2006 [facility] Patient Information 2014 [place], [place].

## 2024-07-02 ENCOUNTER — Other Ambulatory Visit: Payer: Self-pay

## 2024-07-02 ENCOUNTER — Encounter (HOSPITAL_COMMUNITY)
Admission: RE | Admit: 2024-07-02 | Discharge: 2024-07-02 | Disposition: A | Discharge status: Home / Self Care | Source: Ambulatory Visit | Attending: Orthopedic Surgery | Admitting: Orthopedic Surgery

## 2024-07-02 ENCOUNTER — Encounter (HOSPITAL_COMMUNITY): Payer: Self-pay

## 2024-07-02 VITALS — BP 106/65 | HR 86 | Temp 98.4°F | Resp 20 | Ht 64.0 in | Wt 159.4 lb

## 2024-07-02 DIAGNOSIS — Z01812 Encounter for preprocedural laboratory examination: Secondary | ICD-10-CM | POA: Diagnosis present

## 2024-07-02 DIAGNOSIS — R7303 Prediabetes: Secondary | ICD-10-CM | POA: Diagnosis not present

## 2024-07-02 DIAGNOSIS — M1712 Unilateral primary osteoarthritis, left knee: Secondary | ICD-10-CM | POA: Diagnosis not present

## 2024-07-02 DIAGNOSIS — Z01818 Encounter for other preprocedural examination: Secondary | ICD-10-CM

## 2024-07-02 HISTORY — DX: Prediabetes: R73.03

## 2024-07-02 HISTORY — DX: Myoneural disorder, unspecified: G70.9

## 2024-07-02 HISTORY — DX: Personal history of urinary calculi: Z87.442

## 2024-07-02 LAB — SURGICAL PCR SCREEN
MRSA, PCR: NEGATIVE
Staphylococcus aureus: NEGATIVE

## 2024-07-06 ENCOUNTER — Telehealth: Payer: Self-pay

## 2024-07-06 NOTE — Telephone Encounter (Signed)
 Tried calling pt with no answer. Lvm for return call. Medication refill sent to Dr. Watt.  Ok to refill but if this is a patient who is still being seen in [facility] it should be handled by one of the providers there.   I no longer have prescribing privileges for the [facility] patients.   If he is coming [facility], the refill request needs to be sent to the office in [place].   Thanks.

## 2024-07-12 ENCOUNTER — Encounter (HOSPITAL_COMMUNITY): Payer: Self-pay | Admitting: Orthopedic Surgery

## 2024-07-12 NOTE — Anesthesia Preprocedure Evaluation (Signed)
 "                                  Anesthesia Evaluation  Patient identified by MRN, date of birth, ID band Patient awake    Reviewed: Allergy & Precautions, NPO status , Patient's Chart, lab work & pertinent test results  History of Anesthesia Complications Negative for: history of anesthetic complications  Airway Mallampati: III  TM Distance: >3 FB Neck ROM: Full    Dental  (+) Dental Advisory Given, Teeth Intact   Pulmonary neg COPD, former smoker, neg PE   breath sounds clear to auscultation       Cardiovascular hypertension, Pt. on medications (-) CAD, (-) Past MI, (-) Cardiac Stents and (-) CABG  Rhythm:Regular  EKG 02/2924 NSR, Normal    Neuro/Psych neg Seizures Peripheral neuropathy-ChemoRx induced  Neuromuscular disease  negative psych ROS   GI/Hepatic negative GI ROS, Neg liver ROS,neg GERD  ,,(+) neg Cirrhosis      Hx/o Colon Ca S/P partial colectomy ChemoRx   Endo/Other  negative endocrine ROS  HLD   Renal/GU Renal diseaseHx/o Renal Calculi Lab Results      Component                Value               Date                      NA                       140                 03/23/2024                CL                       103                 03/23/2024                K                        3.7                 03/23/2024                CO2                      26                  03/23/2024                BUN                      10                  03/23/2024                CREATININE               0.87                03/23/2024                GFRNONAA                 >  60                 03/23/2024                CALCIUM                  10.0                03/23/2024                ALBUMIN                  3.9                 04/04/2023                GLUCOSE                  99                  03/23/2024             negative genitourinary   Musculoskeletal  (+) Arthritis , Osteoarthritis,  OA left knee   Abdominal   Peds   Hematology negative hematology ROS (+) Lab Results      Component                Value               Date                      WBC                      8.7                 03/23/2024                HGB                      15.5                03/23/2024                HCT                      45.1                03/23/2024                MCV                      91.5                03/23/2024                PLT                      205                 03/23/2024              Anesthesia Other Findings   Reproductive/Obstetrics                              Anesthesia Physical Anesthesia Plan  ASA: 2  Anesthesia Plan: Spinal   Post-op Pain Management: Regional block*   Induction: Intravenous  PONV Risk  Score and Plan: 2 and Ondansetron , Dexamethasone , Treatment may vary due to age or medical condition and Propofol  infusion  Airway Management Planned: Natural Airway and Simple Face Mask  Additional Equipment: None  Intra-op Plan:   Post-operative Plan:   Informed Consent: I have reviewed the patients History and Physical, chart, labs and discussed the procedure including the risks, benefits and alternatives for the proposed anesthesia with the patient or authorized representative who has indicated his/her understanding and acceptance.     Dental advisory given  Plan Discussed with: CRNA  Anesthesia Plan Comments:          Anesthesia Quick Evaluation  "

## 2024-07-14 ENCOUNTER — Encounter (HOSPITAL_COMMUNITY): Payer: Self-pay | Admitting: Orthopedic Surgery

## 2024-07-14 ENCOUNTER — Encounter (HOSPITAL_COMMUNITY)
Admission: RE | Disposition: A | Discharge status: Home / Self Care | Payer: Self-pay | Source: Ambulatory Visit | Attending: Orthopedic Surgery

## 2024-07-14 ENCOUNTER — Ambulatory Visit (HOSPITAL_COMMUNITY)
Admission: RE | Admit: 2024-07-14 | Discharge: 2024-07-14 | Disposition: A | Discharge status: Home / Self Care | Source: Ambulatory Visit | Attending: Orthopedic Surgery | Admitting: Orthopedic Surgery

## 2024-07-14 ENCOUNTER — Ambulatory Visit (HOSPITAL_COMMUNITY): Payer: Self-pay | Admitting: Anesthesiology

## 2024-07-14 ENCOUNTER — Encounter (HOSPITAL_COMMUNITY): Payer: Self-pay | Admitting: Anesthesiology

## 2024-07-14 DIAGNOSIS — M65962 Unspecified synovitis and tenosynovitis, left lower leg: Secondary | ICD-10-CM | POA: Diagnosis not present

## 2024-07-14 DIAGNOSIS — Z9049 Acquired absence of other specified parts of digestive tract: Secondary | ICD-10-CM | POA: Insufficient documentation

## 2024-07-14 DIAGNOSIS — Z87891 Personal history of nicotine dependence: Secondary | ICD-10-CM | POA: Insufficient documentation

## 2024-07-14 DIAGNOSIS — M1990 Unspecified osteoarthritis, unspecified site: Secondary | ICD-10-CM | POA: Diagnosis not present

## 2024-07-14 DIAGNOSIS — Z96652 Presence of left artificial knee joint: Secondary | ICD-10-CM

## 2024-07-14 DIAGNOSIS — M25462 Effusion, left knee: Secondary | ICD-10-CM | POA: Diagnosis not present

## 2024-07-14 DIAGNOSIS — M1712 Unilateral primary osteoarthritis, left knee: Secondary | ICD-10-CM

## 2024-07-14 DIAGNOSIS — I10 Essential (primary) hypertension: Secondary | ICD-10-CM | POA: Insufficient documentation

## 2024-07-14 DIAGNOSIS — E785 Hyperlipidemia, unspecified: Secondary | ICD-10-CM | POA: Diagnosis not present

## 2024-07-14 DIAGNOSIS — T451X5S Adverse effect of antineoplastic and immunosuppressive drugs, sequela: Secondary | ICD-10-CM | POA: Insufficient documentation

## 2024-07-14 DIAGNOSIS — G620 Drug-induced polyneuropathy: Secondary | ICD-10-CM | POA: Insufficient documentation

## 2024-07-14 DIAGNOSIS — Z85038 Personal history of other malignant neoplasm of large intestine: Secondary | ICD-10-CM | POA: Insufficient documentation

## 2024-07-14 HISTORY — PX: TOTAL KNEE ARTHROPLASTY: SHX125

## 2024-07-14 MED ORDER — DEXAMETHASONE SOD PHOSPHATE PF 10 MG/ML IJ SOLN
INTRAMUSCULAR | Status: AC
  Filled 2024-07-14: qty 1

## 2024-07-14 MED ORDER — FENTANYL CITRATE (PF) 50 MCG/ML IJ SOSY: 25.0000 ug | PREFILLED_SYRINGE | INTRAMUSCULAR | Status: DC | PRN

## 2024-07-14 MED ORDER — TRAMADOL HCL 50 MG PO TABS
ORAL_TABLET | ORAL | Status: AC
  Filled 2024-07-14: qty 2

## 2024-07-14 MED ORDER — METOCLOPRAMIDE HCL 5 MG PO TABS: 5.0000 mg | ORAL_TABLET | Freq: Three times a day (TID) | ORAL | Status: DC | PRN

## 2024-07-14 MED ORDER — PROPOFOL 1000 MG/100ML IV EMUL
INTRAVENOUS | Status: AC
  Filled 2024-07-14: qty 100

## 2024-07-14 MED ORDER — TRAMADOL HCL 50 MG PO TABS
50.0000 mg | ORAL_TABLET | Freq: Four times a day (QID) | ORAL | Status: DC | PRN
  Administered 2024-07-14: 100 mg via ORAL

## 2024-07-14 MED ORDER — TAMSULOSIN HCL 0.4 MG PO CAPS: 0.4000 mg | ORAL_CAPSULE | Freq: Every day | ORAL | Status: DC

## 2024-07-14 MED ORDER — ASPIRIN 81 MG PO CHEW: 81.0000 mg | CHEWABLE_TABLET | Freq: Two times a day (BID) | ORAL | 0 refills | Status: AC

## 2024-07-14 MED ORDER — METOCLOPRAMIDE HCL 5 MG/ML IJ SOLN: 5.0000 mg | Freq: Three times a day (TID) | INTRAMUSCULAR | Status: DC | PRN

## 2024-07-14 MED ORDER — LACTATED RINGERS IV BOLUS
500.0000 mL | Freq: Once | INTRAVENOUS | Status: AC
  Administered 2024-07-14: 500 mL via INTRAVENOUS

## 2024-07-14 MED ORDER — SODIUM CHLORIDE 0.9 % IR SOLN
Status: DC | PRN
  Administered 2024-07-14: 1000 mL

## 2024-07-14 MED ORDER — CEFAZOLIN SODIUM-DEXTROSE 2-4 GM/100ML-% IV SOLN
2.0000 g | Freq: Four times a day (QID) | INTRAVENOUS | Status: DC
  Administered 2024-07-14: 2 g via INTRAVENOUS

## 2024-07-14 MED ORDER — CELECOXIB 200 MG PO CAPS: 200.0000 mg | ORAL_CAPSULE | Freq: Two times a day (BID) | ORAL | 0 refills | Status: AC

## 2024-07-14 MED ORDER — TRANEXAMIC ACID-NACL 1000-0.7 MG/100ML-% IV SOLN
INTRAVENOUS | Status: AC
  Filled 2024-07-14: qty 100

## 2024-07-14 MED ORDER — ONDANSETRON HCL 4 MG/2ML IJ SOLN: 4.0000 mg | Freq: Four times a day (QID) | INTRAMUSCULAR | Status: DC | PRN

## 2024-07-14 MED ORDER — HYDROMORPHONE HCL 1 MG/ML IJ SOLN: 0.5000 mg | INTRAMUSCULAR | Status: DC | PRN

## 2024-07-14 MED ORDER — STERILE WATER FOR IRRIGATION IR SOLN
Status: DC | PRN
  Administered 2024-07-14: 2000 mL

## 2024-07-14 MED ORDER — BUPIVACAINE-EPINEPHRINE (PF) 0.5% -1:200000 IJ SOLN
INTRAMUSCULAR | Status: DC | PRN
  Administered 2024-07-14: 20 mL via PERINEURAL

## 2024-07-14 MED ORDER — POVIDONE-IODINE 10 % EX SWAB
2.0000 | Freq: Once | CUTANEOUS | Status: AC
  Administered 2024-07-14: 2 via TOPICAL

## 2024-07-14 MED ORDER — TRAMADOL HCL 50 MG PO TABS: 50.0000 mg | ORAL_TABLET | Freq: Four times a day (QID) | ORAL | 0 refills | Status: AC | PRN

## 2024-07-14 MED ORDER — SODIUM CHLORIDE (PF) 0.9 % IJ SOLN
INTRAMUSCULAR | Status: DC | PRN
  Administered 2024-07-14: 61 mL

## 2024-07-14 MED ORDER — ORAL CARE MOUTH RINSE: 15.0000 mL | Freq: Once | OROMUCOSAL | Status: AC

## 2024-07-14 MED ORDER — TRANEXAMIC ACID-NACL 1000-0.7 MG/100ML-% IV SOLN
1000.0000 mg | INTRAVENOUS | Status: AC
  Administered 2024-07-14: 1000 mg via INTRAVENOUS
  Filled 2024-07-14: qty 100

## 2024-07-14 MED ORDER — POLYETHYLENE GLYCOL 3350 17 G PO PACK: 17.0000 g | PACK | Freq: Two times a day (BID) | ORAL | Status: AC

## 2024-07-14 MED ORDER — PROPOFOL 500 MG/50ML IV EMUL
INTRAVENOUS | Status: DC | PRN
  Administered 2024-07-14: 40 ug/kg/min via INTRAVENOUS
  Administered 2024-07-14 (×2): 20 mg via INTRAVENOUS

## 2024-07-14 MED ORDER — EPHEDRINE SULFATE-NACL 50-0.9 MG/10ML-% IV SOSY
PREFILLED_SYRINGE | INTRAVENOUS | Status: DC | PRN
  Administered 2024-07-14 (×2): 5 mg via INTRAVENOUS

## 2024-07-14 MED ORDER — ACETAMINOPHEN 500 MG PO TABS: 1000.0000 mg | ORAL_TABLET | Freq: Four times a day (QID) | ORAL | Status: DC

## 2024-07-14 MED ORDER — ONDANSETRON HCL 4 MG/2ML IJ SOLN
INTRAMUSCULAR | Status: AC
  Filled 2024-07-14: qty 2

## 2024-07-14 MED ORDER — MIDAZOLAM HCL 2 MG/2ML IJ SOLN
INTRAMUSCULAR | Status: AC
  Filled 2024-07-14: qty 2

## 2024-07-14 MED ORDER — TAMSULOSIN HCL 0.4 MG PO CAPS
0.4000 mg | ORAL_CAPSULE | Freq: Every day | ORAL | Status: DC
  Administered 2024-07-14: 0.4 mg via ORAL
  Filled 2024-07-14: qty 1

## 2024-07-14 MED ORDER — ONDANSETRON HCL 4 MG/2ML IJ SOLN
INTRAMUSCULAR | Status: DC | PRN
  Administered 2024-07-14: 4 mg via INTRAVENOUS

## 2024-07-14 MED ORDER — KETOROLAC TROMETHAMINE 30 MG/ML IJ SOLN
INTRAMUSCULAR | Status: AC
  Filled 2024-07-14: qty 1

## 2024-07-14 MED ORDER — BUPIVACAINE-EPINEPHRINE (PF) 0.25% -1:200000 IJ SOLN
INTRAMUSCULAR | Status: AC
  Filled 2024-07-14: qty 30

## 2024-07-14 MED ORDER — DEXAMETHASONE SOD PHOSPHATE PF 10 MG/ML IJ SOLN
8.0000 mg | Freq: Once | INTRAMUSCULAR | Status: AC
  Administered 2024-07-14: 8 mg via INTRAVENOUS

## 2024-07-14 MED ORDER — ACETAMINOPHEN 10 MG/ML IV SOLN
INTRAVENOUS | Status: AC
  Filled 2024-07-14: qty 100

## 2024-07-14 MED ORDER — METHOCARBAMOL 1000 MG/10ML IJ SOLN: 500.0000 mg | Freq: Four times a day (QID) | INTRAMUSCULAR | Status: DC | PRN

## 2024-07-14 MED ORDER — EPHEDRINE 5 MG/ML INJ
INTRAVENOUS | Status: AC
  Filled 2024-07-14: qty 5

## 2024-07-14 MED ORDER — LACTATED RINGERS IV SOLN: INTRAVENOUS | Status: DC

## 2024-07-14 MED ORDER — CHLORHEXIDINE GLUCONATE 0.12 % MT SOLN
15.0000 mL | Freq: Once | OROMUCOSAL | Status: AC
  Administered 2024-07-14: 15 mL via OROMUCOSAL

## 2024-07-14 MED ORDER — METHOCARBAMOL 500 MG PO TABS: 500.0000 mg | ORAL_TABLET | Freq: Four times a day (QID) | ORAL | Status: DC | PRN

## 2024-07-14 MED ORDER — FENTANYL CITRATE (PF) 100 MCG/2ML IJ SOLN
INTRAMUSCULAR | Status: DC | PRN
  Administered 2024-07-14: 50 ug via INTRAVENOUS

## 2024-07-14 MED ORDER — SENNA 8.6 MG PO TABS: 1.0000 | ORAL_TABLET | Freq: Every day | ORAL | 0 refills | Status: AC

## 2024-07-14 MED ORDER — CEFAZOLIN SODIUM-DEXTROSE 2-4 GM/100ML-% IV SOLN
2.0000 g | INTRAVENOUS | Status: AC
  Administered 2024-07-14: 2 g via INTRAVENOUS
  Filled 2024-07-14: qty 100

## 2024-07-14 MED ORDER — CEFAZOLIN SODIUM-DEXTROSE 2-4 GM/100ML-% IV SOLN
INTRAVENOUS | Status: AC
  Filled 2024-07-14: qty 100

## 2024-07-14 MED ORDER — METHOCARBAMOL 500 MG PO TABS: 500.0000 mg | ORAL_TABLET | Freq: Four times a day (QID) | ORAL | 1 refills | Status: AC | PRN

## 2024-07-14 MED ORDER — 0.9 % SODIUM CHLORIDE (POUR BTL) OPTIME
TOPICAL | Status: DC | PRN
  Administered 2024-07-14: 1000 mL

## 2024-07-14 MED ORDER — FENTANYL CITRATE (PF) 100 MCG/2ML IJ SOLN
INTRAMUSCULAR | Status: AC
  Filled 2024-07-14: qty 2

## 2024-07-14 MED ORDER — TRANEXAMIC ACID-NACL 1000-0.7 MG/100ML-% IV SOLN
1000.0000 mg | Freq: Once | INTRAVENOUS | Status: AC
  Administered 2024-07-14: 1000 mg via INTRAVENOUS

## 2024-07-14 MED ORDER — SODIUM CHLORIDE (PF) 0.9 % IJ SOLN
INTRAMUSCULAR | Status: AC
  Filled 2024-07-14: qty 30

## 2024-07-14 MED ORDER — ONDANSETRON HCL 4 MG PO TABS: 4.0000 mg | ORAL_TABLET | Freq: Four times a day (QID) | ORAL | Status: DC | PRN

## 2024-07-14 MED ORDER — MIDAZOLAM HCL 5 MG/5ML IJ SOLN
INTRAMUSCULAR | Status: DC | PRN
  Administered 2024-07-14: 2 mg via INTRAVENOUS

## 2024-07-14 MED ORDER — ACETAMINOPHEN 10 MG/ML IV SOLN: 1000.0000 mg | Freq: Once | INTRAVENOUS | Status: DC | PRN

## 2024-07-14 MED ORDER — PHENYLEPHRINE HCL-NACL 20-0.9 MG/250ML-% IV SOLN
INTRAVENOUS | Status: DC | PRN
  Administered 2024-07-14: 25 ug/min via INTRAVENOUS

## 2024-07-14 MED ORDER — BUPIVACAINE IN DEXTROSE 0.75-8.25 % IT SOLN
INTRATHECAL | Status: DC | PRN
  Administered 2024-07-14: 1.6 mL via INTRATHECAL

## 2024-07-14 NOTE — Transfer of Care (Signed)
 Immediate Anesthesia Transfer of Care Note  Patient: Walter Orozco  Procedure(s) Performed: ARTHROPLASTY, KNEE, TOTAL (Left: Knee)  Patient Location: PACU  Anesthesia Type:MAC and Spinal  Level of Consciousness: awake, alert , oriented, and patient cooperative  Airway & Oxygen Therapy: Patient Spontanous Breathing and Patient connected to face mask oxygen  Post-op Assessment: Report given to RN and Post -op Vital signs reviewed and stable  Post vital signs: Reviewed and stable  Last Vitals:  Vitals Value Taken Time  BP 98/57 07/14/24 08:50  Temp    Pulse 90 07/14/24 08:51  Resp 17 07/14/24 08:51  SpO2 100 % 07/14/24 08:51  Vitals shown include unfiled device data.  Last Pain:  Vitals:   07/14/24 0553  TempSrc: Oral  PainSc:          Complications: No notable events documented.

## 2024-07-14 NOTE — Evaluation (Signed)
 " Physical Therapy Evaluation Patient Details Name: Walter Orozco MRN: [id number] DOB: [birth year] Today's Date: 07/14/2024  History of Present Illness  79 yo male presents to therapy s/p L TKA on 07/14/2024 due to failure of conservative measures. Pt PMH includes but is not limited to: R THA, AA (2020), arthritis, adenocarcinoma of sigmoid colon, HTN, HLD and R TKA on 03/31/2024.  Clinical Impression       Walter Orozco is a 79 y.o. male POD 0 s/p L TKA. Patient reports mod I with mobility at baseline. Patient is now limited by functional impairments (see PT problem list below) and requires CGA for transfers and gait with RW. Patient was able to ambulate 60 feet with RW and CGA and cues for safe walker management. Patient educated on safe sequencing for stair mobility with use of R handrail and HHA vs SPC, fall risk prevention, pain management and goal, use of CP/ice and car transfers pt and spouse verbalized understanding of safe guarding position for people assisting with mobility. Patient instructed in exercises to facilitate ROM and circulation reviewed and HO provided. Patient will benefit from continued skilled PT interventions to address impairments and progress towards PLOF. Patient has met mobility goals at adequate level for discharge home with family and social support with OPPT scheduled for 1/23; will continue to follow if pt continues acute stay to progress towards Mod I goals.     If plan is discharge home, recommend the following: A little help with walking and/or transfers;A little help with bathing/dressing/bathroom;Assistance with cooking/housework;Assist for transportation;Help with stairs or ramp for entrance   Can travel by private vehicle        Equipment Recommendations None recommended by PT  Recommendations for Other Services       Functional Status Assessment Patient has had a recent decline in their functional status and demonstrates the ability to make significant  improvements in function in a reasonable and predictable amount of time.     Precautions / Restrictions Precautions Precautions: Knee;Fall Restrictions Weight Bearing Restrictions Per Provider Order: No      Mobility  Bed Mobility Overal bed mobility: Needs Assistance Bed Mobility: Supine to Sit     Supine to sit: Supervision     General bed mobility comments: min cues    Transfers Overall transfer level: Needs assistance Equipment used: Rolling walker (2 wheels) Transfers: Sit to/from Stand Sit to Stand: Contact guard assist           General transfer comment: min cues    Ambulation/Gait Ambulation/Gait assistance: Contact guard assist Gait Distance (Feet): 60 Feet Assistive device: Rolling walker (2 wheels) Gait Pattern/deviations: Step-to pattern, Decreased stance time - left, Trunk flexed, Antalgic Gait velocity: decreased     General Gait Details: slight trunk flexion with B UE support at RW to offload L LE in stance phase, min cues for RW management  Stairs Stairs: Yes Stairs assistance: Contact guard assist Stair Management: Two rails, One rail Right Number of Stairs: 3 General stair comments: step navigation initiated with B handrails with CGA and cues for safety, sequencing and step to technique pt able to progress to HHA with R handrail and also has option of use of SPC to navigate steps in home setting  Wheelchair Mobility     Tilt Bed    Modified Rankin (Stroke Patients Only)       Balance Overall balance assessment: Needs assistance Sitting-balance support: Feet supported Sitting balance-Leahy Scale: Good     Standing balance  support: Bilateral upper extremity supported, During functional activity, Reliant on assistive device for balance Standing balance-Leahy Scale: Poor                               Pertinent Vitals/Pain Pain Assessment Pain Assessment: Faces Pain Score: 5  Pain Location: L knee and LE Pain  Descriptors / Indicators: Aching, Constant, Discomfort, Grimacing, Operative site guarding Pain Intervention(s): Limited activity within patient's tolerance, Monitored during session, Premedicated before session, Ice applied    Home Living Family/patient expects to be discharged to:: Private residence Living Arrangements: Spouse/significant other Available Help at Discharge: Family Type of Home: House Home Access: Stairs to enter Entrance Stairs-Rails: Right Entrance Stairs-Number of Steps: 3   Home Layout: One level Home Equipment: Agricultural Consultant (2 wheels);Cane - quad;Shower seat;Tub bench Additional Comments: iceman machine    Prior Function Prior Level of Function : Independent/Modified Independent             Mobility Comments: mod I with SPC for all ADLs, self care tasks and IADLs       Extremity/Trunk Assessment        Lower Extremity Assessment Lower Extremity Assessment: LLE deficits/detail LLE Deficits / Details: ankle DF/PF 5/5; SLR < 10 degree lag LLE Sensation: history of peripheral neuropathy    Cervical / Trunk Assessment Cervical / Trunk Assessment: Normal  Communication   Communication Communication: No apparent difficulties    Cognition Arousal: Alert Behavior During Therapy: WFL for tasks assessed/performed   PT - Cognitive impairments: No apparent impairments                         Following commands: Intact       Cueing       General Comments      Exercises Total Joint Exercises Ankle Circles/Pumps: AROM, Both, 15 reps Quad Sets: AROM, Left, 5 reps Short Arc Quad: AROM, Left, 5 reps Heel Slides: AROM, Left, 5 reps Hip ABduction/ADduction: AROM, Left, 5 reps Straight Leg Raises: AROM, Left, 10 reps Knee Flexion: AROM, Left, 5 reps, Seated   Assessment/Plan    PT Assessment Patient needs continued PT services  PT Problem List Decreased strength;Decreased range of motion;Decreased activity tolerance;Decreased  balance;Decreased mobility;Decreased coordination;Pain       PT Treatment Interventions DME instruction;Gait training;Stair training;Functional mobility training;Therapeutic activities;Therapeutic exercise;Balance training;Neuromuscular re-education;Patient/family education;Modalities    PT Goals (Current goals can be found in the Care Plan section)  Acute Rehab PT Goals Patient Stated Goal: to be able to walk around no pain and AD, be able to get back to doing yard work and wash the car/truck PT Goal Formulation: With patient Time For Goal Achievement: 07/28/24 Potential to Achieve Goals: Good    Frequency 7X/week     Co-evaluation               AM-PAC PT 6 Clicks Mobility  Outcome Measure Help needed turning from your back to your side while in a flat bed without using bedrails?: None Help needed moving from lying on your back to sitting on the side of a flat bed without using bedrails?: A Little Help needed moving to and from a bed to a chair (including a wheelchair)?: A Little Help needed standing up from a chair using your arms (e.g., wheelchair or bedside chair)?: A Little Help needed to walk in hospital room?: A Little Help needed climbing 3-5 steps with a railing? :  A Little 6 Click Score: 19    End of Session Equipment Utilized During Treatment: Gait belt Activity Tolerance: Patient tolerated treatment well;No increased pain Patient left: in chair;with call bell/phone within reach;with nursing/sitter in room Nurse Communication: Mobility status;Other (comment) (pt readiness for same day d/c from PT standpoint) PT Visit Diagnosis: Unsteadiness on feet (R26.81);Other abnormalities of gait and mobility (R26.89);Muscle weakness (generalized) (M62.81);Difficulty in walking, not elsewhere classified (R26.2);Pain Pain - Right/Left: Left Pain - part of body: Knee;Leg    Time: 8860-8775 PT Time Calculation (min) (ACUTE ONLY): 45 min   Charges:   PT Evaluation $PT  Eval Low Complexity: 1 Low PT Treatments $Gait Training: 8-22 mins $Therapeutic Exercise: 8-22 mins PT General Charges $$ ACUTE PT VISIT: 1 Visit         [place], PT Acute Rehab   [place] VEAR Drone 07/14/2024, 1:10 PM "

## 2024-07-14 NOTE — Discharge Instructions (Signed)

## 2024-07-14 NOTE — Care Plan (Signed)
 Ortho Bundle Case Management Note  Patient Details  Name: Walter Orozco MRN: [id number] Date of Birth: 11-19-1945  L TKA on 07/14/24  DCP: Home with wife  DME: No needs. Has RW and cane  PT: [facility]                   DME Arranged:  N/A DME Agency:  NA  HH Arranged:    HH Agency:     Additional Comments: Please contact me with any questions of if this plan should need to change.  Lyle Pepper, CCM [facility] [phone]  Ext. [phone]   07/14/2024, 8:56 AM

## 2024-07-14 NOTE — Anesthesia Procedure Notes (Addendum)
 Anesthesia Regional Block: Adductor canal block   Pre-Anesthetic Checklist: , timeout performed,  Correct Patient, Correct Site, Correct Laterality,  Correct Procedure, Correct Position, site marked,  Risks and benefits discussed,  Surgical consent,  Pre-op evaluation,  At surgeon's request and post-op pain management  Laterality: Lower  Prep: chloraprep       Needles:  Injection technique: Single-shot      Needle Length: 9cm  Needle Gauge: 22     Additional Needles: Arrow StimuQuik ECHO Echogenic Stimulating PNB Needle  Procedures:,,,, ultrasound used (permanent image in chart),,    Narrative:  Start time: 07/14/2024 6:58 AM End time: 07/14/2024 7:04 AM Injection made incrementally with aspirations every 5 mL.  Performed by: Personally  Anesthesiologist: Leopoldo Bruckner, MD

## 2024-07-14 NOTE — Anesthesia Procedure Notes (Signed)
 Spinal  Patient location during procedure: OR Start time: 07/14/2024 7:19 AM End time: 07/14/2024 7:23 AM Reason for block: surgical anesthesia  Staffing Performed: anesthesiologist  Authorized by: Leopoldo Bruckner, MD   Performed by: Leopoldo Bruckner, MD  Preanesthetic Checklist Completed: patient identified, IV checked, risks and benefits discussed, surgical consent, monitors and equipment checked, pre-op evaluation and timeout performed Spinal Block Patient position: sitting Prep: ChloraPrep Patient monitoring: heart rate, cardiac monitor, continuous pulse ox and blood pressure Approach: midline Location: L3-4 Injection technique: single-shot Needle Needle type: Pencan  Needle gauge: 24 G Needle length: 9 cm Assessment Events: CSF return

## 2024-07-14 NOTE — H&P (Signed)
 TOTAL KNEE ADMISSION H&P  Patient is being admitted for left total knee arthroplasty.  Therapy Plans: outpatient therapy at [facility]  Disposition: Home with wife Planned DVT Prophylaxis: aspirin  81mg  BID DME needed: none PCP: Dr. Zach Hall - clearance received (new appointment on 1/6) TXA: IV Allergies: iron Anesthesia Concerns: none BMI: 26.8 Last HgbA1c: Not diabetic  Other: - celebrex , methocarbamol , oxycodone , tramadol  (itching with oxycodone ) - recent right TKA, doing well - SDD  Subjective:  Chief Complaint: Left knee pain.  HPI: Walter Orozco, 79 y.o. male has a history of pain and functional disability in the left knee due to osteoarthritis and has failed non-surgical conservative treatments for greater than 12 weeks to include NSAID's and/or analgesics, corticosteriod injections, viscosupplementation injections, and activity modification. Onset of symptoms was gradual, starting 3 years ago with gradually worsening course since that time. The patient noted no past surgery on the left knee.  Patient currently rates pain in the left knee at 8 out of 10 with activity. Patient has night pain, worsening of pain with activity and weight bearing, and pain that interferes with activities of daily living. Patient has evidence of joint space narrowing by imaging studies. There is no active infection.  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   S/P right THA, AA 06/02/2019   Osteoarthritis of right hip 06/02/2019   Adenocarcinoma of sigmoid colon (HCC) 06/11/2011    Past Medical History:  Diagnosis Date   Adenocarcinoma of sigmoid colon (HCC) 06/11/2011   Arthritis    High cholesterol    History of kidney stones    Hypertension    Neuromuscular disorder (HCC)    chemo neuropathy in feet   Pre-diabetes     Past Surgical History:  Procedure Laterality Date   CATARACT EXTRACTION W/PHACO Left 12/23/2019    Procedure: CATARACT EXTRACTION PHACO AND INTRAOCULAR LENS PLACEMENT LEFT EYE;  Surgeon: Harrie Agent, MD;  Location: [facility];  Service: Ophthalmology;  Laterality: Left;  CDE: 14.68   CATARACT EXTRACTION W/PHACO Right 01/08/2020   Procedure: CATARACT EXTRACTION PHACO AND INTRAOCULAR LENS PLACEMENT RIGHT EYE;  Surgeon: Harrie Agent, MD;  Location: [facility];  Service: Ophthalmology;  Laterality: Right;  CDE: 18.60   COLON SURGERY     COLONOSCOPY  07/10/2012   Procedure: COLONOSCOPY;  Surgeon: Claudis RAYMOND Rivet, MD;  Location: [facility];  Service: Endoscopy;  Laterality: N/A;  1200   COLONOSCOPY N/A 02/29/2016   Procedure: COLONOSCOPY;  Surgeon: Claudis RAYMOND Rivet, MD;  Location: [facility];  Service: Endoscopy;  Laterality: N/A;  1200 - moved to 9/6 @ 10:30   COLONOSCOPY WITH PROPOFOL  N/A 03/29/2021   Procedure: COLONOSCOPY WITH PROPOFOL ;  Surgeon: Rivet Claudis RAYMOND, MD;  Location: [facility];  Service: Endoscopy;  Laterality: N/A;  12:30   PORTACATH PLACEMENT     and Removal done   TOTAL HIP ARTHROPLASTY Right 06/02/2019   Procedure: TOTAL HIP ARTHROPLASTY ANTERIOR APPROACH;  Surgeon: Ernie Cough, MD;  Location: [facility];  Service: Orthopedics;  Laterality: Right;  70 mins   TOTAL KNEE ARTHROPLASTY Right 03/31/2024   Procedure: ARTHROPLASTY, KNEE, TOTAL;  Surgeon: Ernie Cough, MD;  Location: [facility];  Service: Orthopedics;  Laterality: Right;    Prior to Admission medications  Medication Sig Start Date End Date Taking? Authorizing Provider  acetaminophen  (TYLENOL ) 500 MG tablet Take 1,000 mg by mouth every 8 (eight) hours as needed for moderate pain (pain score 4-6).  Yes [provider]  amLODipine (NORVASC) 10 MG tablet Take 10 mg by mouth daily.   Yes [provider]  Artificial Tear Solution (SOOTHE XP) SOLN Place 1-2 drops into both eyes as needed (dry eyes).   Yes [provider]  lisinopril-hydrochlorothiazide (ZESTORETIC) 10-12.5 MG tablet Take 1 tablet  by mouth daily. 12/11/22  Yes [provider]  simvastatin (ZOCOR) 40 MG tablet Take 40 mg by mouth daily.   Yes [provider]  tamsulosin  (FLOMAX ) 0.4 MG CAPS capsule Take 1 capsule (0.4 mg total) by mouth daily. 06/06/23  Yes Watt Rush, MD    Allergies[1]  Social History   Socioeconomic History   Marital status: Married    Spouse name: Not on file   Number of children: Not on file   Years of education: Not on file   Highest education level: Not on file  Occupational History   Not on file  Tobacco Use   Smoking status: Former    Current packs/day: 0.00    Types: Cigarettes    Quit date: 01/22/1973    Years since quitting: 51.5   Smokeless tobacco: Former    Types: Chew, Snuff  Vaping Use   Vaping status: Never Used  Substance and Sexual Activity   Alcohol use: Yes    Comment: occasional glass of wine or beer   Drug use: No   Sexual activity: Not Currently  Other Topics Concern   Not on file  Social History Narrative   Not on file   Social Drivers of Health   Tobacco Use: Medium Risk (07/14/2024)   Patient History    Smoking Tobacco Use: Former    Smokeless Tobacco Use: Former    Passive Exposure: Not on Stage Manager: Not on Ship Broker Insecurity: Not on file  Transportation Needs: Not on file  Physical Activity: Not on file  Stress: Not on file  Social Connections: Not on file  Intimate Partner Violence: Not on file  Depression (PHQ2-9): Not on file  Alcohol Screen: Not on file  Housing: Not on file  Utilities: Not on file  Health Literacy: Not on file    Tobacco Use: Medium Risk (07/14/2024)   Patient History    Smoking Tobacco Use: Former    Smokeless Tobacco Use: Former    Passive Exposure: Not on file   Social History   Substance and Sexual Activity  Alcohol Use Yes   Comment: occasional glass of wine or beer    Family History  Problem Relation Age of Onset   Cancer Father     Review Of  Systems: Constitutional: Constitutional: no fever, chills, night sweats, or significant weight loss. Cardiovascular: Cardiovascular: no palpitations or chest pain. Respiratory: Respiratory: no cough or shortness of breath and No COPD. Gastrointestinal: Gastrointestinal: no vomiting or nausea. Musculoskeletal: Musculoskeletal: Joint Pain and swelling in Joints. Neurologic: Neurologic: no numbness, tingling, or difficulty with balance.  Objective:  Physical Exam: Right knee exam: The surgical incision is well-healed without signs of infection He lacks less than 5 degrees of active and passive extension and flexes to 120 degrees No lower extremity edema, erythema or calf tenderness Left knee exam reveals no palpable effusion, warmth erythema Genu varum associated with some 5 degree flexion contracture  Vital signs in last 24 hours: Temp:  [98.1 F (36.7 C)] 98.1 F (36.7 C) (01/20 0553) Pulse Rate:  [89] 89 (01/20 0553) Resp:  [16] 16 (01/20 0553) BP: (120)/(62) 120/62 (01/20 0553) SpO2:  [  97 %] 97 % (01/20 0553) Weight:  [72.3 kg] 72.3 kg (01/20 0544)  Imaging Review Plain radiographs demonstrate severe degenerative joint disease of the left knee.  The bone quality appears to be adequate for age and reported activity level.  Assessment/Plan:  End stage arthritis, left knee   The patient history, physical examination, clinical judgment of the provider and imaging studies are consistent with end stage degenerative joint disease of the left knee and total knee arthroplasty is deemed medically necessary. The treatment options including medical management, injection therapy arthroscopy and arthroplasty were discussed at length. The risks and benefits of total knee arthroplasty were presented and reviewed. The risks due to aseptic loosening, infection, stiffness, patella tracking problems, thromboembolic complications and other imponderables were discussed. The patient acknowledged the  explanation, agreed to proceed with the plan and consent was signed. Patient is being admitted for inpatient treatment for surgery, pain control, PT, OT, prophylactic antibiotics, VTE prophylaxis, progressive ambulation and ADLs and discharge planning. The patient is planning to be discharged home.   Patient's anticipated LOS is less than 2 midnights, meeting these requirements: - Younger than 31 - Lives within 1 hour of care - Has a competent adult at home to recover with post-op recover - NO history of  - Chronic pain requiring opiods  - Diabetes  - Coronary Artery Disease  - Heart failure  - Heart attack  - Stroke  - DVT/VTE  - Cardiac arrhythmia  - Respiratory Failure/COPD  - Renal failure  - Anemia  - Advanced Liver disease    Rosina Calin, PA-C Orthopedic Surgery [facility] [place] [phone]      [1]  Allergies Allergen Reactions   Oxycodone  Rash

## 2024-07-14 NOTE — Interval H&P Note (Signed)
 History and Physical Interval Note:  07/14/2024 6:54 AM  Walter Orozco  has presented today for surgery, with the diagnosis of Left knee osteoarthritis.  The various methods of treatment have been discussed with the patient and family. After consideration of risks, benefits and other options for treatment, the patient has consented to  Procedures: ARTHROPLASTY, KNEE, TOTAL (Left) as a surgical intervention.  The patient's history has been reviewed, patient examined, no change in status, stable for surgery.  I have reviewed the patient's chart and labs.  Questions were answered to the patient's satisfaction.     [name]

## 2024-07-14 NOTE — Op Note (Signed)
 " NAME:  Walter Orozco                      MEDICAL RECORD NO.:  [id number]                             FACILITY:  [facility]      PHYSICIAN:  Donnice BIRCH. Ernie, M.D.  DATE OF BIRTH:  [birth year]      DATE OF PROCEDURE:  07/14/2024                                     OPERATIVE REPORT         PREOPERATIVE DIAGNOSIS:  left knee osteoarthritis.      POSTOPERATIVE DIAGNOSIS:  left knee osteoarthritis.      FINDINGS:  The patient was noted to have complete loss of cartilage and   bone-on-bone arthritis with associated osteophytes in the medial and patellofemoral compartments of   the knee with a significant synovitis and associated effusion.  The patient had failed months of conservative treatment including medications, injection therapy, activity modification.     PROCEDURE:  left total knee replacement.      COMPONENTS USED:  DePuy Attune fixed bearing cruciate retaining medial stabilized knee   system, a size 5 femur, 5 tibia, size 7 mm CR MS AOX insert, and 38 anatomic patellar   button.      SURGEON:  Donnice BIRCH. Ernie, M.D.      ASSISTANT:  Rosina Calin, PA-C.      ANESTHESIA:  Regional and Spinal.      SPECIMENS:  None.      COMPLICATION:  None.      DRAINS:  None.  EBL: 150cc      TOURNIQUET TIME:  tourniquet was not used      The patient was stable to the recovery room.      INDICATION FOR PROCEDURE:  Walter Orozco is a 79 y.o. male patient of   mine.  The patient had been seen, evaluated, and treated for months conservatively in the   office with medication, activity modification, and injections.  The patient had   radiographic changes of complete loss of joint space with endplate sclerosis and osteophytes noted.  Based on the radiographic changes and failed conservative measures, the patient   decided to proceed with total knee replacement as definitive treatment.  Risks of infection, DVT, component failure, stiffness and the need for revision surgery, neurovascular injury  were reviewed in the office setting.  The postop course was reviewed stressing the efforts to maximize post-operative range of motion, satisfaction and function.  Consent was obtained for benefit of pain   relief.      PROCEDURE IN DETAIL:  The patient was brought to the operative theater.   Once adequate anesthesia, preoperative antibiotics, 2 gm of Ancef ,1 gm of Tranexamic Acid , and 10 mg of Decadron  administered, the patient was positioned supine with bony prominences padded and protected.  The  left lower extremity was prepped and draped in sterile fashion.  A time-   out was performed identifying the patient, planned procedure, and the appropriate extremity.      The left lower extremity was placed in the [facility] leg holder.  A midline incision was   made followed by median parapatellar arthrotomy.  Following initial   exposure, attention  was first directed to the patella.  Precut   measurement was noted to be 25 mm.  I resected down to 14 mm and used a   38 anatomic patellar button to restore patellar height as well as cover the cut surface.  Limited lateral facetecomy was performed.     The lug holes were drilled and a metal shim was placed to protect the   patella from retractors and saw blade during the procedure.      At this point, attention was now directed to the femur.  The femoral   canal was opened with a drill, irrigated to try to prevent fat emboli.  An   intramedullary rod was passed at 5 degrees valgus, 9 mm of bone was   resected off the distal femur.  Following this resection, the tibia was   subluxated anteriorly.  Using the extramedullary guide, 2 mm of bone was resected off   the proximal medial tibia.  We confirmed the gap would be   stable medially and laterally with a size 5 spacer block as well as confirmed that the tibial cut was perpendicular in the coronal plane, checking with an alignment rod.      Once this was done, I sized the femur to be a size 5 in the  anterior-   posterior dimension and chose a standard component based on medial and   lateral dimension.  The size 5 rotation block was then pinned in   position anterior referenced using the C-clamp to set rotation.  The   anterior, posterior, and  chamfer cuts were made without difficulty nor   notching making certain that I was along the anterior cortex to help   with flexion gap stability.      The final femoral shim cut was made off the lateral aspect of distal femur.      At this point, the tibia was sized to be a size 5.  The size 5 tray was   then pinned in position through the medial third of the tubercle,   drilled, and keel punched.  Trial reduction was now carried with a 5 femur,  5 tibia, a size 7 mm CR insert, and the 38 anatomic patella botton.  The knee was brought to full extension with good flexion stability with the patella tracking through the trochlea without application of pressure.  Given   all these findings the trial components removed.  Final components were   opened and cement was mixed.  The knee was irrigated with normal saline solution and pulse lavage.  The posterior synovial capsule was then injected with 30 cc of 0.25% Marcaine  with epinephrine , 1 cc of Toradol  and 30 cc of NS for a total of 61 cc.     Final implants were then cemented onto cleaned and dried cut surfaces of bone with the knee brought to extension with a size 7 mm CR trial insert.      Once the cement had fully cured, excessive cement was removed   throughout the knee.  I confirmed that I was satisfied with the range of   motion and stability, and the final size 7 mm CR MS AOX insert was chosen and it was impacted into the tibial tray.     At this point in the case no significant   hemostasis was required.  The extensor mechanism was then reapproximated using #1 Vicryl and #1 Stratafix sutures with the knee   in flexion.  The  remaining wound was closed with 2-0 Vicryl and running 4-0  Monocryl.   The knee was cleaned, dried, dressed sterilely using Dermabond and   Aquacel dressing.  The patient was then   brought to recovery room in stable condition, tolerating the procedure   well.   Please note that PA Patti was present for the entirety of the case, and was utilized for pre-operative positioning, peri-operative retractor management, general facilitation of the procedure and for primary wound closure at the end of the case.              Donnice CORDOBA Ernie, M.D.    07/14/2024 8:30 AM "

## 2024-07-14 NOTE — Anesthesia Procedure Notes (Signed)
 Procedure Name: MAC Date/Time: 07/14/2024 7:14 AM  Performed by: Memory Armida LABOR, CRNAPre-anesthesia Checklist: Patient identified, Emergency Drugs available, Suction available, Timeout performed and Patient being monitored Patient Re-evaluated:Patient Re-evaluated prior to induction Oxygen Delivery Method: Simple face mask Placement Confirmation: positive ETCO2 Dental Injury: Teeth and Oropharynx as per pre-operative assessment

## 2024-07-15 ENCOUNTER — Encounter (HOSPITAL_COMMUNITY): Payer: Self-pay | Admitting: Orthopedic Surgery

## 2024-07-16 NOTE — Anesthesia Postprocedure Evaluation (Signed)
"   Anesthesia Post Note  Patient: Walter Orozco  Procedure(s) Performed: ARTHROPLASTY, KNEE, TOTAL (Left: Knee)     Patient location during evaluation: PACU Anesthesia Type: Spinal, MAC and Regional Level of consciousness: awake and alert Pain management: pain level controlled Vital Signs Assessment: post-procedure vital signs reviewed and stable Respiratory status: spontaneous breathing, nonlabored ventilation and respiratory function stable Cardiovascular status: blood pressure returned to baseline and stable Postop Assessment: no apparent nausea or vomiting Anesthetic complications: no   No notable events documented.                [name]      "

## 2024-11-09 ENCOUNTER — Ambulatory Visit: Admitting: Urology
